# Patient Record
Sex: Female | Born: 1950 | Race: White | Hispanic: No | Marital: Married | Smoking: Current every day smoker
Health system: Southern US, Community
[De-identification: ages and names within clinical notes are randomized; demographics above are authoritative.]

## PROBLEM LIST (undated history)

## (undated) DIAGNOSIS — S3210XA Unspecified fracture of sacrum, initial encounter for closed fracture: Secondary | ICD-10-CM

## (undated) DIAGNOSIS — M4800 Spinal stenosis, site unspecified: Secondary | ICD-10-CM

## (undated) DIAGNOSIS — K219 Gastro-esophageal reflux disease without esophagitis: Secondary | ICD-10-CM

## (undated) DIAGNOSIS — I10 Essential (primary) hypertension: Secondary | ICD-10-CM

## (undated) DIAGNOSIS — J449 Chronic obstructive pulmonary disease, unspecified: Secondary | ICD-10-CM

## (undated) DIAGNOSIS — I639 Cerebral infarction, unspecified: Secondary | ICD-10-CM

## (undated) HISTORY — PX: CHOLECYSTECTOMY: SHX55

## (undated) HISTORY — PX: ABDOMINAL HYSTERECTOMY: SHX81

## (undated) HISTORY — PX: NISSEN FUNDOPLICATION: SHX2091

## (undated) HISTORY — PX: HERNIA REPAIR: SHX51

---

## 1998-10-25 HISTORY — PX: LEFT HEART CATH AND CORONARY ANGIOGRAPHY: CATH118249

## 2004-01-23 DIAGNOSIS — Z79891 Long term (current) use of opiate analgesic: Secondary | ICD-10-CM | POA: Insufficient documentation

## 2004-11-15 ENCOUNTER — Emergency Department: Payer: Self-pay | Admitting: Emergency Medicine

## 2006-08-30 ENCOUNTER — Inpatient Hospital Stay: Payer: Self-pay | Admitting: Internal Medicine

## 2006-08-30 ENCOUNTER — Other Ambulatory Visit: Payer: Self-pay

## 2010-12-01 DIAGNOSIS — K2270 Barrett's esophagus without dysplasia: Secondary | ICD-10-CM | POA: Insufficient documentation

## 2012-02-21 DIAGNOSIS — E860 Dehydration: Secondary | ICD-10-CM | POA: Insufficient documentation

## 2012-11-02 DIAGNOSIS — J449 Chronic obstructive pulmonary disease, unspecified: Secondary | ICD-10-CM | POA: Insufficient documentation

## 2012-11-02 DIAGNOSIS — F17200 Nicotine dependence, unspecified, uncomplicated: Secondary | ICD-10-CM | POA: Insufficient documentation

## 2012-12-04 DIAGNOSIS — R911 Solitary pulmonary nodule: Secondary | ICD-10-CM | POA: Insufficient documentation

## 2013-05-02 DIAGNOSIS — F329 Major depressive disorder, single episode, unspecified: Secondary | ICD-10-CM | POA: Diagnosis present

## 2013-05-02 DIAGNOSIS — F32A Depression, unspecified: Secondary | ICD-10-CM | POA: Diagnosis present

## 2013-11-01 DIAGNOSIS — M533 Sacrococcygeal disorders, not elsewhere classified: Secondary | ICD-10-CM | POA: Insufficient documentation

## 2015-10-18 ENCOUNTER — Inpatient Hospital Stay
Admission: EM | Admit: 2015-10-18 | Discharge: 2015-10-20 | DRG: 190 | Disposition: A | Discharge status: Home / Self Care | Payer: Medicare Other | Attending: Internal Medicine | Admitting: Internal Medicine

## 2015-10-18 ENCOUNTER — Emergency Department: Payer: Medicare Other

## 2015-10-18 ENCOUNTER — Encounter: Payer: Self-pay | Admitting: Emergency Medicine

## 2015-10-18 DIAGNOSIS — J440 Chronic obstructive pulmonary disease with acute lower respiratory infection: Secondary | ICD-10-CM | POA: Diagnosis present

## 2015-10-18 DIAGNOSIS — Z881 Allergy status to other antibiotic agents status: Secondary | ICD-10-CM

## 2015-10-18 DIAGNOSIS — F17210 Nicotine dependence, cigarettes, uncomplicated: Secondary | ICD-10-CM | POA: Diagnosis present

## 2015-10-18 DIAGNOSIS — Z880 Allergy status to penicillin: Secondary | ICD-10-CM

## 2015-10-18 DIAGNOSIS — J441 Chronic obstructive pulmonary disease with (acute) exacerbation: Secondary | ICD-10-CM | POA: Diagnosis present

## 2015-10-18 DIAGNOSIS — J181 Lobar pneumonia, unspecified organism: Secondary | ICD-10-CM

## 2015-10-18 DIAGNOSIS — E876 Hypokalemia: Secondary | ICD-10-CM | POA: Diagnosis present

## 2015-10-18 DIAGNOSIS — Z79899 Other long term (current) drug therapy: Secondary | ICD-10-CM | POA: Diagnosis not present

## 2015-10-18 DIAGNOSIS — J189 Pneumonia, unspecified organism: Secondary | ICD-10-CM | POA: Diagnosis present

## 2015-10-18 DIAGNOSIS — E871 Hypo-osmolality and hyponatremia: Secondary | ICD-10-CM | POA: Diagnosis present

## 2015-10-18 DIAGNOSIS — I10 Essential (primary) hypertension: Secondary | ICD-10-CM | POA: Diagnosis present

## 2015-10-18 HISTORY — DX: Chronic obstructive pulmonary disease, unspecified: J44.9

## 2015-10-18 LAB — LACTIC ACID, PLASMA
LACTIC ACID, VENOUS: 1.1 mmol/L (ref 0.5–2.0)
Lactic Acid, Venous: 1.4 mmol/L (ref 0.5–2.0)

## 2015-10-18 LAB — BASIC METABOLIC PANEL
ANION GAP: 9 (ref 5–15)
BUN: 7 mg/dL (ref 6–20)
CHLORIDE: 93 mmol/L — AB (ref 101–111)
CO2: 30 mmol/L (ref 22–32)
Calcium: 8.3 mg/dL — ABNORMAL LOW (ref 8.9–10.3)
Creatinine, Ser: 0.53 mg/dL (ref 0.44–1.00)
GFR calc non Af Amer: >60 mL/min (ref >60)
Glucose, Bld: 106 mg/dL — ABNORMAL HIGH (ref 65–99)
POTASSIUM: 2.2 mmol/L — AB (ref 3.5–5.1)
SODIUM: 132 mmol/L — AB (ref 135–145)

## 2015-10-18 LAB — CBC WITH DIFFERENTIAL/PLATELET
BASOS PCT: 0 %
Basophils Absolute: 0 10*3/uL (ref 0–0.1)
EOS ABS: 0 10*3/uL (ref 0–0.7)
Eosinophils Relative: 0 %
HCT: 38.1 % (ref 35.0–47.0)
HEMOGLOBIN: 13.1 g/dL (ref 12.0–16.0)
LYMPHS ABS: 2 10*3/uL (ref 1.0–3.6)
Lymphocytes Relative: 17 %
MCH: 31.4 pg (ref 26.0–34.0)
MCHC: 34.3 g/dL (ref 32.0–36.0)
MCV: 91.6 fL (ref 80.0–100.0)
Monocytes Absolute: 1.5 10*3/uL — ABNORMAL HIGH (ref 0.2–0.9)
Monocytes Relative: 13 %
NEUTROS PCT: 70 %
Neutro Abs: 8.5 10*3/uL — ABNORMAL HIGH (ref 1.4–6.5)
Platelets: 571 10*3/uL — ABNORMAL HIGH (ref 150–440)
RBC: 4.16 MIL/uL (ref 3.80–5.20)
RDW: 14.3 % (ref 11.5–14.5)
WBC: 12.1 10*3/uL — AB (ref 3.6–11.0)

## 2015-10-18 LAB — TROPONIN I: TROPONIN I: 0.04 ng/mL — AB (ref <0.031)

## 2015-10-18 LAB — MAGNESIUM: MAGNESIUM: 1.4 mg/dL — AB (ref 1.7–2.4)

## 2015-10-18 MED ORDER — SODIUM CHLORIDE 0.9 % IV BOLUS (SEPSIS)
1000.0000 mL | Freq: Once | INTRAVENOUS | Status: AC
  Administered 2015-10-18: 1000 mL via INTRAVENOUS

## 2015-10-18 MED ORDER — OXYCODONE HCL ER 20 MG PO T12A
20.0000 mg | EXTENDED_RELEASE_TABLET | Freq: Three times a day (TID) | ORAL | Status: DC
  Administered 2015-10-18 – 2015-10-20 (×5): 20 mg via ORAL
  Filled 2015-10-18 (×5): qty 1

## 2015-10-18 MED ORDER — PANTOPRAZOLE SODIUM 40 MG PO TBEC
40.0000 mg | DELAYED_RELEASE_TABLET | Freq: Every day | ORAL | Status: DC
  Administered 2015-10-18 – 2015-10-20 (×3): 40 mg via ORAL
  Filled 2015-10-18 (×3): qty 1

## 2015-10-18 MED ORDER — ALBUTEROL SULFATE HFA 108 (90 BASE) MCG/ACT IN AERS: 2.0000 | INHALATION_SPRAY | Freq: Four times a day (QID) | RESPIRATORY_TRACT | Status: DC | PRN

## 2015-10-18 MED ORDER — NICOTINE 21 MG/24HR TD PT24
21.0000 mg | MEDICATED_PATCH | Freq: Every day | TRANSDERMAL | Status: DC
  Administered 2015-10-18 – 2015-10-20 (×3): 21 mg via TRANSDERMAL
  Filled 2015-10-18 (×3): qty 1

## 2015-10-18 MED ORDER — METHYLPREDNISOLONE SODIUM SUCC 125 MG IJ SOLR
60.0000 mg | Freq: Two times a day (BID) | INTRAMUSCULAR | Status: DC
  Administered 2015-10-18 – 2015-10-19 (×3): 60 mg via INTRAVENOUS
  Filled 2015-10-18 (×4): qty 2

## 2015-10-18 MED ORDER — POTASSIUM CHLORIDE 10 MEQ/100ML IV SOLN
10.0000 meq | INTRAVENOUS | Status: AC
  Administered 2015-10-18 (×4): 10 meq via INTRAVENOUS
  Filled 2015-10-18 (×5): qty 100

## 2015-10-18 MED ORDER — ENOXAPARIN SODIUM 40 MG/0.4ML ~~LOC~~ SOLN
40.0000 mg | SUBCUTANEOUS | Status: DC
  Administered 2015-10-18 – 2015-10-19 (×2): 40 mg via SUBCUTANEOUS
  Filled 2015-10-18 (×2): qty 0.4

## 2015-10-18 MED ORDER — AMLODIPINE BESYLATE 5 MG PO TABS
2.5000 mg | ORAL_TABLET | Freq: Every day | ORAL | Status: DC
  Administered 2015-10-19 – 2015-10-20 (×2): 2.5 mg via ORAL
  Filled 2015-10-18 (×3): qty 1

## 2015-10-18 MED ORDER — ONDANSETRON HCL 4 MG/2ML IJ SOLN
4.0000 mg | Freq: Once | INTRAMUSCULAR | Status: AC
  Administered 2015-10-18: 4 mg via INTRAVENOUS
  Filled 2015-10-18: qty 2

## 2015-10-18 MED ORDER — LEVOFLOXACIN 500 MG PO TABS: 750.0000 mg | ORAL_TABLET | Freq: Two times a day (BID) | ORAL | Status: DC

## 2015-10-18 MED ORDER — SODIUM CHLORIDE 0.9 % IV SOLN
INTRAVENOUS | Status: AC
  Administered 2015-10-18 – 2015-10-19 (×2): via INTRAVENOUS

## 2015-10-18 MED ORDER — LEVOFLOXACIN IN D5W 750 MG/150ML IV SOLN
750.0000 mg | Freq: Once | INTRAVENOUS | Status: AC
  Administered 2015-10-18: 750 mg via INTRAVENOUS
  Filled 2015-10-18: qty 150

## 2015-10-18 MED ORDER — IPRATROPIUM-ALBUTEROL 0.5-2.5 (3) MG/3ML IN SOLN
3.0000 mL | Freq: Four times a day (QID) | RESPIRATORY_TRACT | Status: DC
  Administered 2015-10-19 (×4): 3 mL via RESPIRATORY_TRACT
  Filled 2015-10-18 (×4): qty 3

## 2015-10-18 MED ORDER — POTASSIUM CHLORIDE 10 MEQ/100ML IV SOLN
10.0000 meq | Freq: Once | INTRAVENOUS | Status: AC
  Administered 2015-10-18: 10 meq via INTRAVENOUS
  Filled 2015-10-18: qty 100

## 2015-10-18 MED ORDER — POTASSIUM CHLORIDE CRYS ER 20 MEQ PO TBCR
40.0000 meq | EXTENDED_RELEASE_TABLET | Freq: Once | ORAL | Status: AC
  Administered 2015-10-18: 40 meq via ORAL
  Filled 2015-10-18: qty 2

## 2015-10-18 MED ORDER — POTASSIUM CHLORIDE 20 MEQ PO PACK
40.0000 meq | PACK | Freq: Once | ORAL | Status: DC
  Filled 2015-10-18: qty 2

## 2015-10-18 MED ORDER — POTASSIUM CHLORIDE CRYS ER 20 MEQ PO TBCR
EXTENDED_RELEASE_TABLET | ORAL | Status: AC
  Filled 2015-10-18: qty 2

## 2015-10-18 MED ORDER — GABAPENTIN 300 MG PO CAPS
600.0000 mg | ORAL_CAPSULE | Freq: Three times a day (TID) | ORAL | Status: DC
  Administered 2015-10-18 – 2015-10-20 (×5): 600 mg via ORAL
  Filled 2015-10-18 (×5): qty 2

## 2015-10-18 MED ORDER — MAGNESIUM SULFATE 2 GM/50ML IV SOLN
2.0000 g | Freq: Once | INTRAVENOUS | Status: AC
  Administered 2015-10-18: 2 g via INTRAVENOUS
  Filled 2015-10-18: qty 50

## 2015-10-18 MED ORDER — ONDANSETRON HCL 4 MG/2ML IJ SOLN
4.0000 mg | Freq: Four times a day (QID) | INTRAMUSCULAR | Status: DC | PRN
  Administered 2015-10-18 – 2015-10-20 (×3): 4 mg via INTRAVENOUS
  Filled 2015-10-18 (×4): qty 2

## 2015-10-18 MED ORDER — ZOLPIDEM TARTRATE 5 MG PO TABS
5.0000 mg | ORAL_TABLET | Freq: Once | ORAL | Status: AC
  Administered 2015-10-18: 5 mg via ORAL
  Filled 2015-10-18: qty 1

## 2015-10-18 MED ORDER — HYDROCHLOROTHIAZIDE 25 MG PO TABS
25.0000 mg | ORAL_TABLET | Freq: Every day | ORAL | Status: DC
  Administered 2015-10-19 – 2015-10-20 (×2): 25 mg via ORAL
  Filled 2015-10-18 (×3): qty 1

## 2015-10-18 MED ORDER — ASPIRIN 81 MG PO CHEW
324.0000 mg | CHEWABLE_TABLET | Freq: Once | ORAL | Status: AC
  Administered 2015-10-18: 324 mg via ORAL
  Filled 2015-10-18: qty 4

## 2015-10-18 MED ORDER — POTASSIUM CHLORIDE CRYS ER 20 MEQ PO TBCR
40.0000 meq | EXTENDED_RELEASE_TABLET | Freq: Once | ORAL | Status: AC
  Administered 2015-10-18: 40 meq via ORAL

## 2015-10-18 MED ORDER — MONTELUKAST SODIUM 10 MG PO TABS
10.0000 mg | ORAL_TABLET | ORAL | Status: DC
  Administered 2015-10-19 – 2015-10-20 (×2): 10 mg via ORAL
  Filled 2015-10-18 (×2): qty 1

## 2015-10-18 MED ORDER — LEVOFLOXACIN IN D5W 500 MG/100ML IV SOLN
500.0000 mg | INTRAVENOUS | Status: DC
  Administered 2015-10-19: 18:00:00 500 mg via INTRAVENOUS
  Filled 2015-10-18 (×2): qty 100

## 2015-10-18 MED ORDER — ALBUTEROL SULFATE (2.5 MG/3ML) 0.083% IN NEBU
2.5000 mg | INHALATION_SOLUTION | RESPIRATORY_TRACT | Status: DC | PRN
  Administered 2015-10-20: 06:00:00 2.5 mg via RESPIRATORY_TRACT
  Filled 2015-10-18: qty 3

## 2015-10-18 MED ORDER — HYDROCODONE-ACETAMINOPHEN 7.5-325 MG PO TABS
1.0000 | ORAL_TABLET | Freq: Four times a day (QID) | ORAL | Status: DC
  Administered 2015-10-18 – 2015-10-20 (×6): 1 via ORAL
  Filled 2015-10-18 (×6): qty 1

## 2015-10-18 MED ORDER — TIOTROPIUM BROMIDE MONOHYDRATE 18 MCG IN CAPS
18.0000 ug | ORAL_CAPSULE | Freq: Every day | RESPIRATORY_TRACT | Status: DC
  Administered 2015-10-19 – 2015-10-20 (×2): 18 ug via RESPIRATORY_TRACT
  Filled 2015-10-18: qty 5

## 2015-10-18 NOTE — Progress Notes (Signed)
ANTIBIOTIC CONSULT NOTE - INITIAL  Pharmacy Consult for Levaquin  Indication: CAP   Allergies  Allergen Reactions  . Ciprofloxacin Other (See Comments)    Altered mental status.  Marland Kitchen. Penicillins Other (See Comments)    Unknown reaction    Patient Measurements: Height: 5\' 4"  (162.6 cm) Weight: 115 lb (52.164 kg) IBW/kg (Calculated) : 54.7 Adjusted Body Weight:   Vital Signs: Temp: 97.8 F (36.6 C) (12/24 1112) Temp Source: Oral (12/24 1112) BP: 151/81 mmHg (12/24 1930) Pulse Rate: 90 (12/24 1930) Intake/Output from previous day:   Intake/Output from this shift:    Labs:  Recent Labs  10/18/15 1237  WBC 12.1*  HGB 13.1  PLT 571*  CREATININE 0.53   Estimated Creatinine Clearance: 58.5 mL/min (by C-G formula based on Cr of 0.53). No results for input(s): VANCOTROUGH, VANCOPEAK, VANCORANDOM, GENTTROUGH, GENTPEAK, GENTRANDOM, TOBRATROUGH, TOBRAPEAK, TOBRARND, AMIKACINPEAK, AMIKACINTROU, AMIKACIN in the last 72 hours.   Microbiology: No results found for this or any previous visit (from the past 720 hour(s)).  Medical History: Past Medical History  Diagnosis Date  . COPD (chronic obstructive pulmonary disease) (HCC)     Medications:  Scheduled:  . ipratropium-albuterol  3 mL Nebulization Q6H  . nicotine  21 mg Transdermal Daily   Assessment: Pharmacy consulted to dose levaquin in this 64 year old female admitted with CAP.  CrCl = 58.5 ml/min  Goal of Therapy:  resolution of infection  Plan:  Expected duration 7 days with resolution of temperature and/or normalization of WBC   Levaquin 750 mg IV X 1 given on 12/24. Levaquin 500 mg IV Q24H ordered to start 12/25.  No dose adjustment needed.  Lincy Belles D 10/18/2015,8:09 PM

## 2015-10-18 NOTE — ED Notes (Signed)
Pt here with c/o sob for a few days now, coughing with yellow sputum "with black spots in it", pt with labored breathing, sats 91% on ra, pt states her norm is around 90%.

## 2015-10-18 NOTE — H&P (Signed)
[facility] [place] at [facility]   PATIENT NAME: Pam Johnson    MR#:  [id number]  DATE OF BIRTH:  1951/06/13  DATE OF ADMISSION:  10/18/2015  PRIMARY CARE PHYSICIAN: No primary care provider on file.   REQUESTING/REFERRING PHYSICIAN: Dr. Shaune Pollack  CHIEF COMPLAINT:   Shortness of breath HISTORY OF PRESENT ILLNESS:  Pam Johnson  is a 64 y.o. female with a known history of COPD and still continues to smoke is presenting to the ED with a chief complaint of shortness of breath. Patient is having productive cough with yellow phlegm since Tuesday. Denies any fever or dizziness. Denies any sick contacts. Came into the ED chest x-ray has revealed right upper lobe pneumonia. Patient is started on IV levofloxacin and hospitalist team is called to admit the patient. Patient's potassium is at 2.2 and magnesium is at 1.4. Patient has received 10 mEq of IV potassium chloride and 40 mg of by mouth potassium in the ED. Denies any chest pain during my examination. No similar complaints in the past. EKG with no acute changes  PAST MEDICAL HISTORY:   Past Medical History  Diagnosis Date  . COPD (chronic obstructive pulmonary disease) (HCC)     PAST SURGICAL HISTOIRY:   Past Surgical History  Procedure Laterality Date  . Cholecystectomy    . Hernia repair    . Abdominal hysterectomy      SOCIAL HISTORY:   Social History  Substance Use Topics  . Smoking status: Current Every Day Smoker -- 1.00 packs/day    Types: Cigarettes  . Smokeless tobacco: Not on file  . Alcohol Use: No    FAMILY HISTORY:  No family history on file.  DRUG ALLERGIES:   Allergies  Allergen Reactions  . Ciprofloxacin Other (See Comments)    Altered mental status.  Marland Kitchen Penicillins Other (See Comments)    Unknown reaction    REVIEW OF SYSTEMS:  CONSTITUTIONAL: No fever, fatigue or weakness.  EYES: No blurred or double vision.  EARS, NOSE, AND THROAT: No tinnitus or ear pain.   RESPIRATORY: Reports productive cough, shortness of breath,  wheezing, denies   hemoptysis.  CARDIOVASCULAR: No chest pain, orthopnea, edema.  GASTROINTESTINAL: No nausea, vomiting, diarrhea or abdominal pain.  GENITOURINARY: No dysuria, hematuria.  ENDOCRINE: No polyuria, nocturia,  HEMATOLOGY: No anemia, easy bruising or bleeding SKIN: No rash or lesion. MUSCULOSKELETAL: No joint pain or arthritis.   NEUROLOGIC: No tingling, numbness, weakness.  PSYCHIATRY: No anxiety or depression.   MEDICATIONS AT HOME:   Prior to Admission medications   Medication Sig Start Date End Date Taking? Authorizing Provider  albuterol (PROVENTIL HFA;VENTOLIN HFA) 108 (90 BASE) MCG/ACT inhaler Inhale 2 puffs into the lungs every 6 (six) hours as needed for wheezing or shortness of breath.   Yes Historical Provider, MD  amLODipine (NORVASC) 2.5 MG tablet Take 2.5 mg by mouth daily.   Yes Historical Provider, MD  esomeprazole (NEXIUM) 40 MG capsule Take 40 mg by mouth 2 (two) times daily.   Yes Historical Provider, MD  Fluticasone-Salmeterol (ADVAIR) 500-50 MCG/DOSE AEPB Inhale 1 puff into the lungs 2 (two) times daily.   Yes Historical Provider, MD  gabapentin (NEURONTIN) 600 MG tablet Take 600 mg by mouth 3 (three) times daily.   Yes Historical Provider, MD  hydrochlorothiazide (HYDRODIURIL) 25 MG tablet Take 25 mg by mouth daily.   Yes Historical Provider, MD  HYDROcodone-acetaminophen (NORCO) 7.5-325 MG tablet Take 1 tablet by mouth 4 (four) times daily.  Yes Historical Provider, MD  montelukast (SINGULAIR) 10 MG tablet Take 10 mg by mouth every morning.   Yes Historical Provider, MD  oxyCODONE (OXYCONTIN) 20 mg 12 hr tablet Take 20 mg by mouth 3 (three) times daily.   Yes Historical Provider, MD  tiotropium (SPIRIVA) 18 MCG inhalation capsule Place 18 mcg into inhaler and inhale daily.   Yes Historical Provider, MD  levofloxacin (LEVAQUIN) 500 MG tablet Take 1.5 tablets (750 mg total) by mouth 2 (two)  times daily. 10/18/15   Governor Rooksebecca Lord, MD      VITAL SIGNS:  Blood pressure 154/84, pulse 91, temperature 97.8 F (36.6 C), temperature source Oral, resp. rate 13, height 5\' 4"  (1.626 m), weight 52.164 kg (115 lb), SpO2 92 %.  PHYSICAL EXAMINATION:  GENERAL:  64 y.o.-year-old patient lying in the bed with no acute distress.  EYES: Pupils equal, round, reactive to light and accommodation. No scleral icterus. Extraocular muscles intact.  HEENT: Head atraumatic, normocephalic. Oropharynx and nasopharynx clear.  NECK:  Supple, no jugular venous distention. No thyroid enlargement, no tenderness.  LUNGS:  moderate breath sounds bilaterally, right upper lobe crackling,  diffuse  wheezing,  No rales,rhonchi or crepitation. No use of accessory muscles of respiration.  CARDIOVASCULAR: S1, S2 normal. No murmurs, rubs, or gallops.  ABDOMEN: Soft, nontender, nondistended. Bowel sounds present. No organomegaly or mass.  EXTREMITIES: No pedal edema, cyanosis, or clubbing.  NEUROLOGIC: Cranial nerves II through XII are intact. Muscle strength 5/5 in all extremities. Sensation intact. Gait not checked.  PSYCHIATRIC: The patient is alert and oriented x 3.  SKIN: No obvious rash, lesion, or ulcer.   LABORATORY PANEL:   CBC  Recent Labs Lab 10/18/15 1237  WBC 12.1*  HGB 13.1  HCT 38.1  PLT 571*   ------------------------------------------------------------------------------------------------------------------  Chemistries   Recent Labs Lab 10/18/15 1237  NA 132*  K 2.2*  CL 93*  CO2 30  GLUCOSE 106*  BUN 7  CREATININE 0.53  CALCIUM 8.3*  MG 1.4*   ------------------------------------------------------------------------------------------------------------------  Cardiac Enzymes  Recent Labs Lab 10/18/15 1237  TROPONINI 0.04*   ------------------------------------------------------------------------------------------------------------------  RADIOLOGY:  Dg Chest 2  View  10/18/2015  CLINICAL DATA:  Smoker with productive cough for 5 days. EXAM: CHEST  2 VIEW COMPARISON:  None. FINDINGS: Normal cardiac silhouette. Lungs are hyperinflated. There is segmental airspace disease in the RIGHT upper lobe, lateral segment. Lungs are hyperinflated. IMPRESSION: RIGHT upper lobe pneumonia. Electronically Signed   By: Genevive BiStewart  Edmunds M.D.   On: 10/18/2015 11:59    EKG:   Orders placed or performed during the hospital encounter of 10/18/15  . ED EKG  . ED EKG  . EKG 12-Lead  . EKG 12-Lead   EKG with no U waves, no acute ST-T wave depressions or elevations IMPRESSION AND PLAN:   1. Shortness of breath with productive cough secondary to right upper lobe pneumonia which is community-acquired Admit patient to telemetry in view of severe hypokalemia We will obtain sputum culture and sensitivity Check urine for Legionella antigen and strep pneumonia Patient will be given IV levofloxacin and nebulizer treatments   2. Mild COPD exacerbation probably triggered by right upper lobe pneumonia Will provide small dose IV Solu-Medrol and neb laser treatments.  3. Severe hypokalemia and hypomagnesemia Replete potassium and magnesium and check a.m. Labs  4. Tobacco abuse disorder  Counseled patient to quit smoking for 3-4 minutes. Patient verbalized understanding. She is ready to quit. Provide nicotine patch.  Will provide GI and DVT prophylaxis  All the records are reviewed and case discussed with ED provider. Management plans discussed with the patient and she is in agreement.  CODE STATUS: Full code, husband healthcare power of attorney  TOTAL TIME TAKING CARE OF THIS PATIENT: 45 minutes.    Ramonita Lab M.D on 10/18/2015 at 6:29 PM  Between 7am to 6pm - Pager - [phone]  After 6pm go to [email or web address] - password [email or web address] [facility]  [place] [facility]  Office  [phone]  CC: Primary care physician; No primary care provider on file.

## 2015-10-18 NOTE — ED Notes (Signed)
Patient transported to X-ray 

## 2015-10-18 NOTE — ED Provider Notes (Signed)
[facility] Emergency Department Provider Note   ____________________________________________  Time seen:  I have reviewed the triage vital signs and the triage nursing note.  HISTORY  Chief Complaint Shortness of Breath   Historian Patient and spouse  HPI Pam Johnson is a 64 y.o. female with a history of COPD, baseline O2 sat per the patient of around 90%, who is here with a complaint of coughing and productive yellow sputum since Tuesday. No fevers reported. No dizziness or lightheadedness. She has had shortness of breath. She does use albuterol and inhaled steroid at home. Her primary care physician is at [facility]. Symptoms are moderate. No chest pain. Exertion makes her dyspnea worse.    Past Medical History  Diagnosis Date  . COPD (chronic obstructive pulmonary disease) (HCC)     There are no active problems to display for this patient.   Past Surgical History  Procedure Laterality Date  . Cholecystectomy    . Hernia repair    . Abdominal hysterectomy      Current Outpatient Rx  Name  Route  Sig  Dispense  Refill  . levofloxacin (LEVAQUIN) 500 MG tablet   Oral   Take 1.5 tablets (750 mg total) by mouth 2 (two) times daily.   18 tablet   0     Allergies Review of patient's allergies indicates not on file.  No family history on file.  Social History Social History  Substance Use Topics  . Smoking status: Current Every Day Smoker -- 1.00 packs/day    Types: Cigarettes  . Smokeless tobacco: None  . Alcohol Use: No    Review of Systems  Constitutional: Negative for fever. Eyes: Negative for visual changes. ENT: Negative for sore throat. Cardiovascular: Negative for chest pain. Respiratory: Positive for shortness of breath. Gastrointestinal: Negative for abdominal pain, vomiting and diarrhea. Genitourinary: Negative for dysuria. Musculoskeletal: Negative for back pain. Skin: Negative for rash. Neurological:  Negative for headache. 10 point Review of Systems otherwise negative ____________________________________________   PHYSICAL EXAM:  VITAL SIGNS: ED Triage Vitals  Enc Vitals Group     BP 10/18/15 1112 130/83 mmHg     Pulse Rate 10/18/15 1112 70     Resp 10/18/15 1112 18     Temp 10/18/15 1112 97.8 F (36.6 C)     Temp Source 10/18/15 1112 Oral     SpO2 10/18/15 1112 91 %     Weight 10/18/15 1112 115 lb (52.164 kg)     Height 10/18/15 1112  (1.626 m)     Head Cir --      Peak Flow --      Pain Score --      Pain Loc --      Pain Edu? --      Excl. in GC? --      Constitutional: Alert and oriented. Well appearing and in no distress. Eyes: Conjunctivae are normal. PERRL. Normal extraocular movements. ENT   Head: Normocephalic and atraumatic.   Nose: No congestion/rhinnorhea.   Mouth/Throat: Mucous membranes are moist.   Neck: No stridor. Cardiovascular/Chest: Normal rate, regular rhythm.  No murmurs, rubs, or gallops. Respiratory: Normal respiratory effort without tachypnea nor retractions. Rhonchi right posterior upper and middle.  Gastrointestinal: Soft. No distention, no guarding, no rebound. Nontender  Genitourinary/rectal:Deferred Musculoskeletal: Nontender with normal range of motion in all extremities. No joint effusions.  No lower extremity tenderness.  No edema. Neurologic:  Normal speech and language. No gross or focal neurologic  deficits are appreciated. Skin:  Skin is warm, dry and intact. No rash noted. Psychiatric: Mood and affect are normal. Speech and behavior are normal. Patient exhibits appropriate insight and judgment.  ____________________________________________   EKG I, Governor Rooksebecca Estellar Cadena, MD, the attending physician have personally viewed and interpreted all ECGs.  91 bpm. Normal sinus rhythm with PVCs. Narrow QRS. Normal axis. Nonspecific ST and T-wave. ____________________________________________  LABS (pertinent  positives/negatives)  Lactic acid 1.1 White blood count 12.1 with left shift. Hemoglobin 13.1 and platelet count 571 Basic metabolic panel significant for sodium 132, potassium 2.2, chloride 93 Troponin 0.04  ____________________________________________  RADIOLOGY All Xrays were viewed by me. Imaging interpreted by Radiologist.  Chest two-view: Right upper lobe pneumonia __________________________________________  PROCEDURES  Procedure(s) performed: None  Critical Care performed: None  ____________________________________________   ED COURSE / ASSESSMENT AND PLAN  CONSULTATIONS: Hospitalist for admission  Pertinent labs & imaging results that were available during my care of the patient were reviewed by me and considered in my medical decision making (see chart for details).  O2 sat is between 90 and 94% at rest.  Per the patient this is her baseline O2 sat, but she feels short of breath and wheezy especially with exertion.  No fever here. Chest x-ray does show right upper lobe pneumonia, and this fits her clinical lung findings on exam.   On laboratory eval, she does have hyponatremia, hypokalemia and minimally elevated troponin.  She was given aspirin, ns bolus, and potassium po and iv.  I will have patient admitted for observation and treatment of pneumonia with electrolyte abnormalities.  Patient / Family / Caregiver informed of clinical course, medical decision-making process, and agree with plan.   I discussed return precautions, follow-up instructions, and discharged instructions with patient and/or family.  ___________________________________________   FINAL CLINICAL IMPRESSION(S) / ED DIAGNOSES   Final diagnoses:  Right upper lobe pneumonia  Hypokalemia  Hyponatremia       Governor Rooksebecca Bretton Tandy, MD 10/18/15 1456

## 2015-10-19 LAB — BASIC METABOLIC PANEL
Anion gap: 6 (ref 5–15)
BUN: <5 mg/dL — ABNORMAL LOW (ref 6–20)
CHLORIDE: 107 mmol/L (ref 101–111)
CO2: 25 mmol/L (ref 22–32)
Calcium: 7.6 mg/dL — ABNORMAL LOW (ref 8.9–10.3)
Creatinine, Ser: 0.36 mg/dL — ABNORMAL LOW (ref 0.44–1.00)
GFR calc non Af Amer: >60 mL/min (ref >60)
Glucose, Bld: 126 mg/dL — ABNORMAL HIGH (ref 65–99)
POTASSIUM: 3.6 mmol/L (ref 3.5–5.1)
SODIUM: 138 mmol/L (ref 135–145)

## 2015-10-19 LAB — RAPID HIV SCREEN (HIV 1/2 AB+AG)
HIV 1/2 ANTIBODIES: NONREACTIVE
HIV-1 P24 Antigen - HIV24: NONREACTIVE

## 2015-10-19 LAB — CBC
HEMATOCRIT: 35.9 % (ref 35.0–47.0)
Hemoglobin: 12.4 g/dL (ref 12.0–16.0)
MCH: 32.9 pg (ref 26.0–34.0)
MCHC: 34.7 g/dL (ref 32.0–36.0)
MCV: 94.9 fL (ref 80.0–100.0)
PLATELETS: 571 10*3/uL — AB (ref 150–440)
RBC: 3.78 MIL/uL — AB (ref 3.80–5.20)
RDW: 14.2 % (ref 11.5–14.5)
WBC: 9.1 10*3/uL (ref 3.6–11.0)

## 2015-10-19 LAB — EXPECTORATED SPUTUM ASSESSMENT W REFEX TO RESP CULTURE

## 2015-10-19 LAB — EXPECTORATED SPUTUM ASSESSMENT W GRAM STAIN, RFLX TO RESP C

## 2015-10-19 LAB — MAGNESIUM: Magnesium: 1.9 mg/dL (ref 1.7–2.4)

## 2015-10-19 LAB — STREP PNEUMONIAE URINARY ANTIGEN: STREP PNEUMO URINARY ANTIGEN: NEGATIVE

## 2015-10-19 MED ORDER — ZOLPIDEM TARTRATE 5 MG PO TABS
5.0000 mg | ORAL_TABLET | Freq: Every evening | ORAL | Status: DC | PRN
  Administered 2015-10-19: 23:00:00 5 mg via ORAL
  Filled 2015-10-19: qty 1

## 2015-10-19 MED ORDER — MOMETASONE FURO-FORMOTEROL FUM 100-5 MCG/ACT IN AERO
2.0000 | INHALATION_SPRAY | Freq: Two times a day (BID) | RESPIRATORY_TRACT | Status: DC
  Administered 2015-10-19 – 2015-10-20 (×2): 2 via RESPIRATORY_TRACT
  Filled 2015-10-19: qty 8.8

## 2015-10-19 NOTE — Progress Notes (Signed)
[facility] - [place] at [facility]   PATIENT NAME: Pam Johnson    MR#:  [id number]  DATE OF BIRTH:  [birth year]  SUBJECTIVE:  CHIEF COMPLAINT:   Chief Complaint  Patient presents with  . Shortness of Breath   Is nauseated and weak. Unable to tolerate food and drink  REVIEW OF SYSTEMS:   Review of Systems  Constitutional: Positive for fever, chills and malaise/fatigue.  Respiratory: Positive for cough, sputum production, shortness of breath and wheezing.   Cardiovascular: Negative for chest pain and palpitations.  Gastrointestinal: Positive for nausea. Negative for vomiting and abdominal pain.  Genitourinary: Negative for dysuria.    DRUG ALLERGIES:   Allergies  Allergen Reactions  . Ciprofloxacin Other (See Comments)    Altered mental status.  Marland Kitchen Penicillins Other (See Comments)    Unknown reaction    VITALS:  Blood pressure 160/88, pulse 74, temperature 97.9 F (36.6 C), temperature source Oral, resp. rate 16, height  (1.626 m), weight 52.3 kg (115 lb 4.8 oz), SpO2 92 %.  PHYSICAL EXAMINATION:  GENERAL:  64 y.o.-year-old sitting in chair, ill-appearing LUNGS: Diffuse crackles and wheezes, fair air movement, no use of accessory muscles  CARDIOVASCULAR: S1, S2 normal. No murmurs, rubs, or gallops.  ABDOMEN: Soft, nontender, nondistended. Bowel sounds present. No organomegaly or mass.  EXTREMITIES: No pedal edema, cyanosis, or clubbing. Pulses 1+ NEUROLOGIC: Cranial nerves II through XII are intact. Muscle strength 5/5 in all extremities. Sensation intact. Gait not checked.  PSYCHIATRIC: The patient is alert and oriented x 3.  SKIN: No obvious rash, lesion, or ulcer.    LABORATORY PANEL:   CBC  Recent Labs Lab 10/19/15 0508  WBC 9.1  HGB 12.4  HCT 35.9  PLT 571*   ------------------------------------------------------------------------------------------------------------------  Chemistries   Recent Labs Lab  10/19/15 0508  NA 138  K 3.6  CL 107  CO2 25  GLUCOSE 126*  BUN <5*  CREATININE 0.36*  CALCIUM 7.6*  MG 1.9   ------------------------------------------------------------------------------------------------------------------  Cardiac Enzymes  Recent Labs Lab 10/18/15 1237  TROPONINI 0.04*   ------------------------------------------------------------------------------------------------------------------  RADIOLOGY:  Dg Chest 2 View  10/18/2015  CLINICAL DATA:  Smoker with productive cough for 5 days. EXAM: CHEST  2 VIEW COMPARISON:  None. FINDINGS: Normal cardiac silhouette. Lungs are hyperinflated. There is segmental airspace disease in the RIGHT upper lobe, lateral segment. Lungs are hyperinflated. IMPRESSION: RIGHT upper lobe pneumonia. Electronically Signed   By: Genevive Bi M.D.   On: 10/18/2015 11:59    EKG:   Orders placed or performed during the hospital encounter of 10/18/15  . ED EKG  . ED EKG  . EKG 12-Lead  . EKG 12-Lead    ASSESSMENT AND PLAN:   1. Right upper lobe pneumonia, community-acquired - Ultra data pending - Continue Levaquin and nebulizer  2. COPD with mild exacerbation - Into new nebulizers, breathing well though requiring 2 L  3. Hypokalemia hypomagnesemia - Into need to monitor and recheck as needed  4. Nausea - Likely due to #1. Clear diet, Zofran  5. Tobacco abuse: Counseling provided daily  6. DVT prophylaxis Lovenox  7. Hypertension - Controlled continue HCTZ,   All the records are reviewed and case discussed with Care Management/Social Workerr. Management plans discussed with the patient, family and they are in agreement.  CODE STATUS: Full  TOTAL TIME TAKING CARE OF THIS PATIENT: 25 minutes.  Greater than 50% of time spent in care coordination and counseling. POSSIBLE D/C IN 1 DAYS, DEPENDING  ON CLINICAL CONDITION.   Elby ShowersWALSH, CATHERINE M.D on 10/19/2015 at 1:22 PM  Between 7am to 6pm - Pager -  [phone]  After 6pm go to [email or web address] - password [email or web address]  [place] [facility]  Office  [phone]  CC: Primary care physician; No primary care provider on file.

## 2015-10-19 NOTE — Plan of Care (Signed)
Problem: Activity: Goal: Ability to tolerate increased activity will improve Outcome: Progressing oob to chair with assist. tol well. 02 cont.  Scheduled meds for pain.resp status improving.  Problem: Education: Goal: Knowledge of disease or condition will improve Outcome: Progressing Aware  Of home meds started advair inhaler  As pt takes at home.   Problem: Respiratory: Goal: Respiratory status will improve Outcome: Progressing 02 cont. See above note

## 2015-10-20 LAB — CBC
HCT: 34.6 % — ABNORMAL LOW (ref 35.0–47.0)
Hemoglobin: 12 g/dL (ref 12.0–16.0)
MCH: 31.8 pg (ref 26.0–34.0)
MCHC: 34.5 g/dL (ref 32.0–36.0)
MCV: 92.1 fL (ref 80.0–100.0)
PLATELETS: 578 10*3/uL — AB (ref 150–440)
RBC: 3.76 MIL/uL — AB (ref 3.80–5.20)
RDW: 14 % (ref 11.5–14.5)
WBC: 5.1 10*3/uL (ref 3.6–11.0)

## 2015-10-20 LAB — BASIC METABOLIC PANEL
Anion gap: 5 (ref 5–15)
CO2: 29 mmol/L (ref 22–32)
Calcium: 7.9 mg/dL — ABNORMAL LOW (ref 8.9–10.3)
Chloride: 101 mmol/L (ref 101–111)
Glucose, Bld: 153 mg/dL — ABNORMAL HIGH (ref 65–99)
Potassium: 3.2 mmol/L — ABNORMAL LOW (ref 3.5–5.1)
SODIUM: 135 mmol/L (ref 135–145)

## 2015-10-20 LAB — MAGNESIUM: MAGNESIUM: 1.9 mg/dL (ref 1.7–2.4)

## 2015-10-20 MED ORDER — PREDNISONE 10 MG (21) PO TBPK: 10.0000 mg | ORAL_TABLET | Freq: Every day | ORAL | Status: DC

## 2015-10-20 MED ORDER — IPRATROPIUM-ALBUTEROL 0.5-2.5 (3) MG/3ML IN SOLN
3.0000 mL | Freq: Four times a day (QID) | RESPIRATORY_TRACT | Status: DC
Start: 2015-10-20 — End: 2018-04-01

## 2015-10-20 MED ORDER — POTASSIUM CHLORIDE CRYS ER 20 MEQ PO TBCR
40.0000 meq | EXTENDED_RELEASE_TABLET | Freq: Once | ORAL | Status: AC
  Administered 2015-10-20: 40 meq via ORAL
  Filled 2015-10-20: qty 2

## 2015-10-20 MED ORDER — NICOTINE 21 MG/24HR TD PT24: 21.0000 mg | MEDICATED_PATCH | Freq: Every day | TRANSDERMAL | Status: DC

## 2015-10-20 MED ORDER — LEVOFLOXACIN 500 MG PO TABS: 500.0000 mg | ORAL_TABLET | Freq: Every day | ORAL | Status: DC

## 2015-10-20 NOTE — Discharge Summary (Signed)
[facility] [place] [facility]  DISCHARGE SUMMARY   PATIENT NAME: Lulu RidingConstance Novell    MR#:  [id number]  DATE OF BIRTH:  02/16/1951  DATE OF ADMISSION:  10/18/2015 ADMITTING PHYSICIAN: Ramonita LabAruna Gouru, MD  DATE OF DISCHARGE: 10/20/2015  PRIMARY CARE PHYSICIAN: No primary care provider on file.    ADMISSION DIAGNOSIS:  Hypokalemia [E87.6] Hyponatremia [E87.1] Right upper lobe pneumonia [J18.9]  DISCHARGE DIAGNOSIS:  Active Problems:   Pneumonia   SECONDARY DIAGNOSIS:   Past Medical History  Diagnosis Date  . COPD (chronic obstructive pulmonary disease) (HCC)     HOSPITAL COURSE:    1. Right upper lobe pneumonia, community-acquired: Doing well on levaquin.  Feeling much better this morning. Culture data negative.  2. COPD with mild exacerbation: No oxygen requirement this morning. No wheezing. Continue penicillin taper, antibiotics, nebulizers.  3. Hypokalemia hypomagnesemia: Replaced.  4. Nausea: Resolved. Likely due to pneumonia. Now tolerating regular diet.   5. Tobacco abuse: Counseling provided daily  6. Hypertension - Controlled continue HCTZ,  DISCHARGE CONDITIONS:   Stable  CONSULTS OBTAINED:     None  DRUG ALLERGIES:   Allergies  Allergen Reactions  . Ciprofloxacin Other (See Comments)    Altered mental status.  Marland Kitchen. Penicillins Other (See Comments)    Unknown reaction    DISCHARGE MEDICATIONS:   Discharge Medication List as of 10/20/2015  7:37 AM    START taking these medications   Details  ipratropium-albuterol (DUONEB) 0.5-2.5 (3) MG/3ML SOLN Take 3 mLs by nebulization every 6 (six) hours., Starting 10/20/2015, Until Discontinued, Print    levofloxacin (LEVAQUIN) 500 MG tablet Take 1 tablet (500 mg total) by mouth daily., Starting 10/20/2015, Until Discontinued, Print    nicotine (NICODERM CQ - DOSED IN MG/24 HOURS) 21 mg/24hr patch Place 1 patch (21 mg total) onto the skin daily., Starting 10/20/2015, Until  Discontinued, Normal    predniSONE (STERAPRED UNI-PAK 21 TAB) 10 MG (21) TBPK tablet Take 1 tablet (10 mg total) by mouth daily. Take 60 mg on day one. Decrease by 10 mg daily (50, 40, 30, 20, 10) and then stop., Starting 10/20/2015, Until Discontinued, Print      CONTINUE these medications which have NOT CHANGED   Details  albuterol (PROVENTIL HFA;VENTOLIN HFA) 108 (90 BASE) MCG/ACT inhaler Inhale 2 puffs into the lungs every 6 (six) hours as needed for wheezing or shortness of breath., Until Discontinued, Historical Med    amLODipine (NORVASC) 2.5 MG tablet Take 2.5 mg by mouth daily., Until Discontinued, Historical Med    esomeprazole (NEXIUM) 40 MG capsule Take 40 mg by mouth 2 (two) times daily., Until Discontinued, Historical Med    Fluticasone-Salmeterol (ADVAIR) 500-50 MCG/DOSE AEPB Inhale 1 puff into the lungs 2 (two) times daily., Until Discontinued, Historical Med    gabapentin (NEURONTIN) 600 MG tablet Take 600 mg by mouth 3 (three) times daily., Until Discontinued, Historical Med    hydrochlorothiazide (HYDRODIURIL) 25 MG tablet Take 25 mg by mouth daily., Until Discontinued, Historical Med    HYDROcodone-acetaminophen (NORCO) 7.5-325 MG tablet Take 1 tablet by mouth 4 (four) times daily., Until Discontinued, Historical Med    montelukast (SINGULAIR) 10 MG tablet Take 10 mg by mouth every morning., Until Discontinued, Historical Med    oxyCODONE (OXYCONTIN) 20 mg 12 hr tablet Take 20 mg by mouth 3 (three) times daily., Until Discontinued, Historical Med    tiotropium (SPIRIVA) 18 MCG inhalation capsule Place 18 mcg into inhaler and inhale daily., Until Discontinued, Historical Med  DISCHARGE INSTRUCTIONS:    Stable condition. No home health needs. No oxygen. Heart healthy diet.  If you experience worsening of your admission symptoms, develop shortness of breath, life threatening emergency, suicidal or homicidal thoughts you must seek medical attention  immediately by calling 911 or calling your MD immediately  if symptoms less severe.  You Must read complete instructions/literature along with all the possible adverse reactions/side effects for all the Medicines you take and that have been prescribed to you. Take any new Medicines after you have completely understood and accept all the possible adverse reactions/side effects.   Please note  You were cared for by a hospitalist during your hospital stay. If you have any questions about your discharge medications or the care you received while you were in the hospital after you are discharged, you can call the unit and asked to speak with the hospitalist on call if the hospitalist that took care of you is not available. Once you are discharged, your primary care physician will handle any further medical issues. Please note that NO REFILLS for any discharge medications will be authorized once you are discharged, as it is imperative that you return to your primary care physician (or establish a relationship with a primary care physician if you do not have one) for your aftercare needs so that they can reassess your need for medications and monitor your lab values.    Today   CHIEF COMPLAINT:   Chief Complaint  Patient presents with  . Shortness of Breath    HISTORY OF PRESENT ILLNESS:  Marsella Suman is a 64 y.o. female with a known history of COPD and still continues to smoke is presenting to the ED with a chief complaint of shortness of breath. Patient is having productive cough with yellow phlegm since Tuesday. Denies any fever or dizziness. Denies any sick contacts. Came into the ED chest x-ray has revealed right upper lobe pneumonia. Patient is started on IV levofloxacin and hospitalist team is called to admit the patient. Patient's potassium is at 2.2 and magnesium is at 1.4. Patient has received 10 mEq of IV potassium chloride and 40 mg of by mouth potassium in the ED. Denies any chest pain  during my examination. No similar complaints in the past. EKG with no acute changes  VITAL SIGNS:  Blood pressure 154/70, pulse 89, temperature 97.5 F (36.4 C), temperature source Oral, resp. rate 17, height  (1.626 m), weight 52.3 kg (115 lb 4.8 oz), SpO2 92 %.  I/O:   Intake/Output Summary (Last 24 hours) at 10/20/15 1558 Last data filed at 10/19/15 2140  Gross per 24 hour  Intake      0 ml  Output    850 ml  Net   -850 ml    PHYSICAL EXAMINATION:  GENERAL:  64 y.o.-year-old patient lying in the bed with no acute distress.  LUNGS: Good air movement, no respiratory distress, fine wheezes, no rhonchi or rales.  CARDIOVASCULAR: S1, S2 normal. No murmurs, rubs, or gallops.  ABDOMEN: Soft, non-tender, non-distended. Bowel sounds present. No organomegaly or mass.  EXTREMITIES: No pedal edema, cyanosis, or clubbing.  NEUROLOGIC: Cranial nerves II through XII are intact. Muscle strength 5/5 in all extremities. Sensation intact. Gait not checked.  PSYCHIATRIC: The patient is alert and oriented x 3.  SKIN: No obvious rash, lesion, or ulcer.   DATA REVIEW:   CBC  Recent Labs Lab 10/20/15 0455  WBC 5.1  HGB 12.0  HCT 34.6*  PLT 578*  Chemistries   Recent Labs Lab 10/20/15 0455  NA 135  K 3.2*  CL 101  CO2 29  GLUCOSE 153*  BUN <5*  CREATININE <0.30*  CALCIUM 7.9*  MG 1.9    Cardiac Enzymes  Recent Labs Lab 10/18/15 1237  TROPONINI 0.04*    Microbiology Results  Results for orders placed or performed during the hospital encounter of 10/18/15  Culture, blood (routine x 2)     Status: None (Preliminary result)   Collection Time: 10/18/15 12:12 PM  Result Value Ref Range Status   Specimen Description BLOOD LEFT HAND  Final   Special Requests BAA,ANA,AER,5ML  Final   Culture NO GROWTH 2 DAYS  Final   Report Status PENDING  Incomplete  Culture, blood (routine x 2)     Status: None (Preliminary result)   Collection Time: 10/18/15  1:12 PM  Result Value  Ref Range Status   Specimen Description BLOOD LEFT ARM  Final   Special Requests BAA,ANA,AER,5ML  Final   Culture NO GROWTH 2 DAYS  Final   Report Status PENDING  Incomplete  Culture, sputum-assessment     Status: None   Collection Time: 10/19/15  5:10 AM  Result Value Ref Range Status   Specimen Description EXPECTORATED SPUTUM  Final   Special Requests NONE  Final   Sputum evaluation   Final    Sputum specimen not acceptable for testing.  Please recollect.   Jules Husbands CAHOON AT 1610 10/19/15.PMH   Report Status 10/19/2015 FINAL  Final    RADIOLOGY:  No results found.  EKG:   Orders placed or performed during the hospital encounter of 10/18/15  . ED EKG  . ED EKG  . EKG 12-Lead  . EKG 12-Lead      Management plans discussed with the patient, family and they are in agreement.  CODE STATUS:  Advance Directive Documentation        Most Recent Value   Type of Advance Directive  Healthcare Power of Attorney, Living will   Pre-existing out of facility DNR order (yellow form or pink MOST form)     "MOST" Form in Place?        TOTAL TIME TAKING CARE OF THIS PATIENT: 35 minutes.  Greater than 50% of time spent in care coordination and counseling.  Elby Showers M.D on 10/20/2015 at 3:58 PM  Between 7am to 6pm - Pager - [phone]  After 6pm go to [email or web address] - password [email or web address]  [facility]  Office  [phone]  CC: Primary care physician; No primary care provider on file.

## 2015-10-20 NOTE — Care Management Important Message (Signed)
Important Message  Patient Details  Name: Pam Johnson MRN: Townsend [id number] Date of Birth: 11/01/1950   Medicare Important Message Given:  Yes    Gwenette GreetBrenda S Niomie Englert, RN 10/20/2015, 9:50 AM

## 2015-10-20 NOTE — Plan of Care (Signed)
Problem: Activity: Goal: Ability to tolerate increased activity will improve Outcome: Progressing Pt is alert and oriented, c/o chronic pain. Scheduled pain medication given. Requested sleep medication. MD notified, 5 mg Ambien ordered PRN qHS.  High Fall Risk, encouraged to call for assistance.  Problem: Respiratory: Goal: Respiratory status will improve Outcome: Progressing Pt continues to be on room air, normal oxygen saturations. Expiratory wheezes still present. Continue to assess.

## 2015-10-20 NOTE — Progress Notes (Signed)
Pt discharged today, discharge instructions reviewed with patient, she verbalized understanding. Pt belongings given to pt, IV removed. Paperwork and prescriptions given to pt. Pt rolled out in wheelchair by staff.

## 2015-10-20 NOTE — Discharge Instructions (Signed)
Take it easy for the next week or two as you recover. Continue home inhalers, nebulizer treatment every 6 hours, antibiotics and steroid taper.   DIET:  Cardiac diet  DISCHARGE CONDITION:  Stable  ACTIVITY:  Activity as tolerated  OXYGEN:  Home Oxygen: No.   Oxygen Delivery: room air  DISCHARGE LOCATION:  home   If you experience worsening of your admission symptoms, develop shortness of breath, life threatening emergency, suicidal or homicidal thoughts you must seek medical attention immediately by calling 911 or calling your MD immediately  if symptoms less severe.  You Must read complete instructions/literature along with all the possible adverse reactions/side effects for all the Medicines you take and that have been prescribed to you. Take any new Medicines after you have completely understood and accpet all the possible adverse reactions/side effects.   Please note  You were cared for by a hospitalist during your hospital stay. If you have any questions about your discharge medications or the care you received while you were in the hospital after you are discharged, you can call the unit and asked to speak with the hospitalist on call if the hospitalist that took care of you is not available. Once you are discharged, your primary care physician will handle any further medical issues. Please note that NO REFILLS for any discharge medications will be authorized once you are discharged, as it is imperative that you return to your primary care physician (or establish a relationship with a primary care physician if you do not have one) for your aftercare needs so that they can reassess your need for medications and monitor your lab values.      You have evaluated for cough and trouble breathing and found to have a right upper lobe pneumonia. You're being treated with levofloxacin antibiotic.  Return to the emergency department for any worsening condition including fever,  confusion, weakness or numbness, dizziness, low blood pressure, trouble breathing, chest pain, or any other symptoms concerning to you.   Community-Acquired Pneumonia, Adult Pneumonia is an infection of the lungs. One type of pneumonia can happen while a person is in a hospital. A different type can happen when a person is not in a hospital (community-acquired pneumonia). It is easy for this kind to spread from person to person. It can spread to you if you breathe near an infected person who coughs or sneezes. Some symptoms include:  A dry cough.  A wet (productive) cough.  Fever.  Sweating.  Chest pain. HOME CARE  Take over-the-counter and prescription medicines only as told by your doctor.  Only take cough medicine if you are losing sleep.  If you were prescribed an antibiotic medicine, take it as told by your doctor. Do not stop taking the antibiotic even if you start to feel better.  Sleep with your head and neck raised (elevated). You can do this by putting a few pillows under your head, or you can sleep in a recliner.  Do not use tobacco products. These include cigarettes, chewing tobacco, and e-cigarettes. If you need help quitting, ask your doctor.  Drink enough water to keep your pee (urine) clear or pale yellow. A shot (vaccine) can help prevent pneumonia. Shots are often suggested for:  People older than 64 years of age.  People older than 64 years of age:  Who are having cancer treatment.  Who have long-term (chronic) lung disease.  Who have problems with their body's defense system (immune system). You may also prevent pneumonia  if you take these actions:  Get the flu (influenza) shot every year.  Go to the dentist as often as told.  Wash your hands often. If soap and water are not available, use hand sanitizer. GET HELP IF:  You have a fever.  You lose sleep because your cough medicine does not help. GET HELP RIGHT AWAY IF:  You are short of breath  and it gets worse.  You have more chest pain.  Your sickness gets worse. This is very serious if:  You are an older adult.  Your body's defense system is weak.  You cough up blood.   This information is not intended to replace advice given to you by your health care provider. Make sure you discuss any questions you have with your health care provider.   Document Released: 03/29/2008 Document Revised: 07/02/2015 Document Reviewed: 02/05/2015 Elsevier Interactive Patient Education [employer]

## 2015-10-23 LAB — CULTURE, BLOOD (ROUTINE X 2)
Culture: NO GROWTH
Culture: NO GROWTH

## 2015-10-24 ENCOUNTER — Emergency Department
Admission: EM | Admit: 2015-10-24 | Discharge: 2015-10-24 | Disposition: A | Discharge status: Home / Self Care | Payer: Medicare Other | Attending: Emergency Medicine | Admitting: Emergency Medicine

## 2015-10-24 ENCOUNTER — Emergency Department: Payer: Medicare Other

## 2015-10-24 ENCOUNTER — Encounter: Payer: Self-pay | Admitting: Emergency Medicine

## 2015-10-24 DIAGNOSIS — J441 Chronic obstructive pulmonary disease with (acute) exacerbation: Secondary | ICD-10-CM | POA: Diagnosis not present

## 2015-10-24 DIAGNOSIS — Z79891 Long term (current) use of opiate analgesic: Secondary | ICD-10-CM | POA: Insufficient documentation

## 2015-10-24 DIAGNOSIS — Z79899 Other long term (current) drug therapy: Secondary | ICD-10-CM | POA: Insufficient documentation

## 2015-10-24 DIAGNOSIS — R000 Tachycardia, unspecified: Secondary | ICD-10-CM | POA: Insufficient documentation

## 2015-10-24 DIAGNOSIS — F17210 Nicotine dependence, cigarettes, uncomplicated: Secondary | ICD-10-CM | POA: Diagnosis not present

## 2015-10-24 DIAGNOSIS — Z7951 Long term (current) use of inhaled steroids: Secondary | ICD-10-CM | POA: Diagnosis not present

## 2015-10-24 DIAGNOSIS — Z880 Allergy status to penicillin: Secondary | ICD-10-CM | POA: Diagnosis not present

## 2015-10-24 DIAGNOSIS — R0602 Shortness of breath: Secondary | ICD-10-CM | POA: Diagnosis present

## 2015-10-24 LAB — CBC WITH DIFFERENTIAL/PLATELET
BASOS ABS: 0.1 10*3/uL (ref 0–0.1)
Basophils Relative: 1 %
EOS PCT: 1 %
Eosinophils Absolute: 0.1 10*3/uL (ref 0–0.7)
HCT: 42.6 % (ref 35.0–47.0)
Hemoglobin: 14.6 g/dL (ref 12.0–16.0)
LYMPHS PCT: 23 %
Lymphs Abs: 2.7 10*3/uL (ref 1.0–3.6)
MCH: 31.2 pg (ref 26.0–34.0)
MCHC: 34.3 g/dL (ref 32.0–36.0)
MCV: 91 fL (ref 80.0–100.0)
MONO ABS: 1.5 10*3/uL — AB (ref 0.2–0.9)
MONOS PCT: 12 %
Neutro Abs: 7.5 10*3/uL — ABNORMAL HIGH (ref 1.4–6.5)
Neutrophils Relative %: 63 %
PLATELETS: 817 10*3/uL — AB (ref 150–440)
RBC: 4.68 MIL/uL (ref 3.80–5.20)
RDW: 14.4 % (ref 11.5–14.5)
WBC: 11.7 10*3/uL — ABNORMAL HIGH (ref 3.6–11.0)

## 2015-10-24 LAB — BASIC METABOLIC PANEL
Anion gap: 13 (ref 5–15)
BUN: 5 mg/dL — AB (ref 6–20)
CO2: 24 mmol/L (ref 22–32)
Calcium: 8.4 mg/dL — ABNORMAL LOW (ref 8.9–10.3)
Chloride: 97 mmol/L — ABNORMAL LOW (ref 101–111)
Creatinine, Ser: 0.3 mg/dL — ABNORMAL LOW (ref 0.44–1.00)
GFR calc Af Amer: >60 mL/min (ref >60)
GLUCOSE: 120 mg/dL — AB (ref 65–99)
POTASSIUM: 2.5 mmol/L — AB (ref 3.5–5.1)
Sodium: 134 mmol/L — ABNORMAL LOW (ref 135–145)

## 2015-10-24 MED ORDER — DOXYCYCLINE HYCLATE 100 MG PO CAPS: 100.0000 mg | ORAL_CAPSULE | Freq: Two times a day (BID) | ORAL | Status: DC

## 2015-10-24 MED ORDER — IPRATROPIUM-ALBUTEROL 0.5-2.5 (3) MG/3ML IN SOLN
3.0000 mL | Freq: Once | RESPIRATORY_TRACT | Status: AC
  Administered 2015-10-24: 3 mL via RESPIRATORY_TRACT
  Filled 2015-10-24: qty 3

## 2015-10-24 MED ORDER — SODIUM CHLORIDE 0.9 % IV BOLUS (SEPSIS)
1000.0000 mL | Freq: Once | INTRAVENOUS | Status: AC
  Administered 2015-10-24: 1000 mL via INTRAVENOUS

## 2015-10-24 MED ORDER — ALBUTEROL SULFATE (2.5 MG/3ML) 0.083% IN NEBU
5.0000 mg | INHALATION_SOLUTION | Freq: Once | RESPIRATORY_TRACT | Status: AC
  Administered 2015-10-24: 5 mg via RESPIRATORY_TRACT
  Filled 2015-10-24: qty 6

## 2015-10-24 MED ORDER — PREDNISONE 10 MG PO TABS: ORAL_TABLET | ORAL | Status: DC

## 2015-10-24 MED ORDER — ONDANSETRON 4 MG PO TBDP: 4.0000 mg | ORAL_TABLET | Freq: Three times a day (TID) | ORAL | Status: DC | PRN

## 2015-10-24 MED ORDER — METHYLPREDNISOLONE SODIUM SUCC 125 MG IJ SOLR
125.0000 mg | Freq: Once | INTRAMUSCULAR | Status: AC
  Administered 2015-10-24: 125 mg via INTRAVENOUS
  Filled 2015-10-24: qty 2

## 2015-10-24 MED ORDER — ONDANSETRON HCL 4 MG/2ML IJ SOLN
4.0000 mg | Freq: Once | INTRAMUSCULAR | Status: AC
  Administered 2015-10-24: 4 mg via INTRAVENOUS
  Filled 2015-10-24: qty 2

## 2015-10-24 MED ORDER — POTASSIUM CHLORIDE CRYS ER 20 MEQ PO TBCR
40.0000 meq | EXTENDED_RELEASE_TABLET | Freq: Once | ORAL | Status: AC
  Administered 2015-10-24: 40 meq via ORAL
  Filled 2015-10-24: qty 2

## 2015-10-24 NOTE — Discharge Instructions (Signed)

## 2015-10-24 NOTE — ED Provider Notes (Signed)
[facility] Emergency Department Provider Note  ____________________________________________  Time seen: 8:40 AM  I have reviewed the triage vital signs and the nursing notes.   HISTORY  Chief Complaint Shortness of Breath    HPI Pam Johnson is a 64 y.o. female with COPD who complains of productive cough and shortness of breath as well as dyspnea on exertion. She was recently admitted to the hospital 6 days ago for right upper lobe pneumonia and COPD exacerbation. She stayed in the hospital for 2 days receiving Levaquin and when she was discharged to continue Levaquin to complete a 5 day course as well as a rapid taper of prednisone. She took the prednisone but has not taken any todayand is currently on 30 mg a day. She has a history of smoking and has resumed smoking immediately upon return home. Denies fever, no chest pain no dizziness or syncope. No abdominal pain vomiting or diarrhea.     Past Medical History  Diagnosis Date  . COPD (chronic obstructive pulmonary disease) [facility](HCC)      Patient Active Problem List   Diagnosis Date Noted  . Pneumonia 10/18/2015     Past Surgical History  Procedure Laterality Date  . Cholecystectomy    . Hernia repair    . Abdominal hysterectomy       Current Outpatient Rx  Name  Route  Sig  Dispense  Refill  . albuterol (PROVENTIL HFA;VENTOLIN HFA) 108 (90 BASE) MCG/ACT inhaler   Inhalation   Inhale 2 puffs into the lungs every 6 (six) hours as needed for wheezing or shortness of breath.         Marland Kitchen. amLODipine (NORVASC) 2.5 MG tablet   Oral   Take 2.5 mg by mouth daily.         Marland Kitchen. esomeprazole (NEXIUM) 40 MG capsule   Oral   Take 40 mg by mouth 2 (two) times daily.         . Fluticasone-Salmeterol (ADVAIR) 500-50 MCG/DOSE AEPB   Inhalation   Inhale 1 puff into the lungs 2 (two) times daily.         Marland Kitchen. gabapentin (NEURONTIN) 600 MG tablet   Oral   Take 600 mg by mouth 3 (three) times  daily.         . hydrochlorothiazide (HYDRODIURIL) 25 MG tablet   Oral   Take 25 mg by mouth daily.         Marland Kitchen. HYDROcodone-acetaminophen (NORCO) 7.5-325 MG tablet   Oral   Take 1 tablet by mouth 4 (four) times daily.         Marland Kitchen. ipratropium-albuterol (DUONEB) 0.5-2.5 (3) MG/3ML SOLN   Nebulization   Take 3 mLs by nebulization every 6 (six) hours.   360 mL   0   . montelukast (SINGULAIR) 10 MG tablet   Oral   Take 10 mg by mouth every morning.         Marland Kitchen. oxyCODONE (OXYCONTIN) 20 mg 12 hr tablet   Oral   Take 20 mg by mouth 3 (three) times daily.         Marland Kitchen. tiotropium (SPIRIVA) 18 MCG inhalation capsule   Inhalation   Place 18 mcg into inhaler and inhale daily.         Marland Kitchen. doxycycline (VIBRAMYCIN) 100 MG capsule   Oral   Take 1 capsule (100 mg total) by mouth 2 (two) times daily.   28 capsule   0   . levofloxacin (LEVAQUIN) 500 MG tablet  Oral   Take 1 tablet (500 mg total) by mouth daily. Patient not taking: Reported on 10/24/2015   3 tablet   0   . nicotine (NICODERM CQ - DOSED IN MG/24 HOURS) 21 mg/24hr patch   Transdermal   Place 1 patch (21 mg total) onto the skin daily. Patient not taking: Reported on 10/24/2015   28 patch   0   . predniSONE (DELTASONE) 10 MG tablet      Take five tablets a day (50 mg) for 3 days,  Then four tablets a day (40 mg) for 3 days  Then three tablets a day (30 mg) for 3 days  Then two tablets a day (20 mg) for 5 days  Then one tablet a day (10 mg) for 5 days   51 tablet   0      Allergies Ciprofloxacin and Penicillins   History reviewed. No pertinent family history.  Social History Social History  Substance Use Topics  . Smoking status: Current Every Day Smoker -- 1.00 packs/day    Types: Cigarettes  . Smokeless tobacco: None  . Alcohol Use: No    Review of Systems  Constitutional:   No fever or chills. No weight changes Eyes:   No blurry vision or double vision.  ENT:   No sore  throat. Cardiovascular:   No chest pain. Respiratory:   Positive shortness of breath and productive cough. Gastrointestinal:   Negative for abdominal pain, vomiting and diarrhea.  No BRBPR or melena. Genitourinary:   Negative for dysuria, urinary retention, bloody urine, or difficulty urinating. Musculoskeletal:   Negative for back pain. No joint swelling or pain. Skin:   Negative for rash. Neurological:   Negative for headaches, focal weakness or numbness. Psychiatric:  No anxiety or depression.   Endocrine:  No hot/cold intolerance, changes in energy, or sleep difficulty.  10-point ROS otherwise negative.  ____________________________________________   PHYSICAL EXAM:  VITAL SIGNS: ED Triage Vitals  Enc Vitals Group     BP --      Pulse --      Resp --      Temp --      Temp src --      SpO2 --      Weight --      Height --      Head Cir --      Peak Flow --      Pain Score --      Pain Loc --      Pain Edu? --      Excl. in GC? --    Patient reports baseline oxygen saturation is 90%. Does not use home oxygen. Vital signs reviewed, nursing assessments reviewed.   Constitutional:   Alert and oriented. Well appearing and in no distress. Somewhat emaciated Eyes:   No scleral icterus. No conjunctival pallor. PERRL. EOMI ENT   Head:   Normocephalic and atraumatic.   Nose:   No congestion/rhinnorhea. No septal hematoma   Mouth/Throat:   Dry mucous membranes, no pharyngeal erythema. No peritonsillar mass. No uvula shift.   Neck:   No stridor. No SubQ emphysema. No meningismus. Hematological/Lymphatic/Immunilogical:   No cervical lymphadenopathy. Cardiovascular:   Tachycardia heart rate 105. Normal and symmetric distal pulses are present in all extremities. No murmurs, rubs, or gallops. Respiratory:  Diffuse expiratory wheezing, left greater than right. Wheezing is more pronounced with forceful rapid expiration. Normal expiratory phase. No retractions or  increased work of breathing or accessory muscle use.Marland Kitchen  Barrel chested Gastrointestinal:   Soft and nontender. No distention. There is no CVA tenderness.  No rebound, rigidity, or guarding. Genitourinary:   deferred Musculoskeletal:   Nontender with normal range of motion in all extremities. No joint effusions.  No lower extremity tenderness.  No edema. Neurologic:   Normal speech and language.  CN 2-10 normal. Motor grossly intact. No pronator drift.  Normal gait. No gross focal neurologic deficits are appreciated.  Skin:    Skin is warm, dry and intact. No rash noted.  No petechiae, purpura, or bullae. Psychiatric:   Mood and affect are normal. Speech and behavior are normal. Patient exhibits appropriate insight and judgment.  ____________________________________________    LABS (pertinent positives/negatives) (all labs ordered are listed, but only abnormal results are displayed) Labs Reviewed  BASIC METABOLIC PANEL - Abnormal; Notable for the following:    Sodium 134 (*)    Potassium 2.5 (*)    Chloride 97 (*)    Glucose, Bld 120 (*)    BUN 5 (*)    Creatinine, Ser 0.30 (*)    Calcium 8.4 (*)    All other components within normal limits  CBC WITH DIFFERENTIAL/PLATELET - Abnormal; Notable for the following:    WBC 11.7 (*)    Platelets 817 (*)    Neutro Abs 7.5 (*)    Monocytes Absolute 1.5 (*)    All other components within normal limits   ____________________________________________   EKG    ____________________________________________    RADIOLOGY  Chest x-ray shows near resolution of previously identified opacity him and no other acute abnormalities  ____________________________________________   PROCEDURES   ____________________________________________   INITIAL IMPRESSION / ASSESSMENT AND PLAN / ED COURSE  Pertinent labs & imaging results that were available during my care of the patient were reviewed by me and considered in my medical decision making  (see chart for details).  Patient presents with shortness of breath and productive cough with each is likely a continuation of the COPD exacerbation that she had with her recent pneumonia. Does not have a fever and is not septic at present time. Additionally her symptoms are likely worsened because she resumed smoking immediately upon returning home despite trying to recover from an acute illness. With her recent illness and inability to modify her lifestyle, she'll likely need a prolonged course of steroids and antibiotics to control her symptoms. We'll recheck labs and chest x-ray, give steroids and breathing treatments here, IV fluids for her slight tachycardia, and if there are no serious abnormalities that patient may be stable for discharge home and follow up with primary care. At that point we would put her on a prolonged steroid taper and doxycycline.  ----------------------------------------- 10:07 AM on 10/24/2015 -----------------------------------------  Vitals stable, oxygenation good, patient improved after steroids and nebulizer treatments. Workup is unremarkable except for hypokalemia which is likely due to her increased nebulizer use. I think that she is having ongoing COPD exacerbation because of her continued smoking and smoke exposure. Put her on a long prednisone taper and doxycycline and have her follow up with primary care and pulmonology. Low suspicion for PE heart failure ACS or other acute cardiopulmonary pathology.   ____________________________________________   FINAL CLINICAL IMPRESSION(S) / ED DIAGNOSES  Final diagnoses:  COPD with acute exacerbation (HCC)      Sharman Cheek, MD 10/24/15 1008

## 2015-10-24 NOTE — ED Notes (Signed)
Patient arrives today via POV from home with c/o ShOB. Recent admission for Pnu. Patient c/o productive cough and ShOB with ambulation

## 2015-10-24 NOTE — ED Notes (Signed)
MD notified of critical K+ 

## 2016-01-27 ENCOUNTER — Emergency Department: Payer: Medicare Other

## 2016-01-27 ENCOUNTER — Emergency Department
Admission: EM | Admit: 2016-01-27 | Discharge: 2016-01-27 | Disposition: A | Discharge status: Home / Self Care | Payer: Medicare Other | Attending: Emergency Medicine | Admitting: Emergency Medicine

## 2016-01-27 ENCOUNTER — Encounter: Payer: Self-pay | Admitting: Emergency Medicine

## 2016-01-27 DIAGNOSIS — I10 Essential (primary) hypertension: Secondary | ICD-10-CM | POA: Insufficient documentation

## 2016-01-27 DIAGNOSIS — F17210 Nicotine dependence, cigarettes, uncomplicated: Secondary | ICD-10-CM | POA: Insufficient documentation

## 2016-01-27 DIAGNOSIS — M5441 Lumbago with sciatica, right side: Secondary | ICD-10-CM | POA: Insufficient documentation

## 2016-01-27 DIAGNOSIS — M545 Low back pain: Secondary | ICD-10-CM | POA: Diagnosis present

## 2016-01-27 HISTORY — DX: Essential (primary) hypertension: I10

## 2016-01-27 HISTORY — DX: Spinal stenosis, site unspecified: M48.00

## 2016-01-27 MED ORDER — KETOROLAC TROMETHAMINE 30 MG/ML IJ SOLN
30.0000 mg | Freq: Once | INTRAMUSCULAR | Status: AC
  Administered 2016-01-27: 30 mg via INTRAMUSCULAR
  Filled 2016-01-27: qty 1

## 2016-01-27 NOTE — Discharge Instructions (Signed)

## 2016-01-27 NOTE — ED Notes (Signed)
Pt to ed with c/o back pain since falling several days ago and another fall 2 days ago.  Pt states severe pain in middle of lower back.  Pt states she falls often due to "losing my balance" pt also reports dizziness with fall.  Denies head injury.  Denies loss of consciousness.

## 2016-01-27 NOTE — ED Provider Notes (Signed)
[facility] Emergency Department Provider Note  ____________________________________________    I have reviewed the triage vital signs and the nursing notes.   HISTORY  Chief Complaint Back Pain    HPI Pam Johnson is a 65 y.o. female who presents with back pain after a fall. Patient reports she fell 2 days ago, she lost her balance while cleaning and fell backwards onto her bottom. Since then she has had right low back pain with some radiation into her right leg. No weakness or neuro deficits. No incontinence. She is ambulating well but with some pain. She has been taking her OxyContin 20 mg and reports it has not been "touching "the pain. Patient is notably hypoxic on the monitor but reports she does not feel short of breath. Her PCP apparently recently prescribed her prednisone which she thinks will help with her breathing.     Past Medical History  Diagnosis Date  . COPD (chronic obstructive pulmonary disease) (HCC)   . Hypertension   . Spinal stenosis     Patient Active Problem List   Diagnosis Date Noted  . Pneumonia 10/18/2015    Past Surgical History  Procedure Laterality Date  . Cholecystectomy    . Hernia repair    . Abdominal hysterectomy      Current Outpatient Rx  Name  Route  Sig  Dispense  Refill  . albuterol (PROVENTIL HFA;VENTOLIN HFA) 108 (90 BASE) MCG/ACT inhaler   Inhalation   Inhale 2 puffs into the lungs every 6 (six) hours as needed for wheezing or shortness of breath.         Marland Kitchen amLODipine (NORVASC) 2.5 MG tablet   Oral   Take 2.5 mg by mouth daily.         Marland Kitchen doxycycline (VIBRAMYCIN) 100 MG capsule   Oral   Take 1 capsule (100 mg total) by mouth 2 (two) times daily.   28 capsule   0   . esomeprazole (NEXIUM) 40 MG capsule   Oral   Take 40 mg by mouth 2 (two) times daily.         . Fluticasone-Salmeterol (ADVAIR) 500-50 MCG/DOSE AEPB   Inhalation   Inhale 1 puff into the lungs 2 (two) times daily.          Marland Kitchen gabapentin (NEURONTIN) 600 MG tablet   Oral   Take 600 mg by mouth 3 (three) times daily.         . hydrochlorothiazide (HYDRODIURIL) 25 MG tablet   Oral   Take 25 mg by mouth daily.         Marland Kitchen HYDROcodone-acetaminophen (NORCO) 7.5-325 MG tablet   Oral   Take 1 tablet by mouth 4 (four) times daily.         Marland Kitchen ipratropium-albuterol (DUONEB) 0.5-2.5 (3) MG/3ML SOLN   Nebulization   Take 3 mLs by nebulization every 6 (six) hours.   360 mL   0   . levofloxacin (LEVAQUIN) 500 MG tablet   Oral   Take 1 tablet (500 mg total) by mouth daily. Patient not taking: Reported on 10/24/2015   3 tablet   0   . montelukast (SINGULAIR) 10 MG tablet   Oral   Take 10 mg by mouth every morning.         . nicotine (NICODERM CQ - DOSED IN MG/24 HOURS) 21 mg/24hr patch   Transdermal   Place 1 patch (21 mg total) onto the skin daily. Patient not taking: Reported on 10/24/2015  28 patch   0   . ondansetron (ZOFRAN ODT) 4 MG disintegrating tablet   Oral   Take 1 tablet (4 mg total) by mouth every 8 (eight) hours as needed for nausea or vomiting.   20 tablet   0   . oxyCODONE (OXYCONTIN) 20 mg 12 hr tablet   Oral   Take 20 mg by mouth 3 (three) times daily.         . predniSONE (DELTASONE) 10 MG tablet      Take five tablets a day (50 mg) for 3 days,  Then four tablets a day (40 mg) for 3 days  Then three tablets a day (30 mg) for 3 days  Then two tablets a day (20 mg) for 5 days  Then one tablet a day (10 mg) for 5 days   51 tablet   0   . tiotropium (SPIRIVA) 18 MCG inhalation capsule   Inhalation   Place 18 mcg into inhaler and inhale daily.           Allergies Ciprofloxacin and Penicillins  History reviewed. No pertinent family history.  Social History Social History  Substance Use Topics  . Smoking status: Current Every Day Smoker -- 1.00 packs/day    Types: Cigarettes  . Smokeless tobacco: None  . Alcohol Use: No    Review of  Systems  Constitutional: Negative for fever. Eyes: Negative for redness ENT: Negative for sore throat Cardiovascular: Negative for chest pain Respiratory: Negative for shortness of breath.No cough Gastrointestinal: Negative for abdominal pain Genitourinary: Negative for dysuria. No incontinence Musculoskeletal: Back pain as above Skin: Negative for bruising Neurological: Negative for focal weakness, no sensory deficits Psychiatric: no anxiety    ____________________________________________   PHYSICAL EXAM:  VITAL SIGNS: ED Triage Vitals  Enc Vitals Group     BP 01/27/16 0730 114/62 mmHg     Pulse Rate 01/27/16 0730 98     Resp 01/27/16 0730 20     Temp 01/27/16 0730 99.4 F (37.4 C)     Temp Source 01/27/16 0730 Oral     SpO2 01/27/16 0730 89 %     Weight 01/27/16 0730 113 lb (51.256 kg)     Height 01/27/16 0730  (1.626 m)     Head Cir --      Peak Flow --      Pain Score 01/27/16 0731 10     Pain Loc --      Pain Edu? --      Excl. in GC? --      Constitutional: Alert and oriented. Well appearing and in no distress.  Eyes: Conjunctivae are normal. No erythema or injection ENT   Head: Normocephalic and atraumatic.   Mouth/Throat: Mucous membranes are moist. Cardiovascular: Normal rate, regular rhythm. Normal and symmetric distal pulses are present in the upper extremities. Respiratory: Normal respiratory effort without tachypnea nor retractions. Mild wheezing throughout, good airflow.  Gastrointestinal: Soft and non-tender in all quadrants. No distention. There is no CVA tenderness. Genitourinary: deferred Musculoskeletal: Nontender with normal range of motion in all extremities. No lower extremity tenderness. No pain with axial load of the hips. 2+ distal pulses. Neurologic:  Normal speech and language. No gross focal neurologic deficits are appreciated. Normal sensation in the groin Skin:  Skin is warm, dry and intact. No rash noted. Psychiatric:  Mood and affect are normal. Patient exhibits appropriate insight and judgment.  ____________________________________________    LABS (pertinent positives/negatives)  Labs Reviewed - No data  to display  ____________________________________________   EKG  None  ____________________________________________    RADIOLOGY  X-rays show no acute fracture  ____________________________________________   PROCEDURES  Procedure(s) performed: none  Critical Care performed:none  ____________________________________________   INITIAL IMPRESSION / ASSESSMENT AND PLAN / ED COURSE  Pertinent labs & imaging results that were available during my care of the patient were reviewed by me and considered in my medical decision making (see chart for details).  Patient presents with back pain after fall. We will obtain lumbosacral films as well as a pelvis x-ray. Because of her hypoxia I ordered a chest x-ray as well and we will give a DuoNeb. The patient has no interest in being admitted for her hypoxia as she feels she is at baseline. She does agree to nebulizers.  X-rays show no acute fracture. Patient is anxious to go home and has no interest in staying in the hospital. She denies she can return at any timeif her breathing worsens ____________________________________________   FINAL CLINICAL IMPRESSION(S) / ED DIAGNOSES  Final diagnoses:  Right-sided low back pain with right-sided sciatica          Jene Everyobert Brandalyn Harting, MD 01/27/16 1550

## 2016-03-09 ENCOUNTER — Emergency Department (HOSPITAL_COMMUNITY)
Admission: EM | Admit: 2016-03-09 | Discharge: 2016-03-09 | Disposition: A | Discharge status: Home / Self Care | Payer: Medicare Other | Attending: Emergency Medicine | Admitting: Emergency Medicine

## 2016-03-09 ENCOUNTER — Encounter (HOSPITAL_COMMUNITY): Payer: Self-pay

## 2016-03-09 DIAGNOSIS — J449 Chronic obstructive pulmonary disease, unspecified: Secondary | ICD-10-CM | POA: Insufficient documentation

## 2016-03-09 DIAGNOSIS — S3992XA Unspecified injury of lower back, initial encounter: Secondary | ICD-10-CM | POA: Diagnosis not present

## 2016-03-09 DIAGNOSIS — Y9289 Other specified places as the place of occurrence of the external cause: Secondary | ICD-10-CM | POA: Insufficient documentation

## 2016-03-09 DIAGNOSIS — Y9389 Activity, other specified: Secondary | ICD-10-CM | POA: Insufficient documentation

## 2016-03-09 DIAGNOSIS — W010XXA Fall on same level from slipping, tripping and stumbling without subsequent striking against object, initial encounter: Secondary | ICD-10-CM | POA: Insufficient documentation

## 2016-03-09 DIAGNOSIS — Y998 Other external cause status: Secondary | ICD-10-CM | POA: Insufficient documentation

## 2016-03-09 DIAGNOSIS — I10 Essential (primary) hypertension: Secondary | ICD-10-CM | POA: Diagnosis not present

## 2016-03-09 DIAGNOSIS — S51012A Laceration without foreign body of left elbow, initial encounter: Secondary | ICD-10-CM | POA: Insufficient documentation

## 2016-03-09 DIAGNOSIS — F17210 Nicotine dependence, cigarettes, uncomplicated: Secondary | ICD-10-CM | POA: Diagnosis not present

## 2016-03-09 HISTORY — DX: Gastro-esophageal reflux disease without esophagitis: K21.9

## 2016-03-09 HISTORY — DX: Unspecified fracture of sacrum, initial encounter for closed fracture: S32.10XA

## 2016-03-09 NOTE — ED Notes (Signed)
Pt reports using walker and tripped, fell forward and to left side, cutting left elbow on walker.  1" lac, bleeding controlled on left elbow.  C/o worsening sacral back pain.  Pt fell 2 months ago injuring sacral area of back, pt received results yesterday and has fracture, is being referred to orthopedic doctor.  No head injury.

## 2016-03-09 NOTE — ED Notes (Signed)
Patient called for room 3X with no response 

## 2016-05-17 ENCOUNTER — Encounter (HOSPITAL_COMMUNITY): Payer: Self-pay

## 2017-11-25 HISTORY — PX: TOTAL HIP ARTHROPLASTY: SHX124

## 2018-03-22 ENCOUNTER — Emergency Department (HOSPITAL_COMMUNITY): Payer: Medicare Other

## 2018-03-22 ENCOUNTER — Other Ambulatory Visit: Payer: Self-pay

## 2018-03-22 ENCOUNTER — Encounter (HOSPITAL_COMMUNITY)
Admission: EM | Disposition: A | Discharge status: Rehabilitation Facility | Payer: Self-pay | Source: Home / Self Care | Attending: Neurology

## 2018-03-22 ENCOUNTER — Emergency Department (HOSPITAL_COMMUNITY): Payer: Medicare Other | Admitting: Anesthesiology

## 2018-03-22 ENCOUNTER — Inpatient Hospital Stay (HOSPITAL_COMMUNITY)
Admission: EM | Admit: 2018-03-22 | Discharge: 2018-03-24 | DRG: 065 | Disposition: A | Discharge status: Rehabilitation Facility | Payer: Medicare Other | Attending: Neurology | Admitting: Neurology

## 2018-03-22 ENCOUNTER — Encounter (HOSPITAL_COMMUNITY): Payer: Self-pay | Admitting: Emergency Medicine

## 2018-03-22 DIAGNOSIS — Z7289 Other problems related to lifestyle: Secondary | ICD-10-CM

## 2018-03-22 DIAGNOSIS — K219 Gastro-esophageal reflux disease without esophagitis: Secondary | ICD-10-CM | POA: Diagnosis present

## 2018-03-22 DIAGNOSIS — G8929 Other chronic pain: Secondary | ICD-10-CM | POA: Diagnosis present

## 2018-03-22 DIAGNOSIS — R402362 Coma scale, best motor response, obeys commands, at arrival to emergency department: Secondary | ICD-10-CM | POA: Diagnosis present

## 2018-03-22 DIAGNOSIS — F1290 Cannabis use, unspecified, uncomplicated: Secondary | ICD-10-CM | POA: Diagnosis present

## 2018-03-22 DIAGNOSIS — R471 Dysarthria and anarthria: Secondary | ICD-10-CM | POA: Diagnosis present

## 2018-03-22 DIAGNOSIS — Z881 Allergy status to other antibiotic agents status: Secondary | ICD-10-CM

## 2018-03-22 DIAGNOSIS — J449 Chronic obstructive pulmonary disease, unspecified: Secondary | ICD-10-CM | POA: Diagnosis present

## 2018-03-22 DIAGNOSIS — R402142 Coma scale, eyes open, spontaneous, at arrival to emergency department: Secondary | ICD-10-CM | POA: Diagnosis present

## 2018-03-22 DIAGNOSIS — Z538 Procedure and treatment not carried out for other reasons: Secondary | ICD-10-CM | POA: Diagnosis present

## 2018-03-22 DIAGNOSIS — I10 Essential (primary) hypertension: Secondary | ICD-10-CM | POA: Diagnosis present

## 2018-03-22 DIAGNOSIS — E876 Hypokalemia: Secondary | ICD-10-CM | POA: Diagnosis not present

## 2018-03-22 DIAGNOSIS — Z8673 Personal history of transient ischemic attack (TIA), and cerebral infarction without residual deficits: Secondary | ICD-10-CM

## 2018-03-22 DIAGNOSIS — R414 Neurologic neglect syndrome: Secondary | ICD-10-CM | POA: Diagnosis present

## 2018-03-22 DIAGNOSIS — I959 Hypotension, unspecified: Secondary | ICD-10-CM | POA: Diagnosis not present

## 2018-03-22 DIAGNOSIS — I69391 Dysphagia following cerebral infarction: Secondary | ICD-10-CM | POA: Diagnosis not present

## 2018-03-22 DIAGNOSIS — I63231 Cerebral infarction due to unspecified occlusion or stenosis of right carotid arteries: Secondary | ICD-10-CM | POA: Diagnosis not present

## 2018-03-22 DIAGNOSIS — G894 Chronic pain syndrome: Secondary | ICD-10-CM | POA: Diagnosis not present

## 2018-03-22 DIAGNOSIS — I708 Atherosclerosis of other arteries: Secondary | ICD-10-CM | POA: Diagnosis present

## 2018-03-22 DIAGNOSIS — Z79891 Long term (current) use of opiate analgesic: Secondary | ICD-10-CM

## 2018-03-22 DIAGNOSIS — Z8249 Family history of ischemic heart disease and other diseases of the circulatory system: Secondary | ICD-10-CM

## 2018-03-22 DIAGNOSIS — F40240 Claustrophobia: Secondary | ICD-10-CM | POA: Diagnosis present

## 2018-03-22 DIAGNOSIS — F17210 Nicotine dependence, cigarettes, uncomplicated: Secondary | ICD-10-CM | POA: Diagnosis present

## 2018-03-22 DIAGNOSIS — I160 Hypertensive urgency: Secondary | ICD-10-CM | POA: Diagnosis not present

## 2018-03-22 DIAGNOSIS — I639 Cerebral infarction, unspecified: Secondary | ICD-10-CM

## 2018-03-22 DIAGNOSIS — Z888 Allergy status to other drugs, medicaments and biological substances status: Secondary | ICD-10-CM

## 2018-03-22 DIAGNOSIS — T17908A Unspecified foreign body in respiratory tract, part unspecified causing other injury, initial encounter: Secondary | ICD-10-CM

## 2018-03-22 DIAGNOSIS — G459 Transient cerebral ischemic attack, unspecified: Secondary | ICD-10-CM | POA: Diagnosis present

## 2018-03-22 DIAGNOSIS — Z79899 Other long term (current) drug therapy: Secondary | ICD-10-CM

## 2018-03-22 DIAGNOSIS — R52 Pain, unspecified: Secondary | ICD-10-CM

## 2018-03-22 DIAGNOSIS — I6522 Occlusion and stenosis of left carotid artery: Secondary | ICD-10-CM | POA: Diagnosis present

## 2018-03-22 DIAGNOSIS — R29713 NIHSS score 13: Secondary | ICD-10-CM | POA: Diagnosis present

## 2018-03-22 DIAGNOSIS — Z880 Allergy status to penicillin: Secondary | ICD-10-CM

## 2018-03-22 DIAGNOSIS — D62 Acute posthemorrhagic anemia: Secondary | ICD-10-CM | POA: Diagnosis not present

## 2018-03-22 DIAGNOSIS — H53462 Homonymous bilateral field defects, left side: Secondary | ICD-10-CM | POA: Diagnosis present

## 2018-03-22 DIAGNOSIS — R29810 Facial weakness: Secondary | ICD-10-CM | POA: Diagnosis present

## 2018-03-22 DIAGNOSIS — I69354 Hemiplegia and hemiparesis following cerebral infarction affecting left non-dominant side: Secondary | ICD-10-CM | POA: Diagnosis not present

## 2018-03-22 DIAGNOSIS — I69398 Other sequelae of cerebral infarction: Secondary | ICD-10-CM | POA: Diagnosis not present

## 2018-03-22 DIAGNOSIS — R51 Headache: Secondary | ICD-10-CM | POA: Diagnosis not present

## 2018-03-22 DIAGNOSIS — I6529 Occlusion and stenosis of unspecified carotid artery: Secondary | ICD-10-CM | POA: Diagnosis present

## 2018-03-22 DIAGNOSIS — R402252 Coma scale, best verbal response, oriented, at arrival to emergency department: Secondary | ICD-10-CM | POA: Diagnosis present

## 2018-03-22 DIAGNOSIS — Z96641 Presence of right artificial hip joint: Secondary | ICD-10-CM | POA: Diagnosis present

## 2018-03-22 DIAGNOSIS — G8104 Flaccid hemiplegia affecting left nondominant side: Secondary | ICD-10-CM | POA: Diagnosis present

## 2018-03-22 DIAGNOSIS — M4800 Spinal stenosis, site unspecified: Secondary | ICD-10-CM | POA: Diagnosis present

## 2018-03-22 DIAGNOSIS — I6389 Other cerebral infarction: Secondary | ICD-10-CM | POA: Diagnosis not present

## 2018-03-22 DIAGNOSIS — I63511 Cerebral infarction due to unspecified occlusion or stenosis of right middle cerebral artery: Secondary | ICD-10-CM | POA: Diagnosis not present

## 2018-03-22 DIAGNOSIS — I6521 Occlusion and stenosis of right carotid artery: Secondary | ICD-10-CM | POA: Diagnosis not present

## 2018-03-22 DIAGNOSIS — J410 Simple chronic bronchitis: Secondary | ICD-10-CM | POA: Diagnosis not present

## 2018-03-22 DIAGNOSIS — Z7951 Long term (current) use of inhaled steroids: Secondary | ICD-10-CM

## 2018-03-22 HISTORY — PX: IR PERCUTANEOUS ART THROMBECTOMY/INFUSION INTRACRANIAL INC DIAG ANGIO: IMG6087

## 2018-03-22 HISTORY — PX: IR ANGIO VERTEBRAL SEL SUBCLAVIAN INNOMINATE UNI R MOD SED: IMG5365

## 2018-03-22 HISTORY — PX: IR ANGIO INTRA EXTRACRAN SEL COM CAROTID INNOMINATE UNI L MOD SED: IMG5358

## 2018-03-22 HISTORY — PX: IR CT HEAD LTD: IMG2386

## 2018-03-22 HISTORY — PX: RADIOLOGY WITH ANESTHESIA: SHX6223

## 2018-03-22 LAB — CBC
HCT: 39.1 % (ref 36.0–46.0)
Hemoglobin: 12.8 g/dL (ref 12.0–15.0)
MCH: 29.8 pg (ref 26.0–34.0)
MCHC: 32.7 g/dL (ref 30.0–36.0)
MCV: 90.9 fL (ref 78.0–100.0)
PLATELETS: 460 10*3/uL — AB (ref 150–400)
RBC: 4.3 MIL/uL (ref 3.87–5.11)
RDW: 15.1 % (ref 11.5–15.5)
WBC: 7.4 10*3/uL (ref 4.0–10.5)

## 2018-03-22 LAB — DIFFERENTIAL
Abs Immature Granulocytes: 0 10*3/uL (ref 0.0–0.1)
Basophils Absolute: 0.1 10*3/uL (ref 0.0–0.1)
Basophils Relative: 1 %
EOS ABS: 0.1 10*3/uL (ref 0.0–0.7)
EOS PCT: 1 %
IMMATURE GRANULOCYTES: 0 %
LYMPHS PCT: 19 %
Lymphs Abs: 1.4 10*3/uL (ref 0.7–4.0)
MONO ABS: 0.9 10*3/uL (ref 0.1–1.0)
MONOS PCT: 12 %
Neutro Abs: 5 10*3/uL (ref 1.7–7.7)
Neutrophils Relative %: 67 %

## 2018-03-22 LAB — I-STAT CHEM 8, ED
BUN: 9 mg/dL (ref 6–20)
CALCIUM ION: 1.07 mmol/L — AB (ref 1.15–1.40)
CHLORIDE: 100 mmol/L — AB (ref 101–111)
Creatinine, Ser: 0.8 mg/dL (ref 0.44–1.00)
Glucose, Bld: 113 mg/dL — ABNORMAL HIGH (ref 65–99)
HCT: 38 % (ref 36.0–46.0)
HEMOGLOBIN: 12.9 g/dL (ref 12.0–15.0)
POTASSIUM: 3.8 mmol/L (ref 3.5–5.1)
SODIUM: 137 mmol/L (ref 135–145)
TCO2: 26 mmol/L (ref 22–32)

## 2018-03-22 LAB — COMPREHENSIVE METABOLIC PANEL
ALBUMIN: 3 g/dL — AB (ref 3.5–5.0)
ALT: 12 U/L — ABNORMAL LOW (ref 14–54)
ANION GAP: 11 (ref 5–15)
AST: 25 U/L (ref 15–41)
Alkaline Phosphatase: 83 U/L (ref 38–126)
BILIRUBIN TOTAL: 0.5 mg/dL (ref 0.3–1.2)
BUN: 8 mg/dL (ref 6–20)
CALCIUM: 8.8 mg/dL — AB (ref 8.9–10.3)
CO2: 26 mmol/L (ref 22–32)
Chloride: 100 mmol/L — ABNORMAL LOW (ref 101–111)
Creatinine, Ser: 0.99 mg/dL (ref 0.44–1.00)
GFR, EST NON AFRICAN AMERICAN: 58 mL/min — AB (ref >60)
Glucose, Bld: 115 mg/dL — ABNORMAL HIGH (ref 65–99)
POTASSIUM: 3.9 mmol/L (ref 3.5–5.1)
Sodium: 137 mmol/L (ref 135–145)
TOTAL PROTEIN: 6.6 g/dL (ref 6.5–8.1)

## 2018-03-22 LAB — ETHANOL

## 2018-03-22 LAB — I-STAT TROPONIN, ED: TROPONIN I, POC: 0.02 ng/mL (ref 0.00–0.08)

## 2018-03-22 LAB — URINALYSIS, ROUTINE W REFLEX MICROSCOPIC
Bilirubin Urine: NEGATIVE
GLUCOSE, UA: NEGATIVE mg/dL
Hgb urine dipstick: NEGATIVE
KETONES UR: NEGATIVE mg/dL
LEUKOCYTES UA: NEGATIVE
NITRITE: NEGATIVE
PH: 5 (ref 5.0–8.0)
Protein, ur: NEGATIVE mg/dL
Specific Gravity, Urine: 1.038 — ABNORMAL HIGH (ref 1.005–1.030)

## 2018-03-22 LAB — PROTIME-INR
INR: 0.97
PROTHROMBIN TIME: 12.8 s (ref 11.4–15.2)

## 2018-03-22 LAB — APTT: aPTT: 31 seconds (ref 24–36)

## 2018-03-22 LAB — RAPID URINE DRUG SCREEN, HOSP PERFORMED
Amphetamines: NOT DETECTED
Barbiturates: NOT DETECTED
Benzodiazepines: NOT DETECTED
Cocaine: NOT DETECTED
OPIATES: POSITIVE — AB
Tetrahydrocannabinol: POSITIVE — AB

## 2018-03-22 LAB — PROCALCITONIN: Procalcitonin: 0.33 ng/mL

## 2018-03-22 LAB — MRSA PCR SCREENING: MRSA by PCR: NEGATIVE

## 2018-03-22 SURGERY — IR WITH ANESTHESIA
Anesthesia: General

## 2018-03-22 MED ORDER — TICAGRELOR 60 MG PO TABS
ORAL_TABLET | ORAL | Status: DC | PRN
  Administered 2018-03-22: 180 mg

## 2018-03-22 MED ORDER — ROCURONIUM BROMIDE 100 MG/10ML IV SOLN
INTRAVENOUS | Status: DC | PRN
  Administered 2018-03-22: 40 mg via INTRAVENOUS
  Administered 2018-03-22: 30 mg via INTRAVENOUS

## 2018-03-22 MED ORDER — ACETAMINOPHEN 160 MG/5ML PO SOLN: 650.0000 mg | ORAL | Status: DC | PRN

## 2018-03-22 MED ORDER — STROKE: EARLY STAGES OF RECOVERY BOOK
Freq: Once | Status: DC
  Filled 2018-03-22: qty 1

## 2018-03-22 MED ORDER — SUGAMMADEX SODIUM 200 MG/2ML IV SOLN
INTRAVENOUS | Status: DC | PRN
  Administered 2018-03-22: 200 mg via INTRAVENOUS

## 2018-03-22 MED ORDER — ASPIRIN 325 MG PO TABS
ORAL_TABLET | ORAL | Status: AC
  Filled 2018-03-22: qty 1

## 2018-03-22 MED ORDER — ACETAMINOPHEN 650 MG RE SUPP: 650.0000 mg | RECTAL | Status: DC | PRN

## 2018-03-22 MED ORDER — SUCCINYLCHOLINE CHLORIDE 20 MG/ML IJ SOLN
INTRAMUSCULAR | Status: DC | PRN
  Administered 2018-03-22: 60 mg via INTRAVENOUS

## 2018-03-22 MED ORDER — TIOTROPIUM BROMIDE MONOHYDRATE 18 MCG IN CAPS
18.0000 ug | ORAL_CAPSULE | Freq: Every day | RESPIRATORY_TRACT | Status: DC
  Administered 2018-03-23 – 2018-03-24 (×2): 18 ug via RESPIRATORY_TRACT
  Filled 2018-03-22: qty 5

## 2018-03-22 MED ORDER — MONTELUKAST SODIUM 10 MG PO TABS
10.0000 mg | ORAL_TABLET | Freq: Every day | ORAL | Status: DC
  Administered 2018-03-23 – 2018-03-24 (×2): 10 mg via ORAL
  Filled 2018-03-22 (×2): qty 1

## 2018-03-22 MED ORDER — NICARDIPINE HCL IN NACL 20-0.86 MG/200ML-% IV SOLN
0.0000 mg/h | INTRAVENOUS | Status: DC
  Administered 2018-03-22: 5 mg/h via INTRAVENOUS

## 2018-03-22 MED ORDER — CLOPIDOGREL BISULFATE 300 MG PO TABS
ORAL_TABLET | ORAL | Status: AC
  Filled 2018-03-22: qty 1

## 2018-03-22 MED ORDER — VANCOMYCIN HCL 1000 MG IV SOLR
INTRAVENOUS | Status: DC | PRN
  Administered 2018-03-22: 1000 mg via INTRAVENOUS

## 2018-03-22 MED ORDER — LIDOCAINE HCL 1 % IJ SOLN
INTRAMUSCULAR | Status: AC
  Filled 2018-03-22: qty 20

## 2018-03-22 MED ORDER — HEPARIN SODIUM (PORCINE) 5000 UNIT/ML IJ SOLN
5000.0000 [IU] | Freq: Three times a day (TID) | INTRAMUSCULAR | Status: DC
  Administered 2018-03-22 – 2018-03-24 (×5): 5000 [IU] via SUBCUTANEOUS
  Filled 2018-03-22 (×5): qty 1

## 2018-03-22 MED ORDER — IOPAMIDOL (ISOVUE-370) INJECTION 76%
100.0000 mL | Freq: Once | INTRAVENOUS | Status: AC | PRN
  Administered 2018-03-22: 100 mL via INTRAVENOUS

## 2018-03-22 MED ORDER — ONDANSETRON HCL 4 MG/2ML IJ SOLN
4.0000 mg | Freq: Once | INTRAMUSCULAR | Status: AC
  Administered 2018-03-22: 4 mg via INTRAVENOUS
  Filled 2018-03-22: qty 2

## 2018-03-22 MED ORDER — ACETAMINOPHEN 325 MG PO TABS: 650.0000 mg | ORAL_TABLET | ORAL | Status: DC | PRN

## 2018-03-22 MED ORDER — NICARDIPINE HCL IN NACL 20-0.86 MG/200ML-% IV SOLN
INTRAVENOUS | Status: AC
Start: 2018-03-22 — End: 2018-03-23
  Filled 2018-03-22: qty 200

## 2018-03-22 MED ORDER — LIDOCAINE HCL (CARDIAC) PF 100 MG/5ML IV SOSY
PREFILLED_SYRINGE | INTRAVENOUS | Status: DC | PRN
  Administered 2018-03-22: 60 mg via INTRATRACHEAL

## 2018-03-22 MED ORDER — HEPARIN SODIUM (PORCINE) 1000 UNIT/ML IJ SOLN
INTRAMUSCULAR | Status: DC | PRN
  Administered 2018-03-22: 1000 [IU] via INTRAVENOUS

## 2018-03-22 MED ORDER — DEXAMETHASONE SODIUM PHOSPHATE 10 MG/ML IJ SOLN
INTRAMUSCULAR | Status: DC | PRN
  Administered 2018-03-22: 10 mg via INTRAVENOUS

## 2018-03-22 MED ORDER — SODIUM CHLORIDE 0.9 % IV SOLN
INTRAVENOUS | Status: DC
  Administered 2018-03-22: 20:00:00 via INTRAVENOUS

## 2018-03-22 MED ORDER — ASPIRIN 81 MG PO CHEW
CHEWABLE_TABLET | ORAL | Status: AC
  Filled 2018-03-22: qty 1

## 2018-03-22 MED ORDER — IOPAMIDOL (ISOVUE-300) INJECTION 61%
INTRAVENOUS | Status: AC
  Filled 2018-03-22: qty 50

## 2018-03-22 MED ORDER — EPTIFIBATIDE 20 MG/10ML IV SOLN
INTRAVENOUS | Status: DC | PRN
  Administered 2018-03-22 (×2): 1.5 mg via INTRAVENOUS

## 2018-03-22 MED ORDER — FENTANYL CITRATE (PF) 100 MCG/2ML IJ SOLN
INTRAMUSCULAR | Status: AC
  Filled 2018-03-22: qty 2

## 2018-03-22 MED ORDER — MOMETASONE FURO-FORMOTEROL FUM 200-5 MCG/ACT IN AERO
2.0000 | INHALATION_SPRAY | Freq: Two times a day (BID) | RESPIRATORY_TRACT | Status: DC
  Administered 2018-03-23 – 2018-03-24 (×3): 2 via RESPIRATORY_TRACT
  Filled 2018-03-22 (×2): qty 8.8

## 2018-03-22 MED ORDER — PROPOFOL 10 MG/ML IV BOLUS
INTRAVENOUS | Status: DC | PRN
  Administered 2018-03-22: 90 mg via INTRAVENOUS

## 2018-03-22 MED ORDER — EPTIFIBATIDE 20 MG/10ML IV SOLN
INTRAVENOUS | Status: AC
  Filled 2018-03-22: qty 10

## 2018-03-22 MED ORDER — IOHEXOL 300 MG/ML  SOLN: 60.0000 mL | Freq: Once | INTRAMUSCULAR | Status: DC | PRN

## 2018-03-22 MED ORDER — ALBUTEROL SULFATE (2.5 MG/3ML) 0.083% IN NEBU: 3.0000 mL | INHALATION_SOLUTION | Freq: Four times a day (QID) | RESPIRATORY_TRACT | Status: DC | PRN

## 2018-03-22 MED ORDER — FENTANYL CITRATE (PF) 250 MCG/5ML IJ SOLN
INTRAMUSCULAR | Status: DC | PRN
  Administered 2018-03-22 (×2): 25 ug via INTRAVENOUS

## 2018-03-22 MED ORDER — NITROGLYCERIN 1 MG/10 ML FOR IR/CATH LAB
INTRA_ARTERIAL | Status: AC
  Filled 2018-03-22: qty 10

## 2018-03-22 MED ORDER — TIROFIBAN HCL IN NACL 5-0.9 MG/100ML-% IV SOLN
INTRAVENOUS | Status: AC
  Filled 2018-03-22: qty 100

## 2018-03-22 MED ORDER — ASPIRIN EC 81 MG PO TBEC
DELAYED_RELEASE_TABLET | ORAL | Status: DC | PRN
  Administered 2018-03-22: 81 mg via ORAL

## 2018-03-22 MED ORDER — ONDANSETRON HCL 4 MG/2ML IJ SOLN
INTRAMUSCULAR | Status: DC | PRN
  Administered 2018-03-22: 4 mg via INTRAVENOUS

## 2018-03-22 MED ORDER — VANCOMYCIN HCL IN DEXTROSE 1-5 GM/200ML-% IV SOLN
INTRAVENOUS | Status: AC
  Filled 2018-03-22: qty 200

## 2018-03-22 MED ORDER — TICAGRELOR 90 MG PO TABS
ORAL_TABLET | ORAL | Status: AC
  Filled 2018-03-22: qty 2

## 2018-03-22 MED ORDER — SODIUM CHLORIDE 0.9 % IV SOLN
INTRAVENOUS | Status: DC
  Administered 2018-03-22 (×3): via INTRAVENOUS

## 2018-03-22 MED ORDER — PHENYLEPHRINE HCL 10 MG/ML IJ SOLN
INTRAMUSCULAR | Status: DC | PRN
  Administered 2018-03-22: 40 ug/min via INTRAVENOUS

## 2018-03-22 NOTE — Sedation Documentation (Signed)
Bedrest to start at 1615

## 2018-03-22 NOTE — Progress Notes (Signed)
Quarter size fresh blood noted at the 2200 groin site check on the right side. No hematoma and pulses palpable. Dr. Corliss Skains notified. Held pressure for 20 minutes and marked old blood drainage. Will continue to monitor.

## 2018-03-22 NOTE — Anesthesia Procedure Notes (Signed)
Arterial Line Insertion Start/End5/29/2019 2:25 AM, 03/22/2018 2:30 PM Performed by: CRNA  Patient location: OOR procedure area. Preanesthetic checklist: patient identified, IV checked, site marked, risks and benefits discussed, surgical consent, monitors and equipment checked, pre-op evaluation, timeout performed and anesthesia consent Left, radial was placed Catheter size: 20 G Hand hygiene performed , maximum sterile barriers used  and Seldinger technique used Allen's test indicative of satisfactory collateral circulation Attempts: 3 Procedure performed without using ultrasound guided technique. Following insertion, dressing applied and Biopatch. Post procedure assessment: normal  Patient tolerated the procedure well with no immediate complications.

## 2018-03-22 NOTE — Sedation Documentation (Signed)
Report given to PACU nurse Rayfield Citizen, RN. Pulses and wound handed off and assessed.

## 2018-03-22 NOTE — ED Notes (Signed)
Pt transported to IR 

## 2018-03-22 NOTE — ED Notes (Signed)
4 MG Zofran ordered verbally by Petra Kuba MD

## 2018-03-22 NOTE — Anesthesia Procedure Notes (Signed)
Procedure Name: Intubation Date/Time: 03/22/2018 1:50 PM Performed by: Adria Dill, CRNA Pre-anesthesia Checklist: Patient identified, Emergency Drugs available, Suction available and Patient being monitored Patient Re-evaluated:Patient Re-evaluated prior to induction Oxygen Delivery Method: Circle system utilized Preoxygenation: Pre-oxygenation with 100% oxygen Induction Type: IV induction, Rapid sequence and Cricoid Pressure applied Ventilation: Mask ventilation without difficulty Laryngoscope Size: Miller and 2 Grade View: Grade I Tube type: Subglottic suction tube Tube size: 7.5 mm Number of attempts: 1 Airway Equipment and Method: Stylet Secured at: 20 cm Tube secured with: Tape Dental Injury: Teeth and Oropharynx as per pre-operative assessment

## 2018-03-22 NOTE — ED Triage Notes (Signed)
Per EMS: pt from home with husband.  Husband was with pt at 1205, left the room, when he returned at 1210 he noticed pt was flaccid on left side along with left sided facial droop.  Pt had a fall last week that was unwitnessed and was not evaluated afterwards.  Pt's husband stated pt was completely normal this am.  PTA vitals: BP 110/50, CBG 205, HR 70.

## 2018-03-22 NOTE — Progress Notes (Signed)
Patient in IR,unableto document NIH at this time. Patient uncooperative.  Patient under the care of anesthesia.

## 2018-03-22 NOTE — Progress Notes (Signed)
Patient ID: Pam Johnson, female   DOB: 1951/06/23, 67 y.o.   MRN: [id number] INR  67 yr old lt Handed F LSW  123.05pm. Acute onset of Lt sided flaccid weakness and .Lt facial droop. 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 CTP hypoperfusion of entire RT cerebral hemisphere. Option of  revascularization of occluded symptomatic RT ICA to prevent further neurological  Injury discussed with husband and daughter.. The procedure ,reasons risks and alternatives were reviewed. Risk of ICH of 5 to 10 % ,worsening neuro deficit vent dependency death ,vascular injury and inability to revascularize were all discussed .questions were answered to  their satisfaction. Informed witnessed consent was obtained.for  RT carotid revascularization. S.Kacee Sukhu MD

## 2018-03-22 NOTE — ED Provider Notes (Addendum)
[facility] EMERGENCY DEPARTMENT Provider Note   CSN: [id number] Arrival date & time: 03/22/18  1256     History   Chief Complaint Chief Complaint  Patient presents with  . Code Stroke    HPI Pam Johnson is a 67 y.o. female.  The history is provided by the patient and the EMS personnel. No language interpreter was used.   Pam Johnson is a 67 y.o. female who presents to the Emergency Department complaining of stroke. She was last seen normal at noon today. Just after that her husband found her with flaccid left sided paralysis with neglect. She has been falling more recently over the last few weeks. And hit her head about a week ago. She has a mild headache. She denies any fevers, chest pain, vomiting, diarrhea. Symptoms are severe and constant nature. Past Medical History:  Diagnosis Date  . Acid reflux   . COPD (chronic obstructive pulmonary disease) (HCC)   . Hypertension   . Sacral fracture (HCC)   . Spinal stenosis     Patient Active Problem List   Diagnosis Date Noted  . 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 Pneumonia 10/18/2015    Past Surgical History:  Procedure Laterality Date  . ABDOMINAL HYSTERECTOMY    . CHOLECYSTECTOMY    . HERNIA REPAIR    . NISSEN FUNDOPLICATION       OB History   None      Home Medications    Prior to Admission medications   Medication Sig Start Date End Date Taking? Authorizing Provider  albuterol (PROVENTIL HFA;VENTOLIN HFA) 108 (90 BASE) MCG/ACT inhaler Inhale 2 puffs into the lungs every 6 (six) hours as needed for wheezing or shortness of breath.    [provider]  amLODipine (NORVASC) 2.5 MG tablet Take 2.5 mg by mouth daily.    [provider]  doxycycline (VIBRAMYCIN) 100 MG capsule Take 1 capsule (100 mg total) by mouth 2 (two) times daily. 10/24/15   Sharman Cheek, MD  esomeprazole (NEXIUM) 40 MG capsule Take 40 mg by mouth 2 (two) times daily.    [provider]  Fluticasone-Salmeterol (ADVAIR) 500-50 MCG/DOSE AEPB Inhale 1 puff into the lungs 2 (two) times daily.    [provider]  gabapentin (NEURONTIN) 600 MG tablet Take 600 mg by mouth 3 (three) times daily.    [provider]  hydrochlorothiazide (HYDRODIURIL) 25 MG tablet Take 25 mg by mouth daily.    [provider]  HYDROcodone-acetaminophen (NORCO) 7.5-325 MG tablet Take 1 tablet by mouth 4 (four) times daily.    [provider]  ipratropium-albuterol (DUONEB) 0.5-2.5 (3) MG/3ML SOLN Take 3 mLs by nebulization every 6 (six) hours. 10/20/15   Gale Journey, MD  montelukast (SINGULAIR) 10 MG tablet Take 10 mg by mouth every morning.    [provider]  ondansetron (ZOFRAN ODT) 4 MG disintegrating tablet Take 1 tablet (4 mg total) by mouth every 8 (eight) hours as needed for nausea or vomiting. 10/24/15   Sharman Cheek, MD  oxyCODONE (OXYCONTIN) 20 mg 12 hr tablet Take 20 mg by mouth 3 (three) times daily.    [provider]  predniSONE (DELTASONE) 10 MG tablet Take five tablets a day (50 mg) for 3 days,  Then four tablets a day (40 mg) for 3 days  Then three tablets a day (30 mg) for 3 days  Then two tablets a day (20 mg) for 5 days  Then one tablet  a day (10 mg) for 5 days 10/24/15   Sharman Cheek, MD  tiotropium [facility]) 18 MCG inhalation capsule Place 18 mcg into inhaler and inhale daily.    [provider]    Family History Family History  Problem Relation Age of Onset  . Hypertension Mother   . Hypertension Father     Social History Social History   Tobacco Use  . Smoking status: Current Every Day Smoker    Packs/day: 1.00    Types: Cigarettes  Substance Use Topics  . Alcohol use: Yes    Comment: occ  . Drug use: No     Allergies   Acyclovir and related; Ciprofloxacin; Ciprofloxacin; Penicillins; and Penicillins   Review of Systems Review of Systems  All other systems reviewed  and are negative.    Physical Exam Updated Vital Signs BP (!) 100/50   Pulse 72   Temp 98.5 F (36.9 C) (Oral)   Resp 13   SpO2 95%   Physical Exam  Constitutional: She is oriented to person, place, and time. She appears well-developed and well-nourished.  HENT:  Head: Normocephalic.  Ecchymosis to left forehead  Cardiovascular: Normal rate and regular rhythm.  No murmur heard. Pulmonary/Chest: Effort normal and breath sounds normal. No respiratory distress.  Abdominal: Soft. There is no tenderness. There is no rebound and no guarding.  Musculoskeletal: She exhibits no edema or tenderness.  2+ right femoral pulse. Faint left femoral pulse. Absent DP pulses bilaterally.  Neurological: She is alert and oriented to person, place, and time.  Left sided hemiparesis. Right-sided gaze preference with neglect.  Skin: Skin is warm and dry.  Psychiatric: She has a normal mood and affect. Her behavior is normal.  Nursing note and vitals reviewed.    ED Treatments / Results  Labs (all labs ordered are listed, but only abnormal results are displayed) Labs Reviewed  CBC - Abnormal; Notable for the following components:      Result Value   Platelets 460 (*)    All other components within normal limits  COMPREHENSIVE METABOLIC PANEL - Abnormal; Notable for the following components:   Chloride 100 (*)    Glucose, Bld 115 (*)    Calcium 8.8 (*)    Albumin 3.0 (*)    ALT 12 (*)    GFR calc non Af Amer 58 (*)    All other components within normal limits  RAPID URINE DRUG SCREEN, HOSP PERFORMED - Abnormal; Notable for the following components:   Opiates POSITIVE (*)    Tetrahydrocannabinol POSITIVE (*)    All other components within normal limits  URINALYSIS, ROUTINE W REFLEX MICROSCOPIC - Abnormal; Notable for the following components:   Specific Gravity, Urine 1.038 (*)    All other components within normal limits  I-STAT CHEM 8, ED - Abnormal; Notable for the following  components:   Chloride 100 (*)    Glucose, Bld 113 (*)    Calcium, Ion 1.07 (*)    All other components within normal limits  ETHANOL  PROTIME-INR  APTT  DIFFERENTIAL  PROCALCITONIN  I-STAT CHEM 8, ED  I-STAT TROPONIN, ED    EKG None  Radiology Ct Angio Head W Or Wo Contrast  Result Date: 03/22/2018 CLINICAL DATA:  Right MCA syndrome EXAM: CT ANGIOGRAPHY HEAD AND NECK CT PERFUSION BRAIN TECHNIQUE: Multidetector CT imaging of the head and neck was performed using the standard protocol during bolus administration of intravenous contrast. Multiplanar CT image reconstructions and MIPs were obtained to evaluate the  vascular anatomy. Carotid stenosis measurements (when applicable) are obtained utilizing NASCET criteria, using the distal internal carotid diameter as the denominator. Multiphase CT imaging of the brain was performed following IV bolus contrast injection. Subsequent parametric perfusion maps were calculated using RAPID software. CONTRAST:  Dose not currently available COMPARISON:  None. FINDINGS: CTA NECK FINDINGS Aortic arch: Extensive atherosclerotic plaque.  No dissection. Right carotid system: Bulky, irregular calcified plaque at the brachiocephalic origin. Prominent noncalcified plaque primarily on the anterior wall the common carotid with up to 50% stenosis. Bulky plaque at the bifurcation of the common carotid with ICA occlusion. The non-opacified vessel is difficult to distinguish and may be collapsed from chronic occlusion. Left carotid system: Diffuse atherosclerotic plaque. Proximal common carotid stenosis of 50% on coronal reformats. Bulky calcified plaque at the ICA bulb causes ~80% stenosis. Negative for ulceration. Vertebral arteries: Extensive calcified plaque along the proximal left subclavian with nonvisualized lumen in places due to degree of stenosis and calcified plaque blooming. Beyond the vertebral origin there is additional bulky calcified plaque with at least 50%  stenosis before and at the rib crossing (quantification is limited given the extent of disease which makes it difficult to determine baseline diameter). The left vertebral artery is occluded at the V2 segment. There is severe proximal right vertebral stenosis due to calcified plaque, with non visible lumen. Right proximal subclavian stenosis measures up to 50%. Skeleton: No acute finding. Severe cervical spine disc and facet degeneration with multilevel listhesis and kyphotic deformity. Other neck: No acute finding. Upper chest: Emphysema Review of the MIP images confirms the above findings CTA HEAD FINDINGS Anterior circulation: Right ICA occlusion to the level of the terminus where there is reconstitution presumably via the small P1 segment and a tiny anterior communicating artery. Right MCA branches are under filled but symmetrically opacified. Symmetric bilateral ACA flow. Atherosclerotic plaque on the left carotid siphon with mild-to-moderate narrowing. No left-sided branch occlusion is seen. Posterior circulation: Robust flow in the right vertebral artery. Presumed retrograde flow in the left vertebral artery. Both picas are enhancing. There is moderate narrowing and calcified plaque at the left V4 segment. Hypoplastic right P1 segment. Under filled right PCA branches. Venous sinuses: Not yet opacified Anatomic variants: Fetal type right PCA Delayed phase: Not obtained in the emergent setting Review of the MIP images confirms the above findings CT Brain Perfusion Findings: CBF (<30%) Volume: 33mL Perfusion (Tmax>6.0s) volume: Mismatch Volume: Infarction Location:The infarct by CT perfusion encompasses the low-density completed infarct by noncontrast CT. It also includes areas along the upper watershed that were not clearly seen by CT. The penumbra calculation includes ACA territory. At time of head CT call report, Dr Amada Jupiter was already aware of RICA occlusion. Communication of perfusion  findings is underway. 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 Limited major collateral pathways-fetal type right PCA with hypoplastic right P1 segment and probable tiny anterior communicating artery. 33 cc infarct in the right MCA/PCA distribution, which includes the area of completed infarct by noncontrast CT and additional parenchyma along the watershed. Nearly the entire right cerebral hemisphere is in the penumbra range (336 cc). Severe underlying atherosclerotic disease: *Severe proximal left subclavian stenosis. 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 *Severe right vertebral artery origin stenosis. *~80% proximal left ICA stenosis. Nearly 50% proximal left common carotid stenosis. *~50% stenosis at the right common carotid origin. Emphysema. Electronically Signed   By: Marja Kays  Watts M.D.   On: 03/22/2018 13:45   Ct Angio Neck W Or Wo Contrast  Result Date: 03/22/2018 CLINICAL DATA:  Right MCA syndrome EXAM: CT ANGIOGRAPHY HEAD AND NECK CT PERFUSION BRAIN TECHNIQUE: Multidetector CT imaging of the head and neck was performed using the standard protocol during bolus administration of intravenous contrast. Multiplanar CT image reconstructions and MIPs were obtained to evaluate the vascular anatomy. Carotid stenosis measurements (when applicable) are obtained utilizing NASCET criteria, using the distal internal carotid diameter as the denominator. Multiphase CT imaging of the brain was performed following IV bolus contrast injection. Subsequent parametric perfusion maps were calculated using RAPID software. CONTRAST:  Dose not currently available COMPARISON:  None. FINDINGS: CTA NECK FINDINGS Aortic arch: Extensive atherosclerotic plaque.  No dissection. Right carotid system: Bulky, irregular calcified plaque at the brachiocephalic origin. Prominent noncalcified plaque primarily on the  anterior wall the common carotid with up to 50% stenosis. Bulky plaque at the bifurcation of the common carotid with ICA occlusion. The non-opacified vessel is difficult to distinguish and may be collapsed from chronic occlusion. Left carotid system: Diffuse atherosclerotic plaque. Proximal common carotid stenosis of 50% on coronal reformats. Bulky calcified plaque at the ICA bulb causes ~80% stenosis. Negative for ulceration. Vertebral arteries: Extensive calcified plaque along the proximal left subclavian with nonvisualized lumen in places due to degree of stenosis and calcified plaque blooming. Beyond the vertebral origin there is additional bulky calcified plaque with at least 50% stenosis before and at the rib crossing (quantification is limited given the extent of disease which makes it difficult to determine baseline diameter). The left vertebral artery is occluded at the V2 segment. There is severe proximal right vertebral stenosis due to calcified plaque, with non visible lumen. Right proximal subclavian stenosis measures up to 50%. Skeleton: No acute finding. Severe cervical spine disc and facet degeneration with multilevel listhesis and kyphotic deformity. Other neck: No acute finding. Upper chest: Emphysema Review of the MIP images confirms the above findings CTA HEAD FINDINGS Anterior circulation: Right ICA occlusion to the level of the terminus where there is reconstitution presumably via the small P1 segment and a tiny anterior communicating artery. Right MCA branches are under filled but symmetrically opacified. Symmetric bilateral ACA flow. Atherosclerotic plaque on the left carotid siphon with mild-to-moderate narrowing. No left-sided branch occlusion is seen. Posterior circulation: Robust flow in the right vertebral artery. Presumed retrograde flow in the left vertebral artery. Both picas are enhancing. There is moderate narrowing and calcified plaque at the left V4 segment. Hypoplastic right P1  segment. Under filled right PCA branches. Venous sinuses: Not yet opacified Anatomic variants: Fetal type right PCA Delayed phase: Not obtained in the emergent setting Review of the MIP images confirms the above findings CT Brain Perfusion Findings: CBF (<30%) Volume: 33mL Perfusion (Tmax>6.0s) volume: Mismatch Volume: Infarction Location:The infarct by CT perfusion encompasses the low-density completed infarct by noncontrast CT. It also includes areas along the upper watershed that were not clearly seen by CT. The penumbra calculation includes ACA territory. At time of head CT call report, Dr Amada Jupiter was already aware of RICA occlusion. Communication of perfusion findings is underway. 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 Limited major collateral pathways-fetal type right PCA with hypoplastic right P1 segment and probable tiny anterior communicating artery. 33 cc infarct in the right MCA/PCA distribution, which includes the area of completed infarct by noncontrast CT and additional parenchyma along the watershed.  Nearly the entire right cerebral hemisphere is in the penumbra range (336 cc). Severe underlying atherosclerotic disease: *Severe proximal left subclavian stenosis. 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 *Severe right vertebral artery origin stenosis. *~80% proximal left ICA stenosis. Nearly 50% proximal left common carotid stenosis. *~50% stenosis at the right common carotid origin. Emphysema. Electronically Signed   By: Marnee Spring M.D.   On: 03/22/2018 13:45   Ct Cerebral Perfusion W Contrast  Result Date: 03/22/2018 CLINICAL DATA:  Right MCA syndrome EXAM: CT ANGIOGRAPHY HEAD AND NECK CT PERFUSION BRAIN TECHNIQUE: Multidetector CT imaging of the head and neck was performed using the standard protocol during bolus administration of intravenous contrast.  Multiplanar CT image reconstructions and MIPs were obtained to evaluate the vascular anatomy. Carotid stenosis measurements (when applicable) are obtained utilizing NASCET criteria, using the distal internal carotid diameter as the denominator. Multiphase CT imaging of the brain was performed following IV bolus contrast injection. Subsequent parametric perfusion maps were calculated using RAPID software. CONTRAST:  Dose not currently available COMPARISON:  None. FINDINGS: CTA NECK FINDINGS Aortic arch: Extensive atherosclerotic plaque.  No dissection. Right carotid system: Bulky, irregular calcified plaque at the brachiocephalic origin. Prominent noncalcified plaque primarily on the anterior wall the common carotid with up to 50% stenosis. Bulky plaque at the bifurcation of the common carotid with ICA occlusion. The non-opacified vessel is difficult to distinguish and may be collapsed from chronic occlusion. Left carotid system: Diffuse atherosclerotic plaque. Proximal common carotid stenosis of 50% on coronal reformats. Bulky calcified plaque at the ICA bulb causes ~80% stenosis. Negative for ulceration. Vertebral arteries: Extensive calcified plaque along the proximal left subclavian with nonvisualized lumen in places due to degree of stenosis and calcified plaque blooming. Beyond the vertebral origin there is additional bulky calcified plaque with at least 50% stenosis before and at the rib crossing (quantification is limited given the extent of disease which makes it difficult to determine baseline diameter). The left vertebral artery is occluded at the V2 segment. There is severe proximal right vertebral stenosis due to calcified plaque, with non visible lumen. Right proximal subclavian stenosis measures up to 50%. Skeleton: No acute finding. Severe cervical spine disc and facet degeneration with multilevel listhesis and kyphotic deformity. Other neck: No acute finding. Upper chest: Emphysema Review of the MIP  images confirms the above findings CTA HEAD FINDINGS Anterior circulation: Right ICA occlusion to the level of the terminus where there is reconstitution presumably via the small P1 segment and a tiny anterior communicating artery. Right MCA branches are under filled but symmetrically opacified. Symmetric bilateral ACA flow. Atherosclerotic plaque on the left carotid siphon with mild-to-moderate narrowing. No left-sided branch occlusion is seen. Posterior circulation: Robust flow in the right vertebral artery. Presumed retrograde flow in the left vertebral artery. Both picas are enhancing. There is moderate narrowing and calcified plaque at the left V4 segment. Hypoplastic right P1 segment. Under filled right PCA branches. Venous sinuses: Not yet opacified Anatomic variants: Fetal type right PCA Delayed phase: Not obtained in the emergent setting Review of the MIP images confirms the above findings CT Brain Perfusion Findings: CBF (<30%) Volume: 33mL Perfusion (Tmax>6.0s) volume: Mismatch Volume: Infarction Location:The infarct by CT perfusion encompasses the low-density completed infarct by noncontrast CT. It also includes areas along the upper watershed that were not clearly seen by CT. The penumbra calculation includes ACA territory. At time of head CT call report, Dr Amada Jupiter was already aware of RICA  occlusion. Communication of perfusion findings is underway. 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 Limited major collateral pathways-fetal type right PCA with hypoplastic right P1 segment and probable tiny anterior communicating artery. 33 cc infarct in the right MCA/PCA distribution, which includes the area of completed infarct by noncontrast CT and additional parenchyma along the watershed. Nearly the entire right cerebral hemisphere is in the penumbra range (336 cc). Severe underlying atherosclerotic disease: *Severe  proximal left subclavian stenosis. 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 *Severe right vertebral artery origin stenosis. *~80% proximal left ICA stenosis. Nearly 50% proximal left common carotid stenosis. *~50% stenosis at the right common carotid origin. Emphysema. Electronically Signed   By: Marnee Spring M.D.   On: 03/22/2018 13:45   Ct Head Code Stroke Wo Contrast  Result Date: 03/22/2018 CLINICAL DATA:  Code stroke. Left-sided weakness. Last seen normal 12 10 EXAM: CT HEAD WITHOUT CONTRAST TECHNIQUE: Contiguous axial images were obtained from the base of the skull through the vertex without intravenous contrast. COMPARISON:  08/30/2006 FINDINGS: Brain: Moderate area of cytotoxic edema in the right occipital lobe. More well-defined cortical and subcortical low-density in the high anterior right frontal lobe, also affecting a moderate area, likely pre-existing infarct. There is mild presumed small vessel ischemic change in the cerebral white matter. No hemorrhage. No hydrocephalus. Vascular: Atherosclerotic calcification.  No high-density vessel. Skull: Negative Sinuses/Orbits: Negative Other: These results were called by telephone at the time of interpretation on 03/22/2018 at 1:13 pm to Dr. Leonarda Salon, who was already aware. ASPECTS [facility] Stroke Program Early CT Score) - Ganglionic level infarction (caudate, lentiform nuclei, internal capsule, insula, M1-M3 cortex): 7 - Supraganglionic infarction (M4-M6 cortex): 1-2 Total score (0-10 with 10 being normal): 8-9 IMPRESSION: 1. 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 2. Favored remote anterior right frontal cortex and white matter infarct. 3. Negative for hemorrhage. Electronically Signed   By: Marnee Spring M.D.   On: 03/22/2018 13:17    Procedures Procedures (including critical care time)  Medications Ordered in ED Medications  ticagrelor (BRILINTA) 90 MG tablet (has no administration in  time range)  aspirin 325 MG tablet (has no administration in time range)  nitroGLYCERIN 100 MCG/ML intra-arterial injection (has no administration in time range)  eptifibatide (INTEGRILIN) 20 MG/10ML injection (has no administration in time range)  aspirin 81 MG chewable tablet (has no administration in time range)   stroke: mapping our early stages of recovery book (has no administration in time range)  0.9 %  sodium chloride infusion ( Intravenous New Bag/Given 03/22/18 1402)  acetaminophen (TYLENOL) tablet 650 mg (has no administration in time range)    Or  acetaminophen (TYLENOL) solution 650 mg (has no administration in time range)    Or  acetaminophen (TYLENOL) suppository 650 mg (has no administration in time range)  heparin injection 5,000 Units (has no administration in time range)  albuterol (PROVENTIL) (2.5 MG/3ML) 0.083% nebulizer solution 3 mL (has no administration in time range)  tiotropium (SPIRIVA) inhalation capsule 18 mcg (has no administration in time range)  montelukast (SINGULAIR) tablet 10 mg (has no administration in time range)  mometasone-formoterol (DULERA) 200-5 MCG/ACT inhaler 2 puff (has no administration in time range)  vancomycin (VANCOCIN) 1-5 GM/200ML-% IVPB (has no administration in time range)  aspirin EC tablet (81 mg Oral Given 03/22/18 1348)  ticagrelor (BRILINTA) tablet (180 mg Per Tube Given 03/22/18 1423)  eptifibatide (INTEGRILIN) injection (1.5  mg Intravenous Given 03/22/18 1515)  iohexol (OMNIPAQUE) 300 MG/ML solution 60 mL (has no administration in time range)  iopamidol (ISOVUE-370) 76 % injection 100 mL (100 mLs Intravenous Contrast Given 03/22/18 1321)  ondansetron (ZOFRAN) injection 4 mg (4 mg Intravenous Given 03/22/18 1335)  fentaNYL (SUBLIMAZE) 100 MCG/2ML injection (  Override pull for Anesthesia 03/22/18 1640)     Initial Impression / Assessment and Plan / ED Course  I have reviewed the triage vital signs and the nursing notes.  Pertinent  labs & imaging results that were available during my care of the patient were reviewed by me and considered in my medical decision making (see chart for details).     Patient presented to the emergency department for evaluation of left sided weakness. Code stroke activated by EMS prior to ED arrival. CT scanned did confirm acute CVA. She was hypotensive on ED arrival, treated with IV fluids. Patient was transferred emergently to interventional radiology for further treatment.  Final Clinical Impressions(s) / ED Diagnoses   Final diagnoses:  Acute embolic stroke [facility])  Aspiration into airway    ED Discharge Orders    None       Tilden Fossa, MD 03/22/18 1647    Tilden Fossa, MD 04/04/18 [phone]

## 2018-03-22 NOTE — Anesthesia Preprocedure Evaluation (Signed)
Anesthesia Evaluation  Patient identified by MRN, date of birth, ID band Patient confused    Reviewed: Unable to perform ROS - Chart review onlyPreop documentation limited or incomplete due to emergent nature of procedure.  Airway Mallampati: II  TM Distance: >3 FB Neck ROM: Full    Dental  (+) Upper Dentures, Lower Dentures   Pulmonary COPD, Current Smoker,    breath sounds clear to auscultation       Cardiovascular hypertension, Pt. on medications  Rhythm:Regular     Neuro/Psych CVA, Residual Symptoms    GI/Hepatic Neg liver ROS, GERD  Medicated,  Endo/Other  negative endocrine ROS  Renal/GU negative Renal ROS     Musculoskeletal negative musculoskeletal ROS (+)   Abdominal   Peds  Hematology negative hematology ROS (+)   Anesthesia Other Findings Left hemiparesis   Reproductive/Obstetrics                             Anesthesia Physical Anesthesia Plan  ASA: IV  Anesthesia Plan: General   Post-op Pain Management:    Induction: Intravenous  PONV Risk Score and Plan: 2 and Ondansetron and Dexamethasone  Airway Management Planned: Oral ETT  Additional Equipment:   Intra-op Plan:   Post-operative Plan: Extubation in OR and Possible Post-op intubation/ventilation  Informed Consent:   Only emergency history available and History available from chart only  Plan Discussed with: CRNA  Anesthesia Plan Comments:         Anesthesia Quick Evaluation

## 2018-03-22 NOTE — Code Documentation (Signed)
67 yo Female coming from home where she was talking to her family. Family reports that she was at baseline at 41 when husband was talking with patient, and then when he returned to the room at 1210, pt was flaccid on the left side and did not even recognize it was happening. Pt has had multiple falls recently. Family called EMS and Paramedics activated a Code Stroke. Stroke Team met patient at the bridge. Initial NIHSS 13 due to left facial droop, weakness noted in the left arm and leg, neglect of the left side, complete hemianopsia of the left visual field, and some weakness on the right secondary to her right hip replacement. Pt was alert, but did not know age. She was oriented to place and situation. CT showed signs of old infarct, and pt's husband reported recent stroke. Pt not given tPA due to recent stroke. CTA/CTP completed. While in CT, pt's BP noted to be 88/56. Pt taken back to room to evaluate and to monitor BP. Pt was given NS Bolus. BP monitored in the room with no improvement. Manual attempted and pt noted to have systolic BP of 92. Delay to IR due to BP monitoring, EDP assessing pulses, and foley being placed by ED staff. Pt taken to IR. To be admitted to [facility].

## 2018-03-22 NOTE — Transfer of Care (Signed)
Immediate Anesthesia Transfer of Care Note  Patient: Pam Johnson  Procedure(s) Performed: IR WITH ANESTHESIA (N/A )  Patient Location: PACU  Anesthesia Type:General  Level of Consciousness: awake, alert  and oriented  Airway & Oxygen Therapy: Patient Spontanous Breathing and Patient connected to face mask oxygen  Post-op Assessment: Report given to RN and Post -op Vital signs reviewed and stable  Post vital signs: Reviewed and stable  Last Vitals:  Vitals Value Taken Time  BP 180/109 03/22/2018  4:55 PM  Temp    Pulse 79 03/22/2018  4:56 PM  Resp 14 03/22/2018  4:56 PM  SpO2 99 % 03/22/2018  4:56 PM  Vitals shown include unvalidated device data.  Last Pain:  Vitals:   03/22/18 1329  TempSrc:   PainSc: 0-No pain         Complications: No apparent anesthesia complications

## 2018-03-22 NOTE — H&P (Addendum)
Requesting Physician: Dr. Pecola Leisure    Chief Complaint: Code stroke  History obtained from:  Patient   and husband along with EMS  HPI:                                                                                                                                         Pam Johnson is an 67 y.o. female with history of spinal stenosis, hypotension.  Per med rec patient is not on any antiplatelet.  She is neither on any anticoagulant.  Per husband patient was fine at 1205.  He left the room, upon entering the room he noted that she was flaccid on the left in addition she had a left facial droop.  He is also noted over the past week or 2 that she has been falling to the left and she has also been bumping into objects on the left.  Immediate CT was obtained secondary to code stroke which revealed a right occipital infarct that was well-demarcated and likely subacute within last couple weeks.  CTA was obtained which did show a left occluded internal carotid artery.  While in CT scanner patient was showing hypotensive with blood pressure with systolic reading in the 88.  Abdominal ultrasound was obtained however due to difficulty with gas in the belly it was hard to obtain a good study to look for an abdominal aortic aneurysm.  Due to patient having a significant neglect on the left patient was unable to do some of the exam.  At this time, patient will be brought to interventional radiology for further evaluation  Date last known well: Date: 03/22/2018 Time last known well: Time: 12:05 tPA Given: No: CVA within the last 3 months NIHSS 13 Modified Rankin: Rankin Score=0    Past Medical History:  Diagnosis Date  . Acid reflux   . COPD (chronic obstructive pulmonary disease) (HCC)   . Hypertension   . Sacral fracture (HCC)   . Spinal stenosis     Past Surgical History:  Procedure Laterality Date  . ABDOMINAL HYSTERECTOMY    . CHOLECYSTECTOMY    . HERNIA REPAIR    . NISSEN FUNDOPLICATION       Family History  Problem Relation Age of Onset  . Hypertension Mother   . Hypertension Father      Social History:  reports that she has been smoking cigarettes.  She has been smoking about 1.00 pack per day. She does not have any smokeless tobacco history on file. She reports that she drinks alcohol. She reports that she does not use drugs.  Allergies:  Allergies  Allergen Reactions  . Acyclovir And Related   . Ciprofloxacin Other (See Comments)    Altered mental status.  . Ciprofloxacin Other (See Comments)    Confused and nausea   . Penicillins Other (See Comments)    Unknown reaction Has patient had a PCN reaction  causing immediate rash, facial/tongue/throat swelling, SOB or lightheadedness with hypotension: NO Has patient had a PCN reaction causing severe rash involving mucus membranes or skin necrosis: NO Has patient had a PCN reaction that required hospitalization: NO Has patient had a PCN reaction occurring within the last 10 years: NO If all of the above answers are "NO", then may proceed with Cephalosporin use.   Marland Kitchen Penicillins Other (See Comments)    Was a child not sure of reaction     Medications:                                                                                                                           Current Facility-Administered Medications  Medication Dose Route Frequency Provider Last Rate Last Dose  . aspirin 325 MG tablet           . clopidogrel (PLAVIX) 300 MG tablet           . eptifibatide (INTEGRILIN) 20 MG/10ML injection           . lidocaine (XYLOCAINE) 1 % (with pres) injection           . nitroGLYCERIN 100 MCG/ML intra-arterial injection           . ondansetron (ZOFRAN) injection 4 mg  4 mg Intravenous Once Tilden Fossa, MD      . ticagrelor (BRILINTA) 90 MG tablet           . tirofiban (AGGRASTAT) 5-0.9 MG/100ML-% injection            Current Outpatient Medications  Medication Sig Dispense Refill  . albuterol (PROVENTIL  HFA;VENTOLIN HFA) 108 (90 BASE) MCG/ACT inhaler Inhale 2 puffs into the lungs every 6 (six) hours as needed for wheezing or shortness of breath.    Marland Kitchen amLODipine (NORVASC) 2.5 MG tablet Take 2.5 mg by mouth daily.    Marland Kitchen doxycycline (VIBRAMYCIN) 100 MG capsule Take 1 capsule (100 mg total) by mouth 2 (two) times daily. 28 capsule 0  . esomeprazole (NEXIUM) 40 MG capsule Take 40 mg by mouth 2 (two) times daily.    . Fluticasone-Salmeterol (ADVAIR) 500-50 MCG/DOSE AEPB Inhale 1 puff into the lungs 2 (two) times daily.    Marland Kitchen gabapentin (NEURONTIN) 600 MG tablet Take 600 mg by mouth 3 (three) times daily.    . hydrochlorothiazide (HYDRODIURIL) 25 MG tablet Take 25 mg by mouth daily.    Marland Kitchen HYDROcodone-acetaminophen (NORCO) 7.5-325 MG tablet Take 1 tablet by mouth 4 (four) times daily.    Marland Kitchen ipratropium-albuterol (DUONEB) 0.5-2.5 (3) MG/3ML SOLN Take 3 mLs by nebulization every 6 (six) hours. 360 mL 0  . montelukast (SINGULAIR) 10 MG tablet Take 10 mg by mouth every morning.    . ondansetron (ZOFRAN ODT) 4 MG disintegrating tablet Take 1 tablet (4 mg total) by mouth every 8 (eight) hours as needed for nausea or vomiting. 20 tablet 0  . oxyCODONE (OXYCONTIN) 20 mg 12 hr tablet Take 20 mg by mouth  3 (three) times daily.    . predniSONE (DELTASONE) 10 MG tablet Take five tablets a day (50 mg) for 3 days,  Then four tablets a day (40 mg) for 3 days  Then three tablets a day (30 mg) for 3 days  Then two tablets a day (20 mg) for 5 days  Then one tablet a day (10 mg) for 5 days 51 tablet 0  . tiotropium (SPIRIVA) 18 MCG inhalation capsule Place 18 mcg into inhaler and inhale daily.       ROS:                                                                                                                                       History obtained from the patient  General ROS: negative for - chills, fatigue, fever, night sweats, weight gain or weight loss Psychological ROS: negative for - , hallucinations, memory  difficulties, mood swings or  Ophthalmic ROS: Positive for -  loss of vision ENT ROS: negative for - epistaxis, nasal discharge, oral lesions, sore throat, tinnitus or vertigo Respiratory ROS: negative for - cough,  shortness of breath or wheezing Cardiovascular ROS: negative for - chest pain, dyspnea on exertion,  Gastrointestinal ROS: negative for - abdominal pain, diarrhea,  nausea/vomiting or stool incontinence Genito-Urinary ROS: negative for - dysuria, hematuria, incontinence or urinary frequency/urgency Musculoskeletal ROS: negative for - joint swelling or muscular weakness Neurological ROS: as noted in HPI   General Examination:                                                                                                      Blood pressure 100/70, pulse 82, temperature 98.5 F (36.9 C), temperature source Oral, resp. rate 12, SpO2 100 %.  HEENT-  Normocephalic, no lesions, without obvious abnormality.  Normal external eye and conjunctiva.   Lungs-no rhonchi or wheezing noted, no excessive working breathing.  Saturations within normal limits Abdomen- All 4 quadrants palpated and nontender Extremities- Warm, dry and intact Musculoskeletal-no joint tenderness, deformity or swelling Skin-warm and dry, no hyperpigmentation, vitiligo, or suspicious lesions  Neurological Examination Mental Status: Alert, not oriented.  Speech fluent without evidence of aphasia.  Able to follow simple commands without difficulty. Cranial Nerves: II: Left hemianopsia III,IV, VI: ptosis not present, patient is able to cross midline to the left however she does have a right gaze preference.  Pupils are equal round reactive to light.  They are approximately 2 mm.   V,VII: Left facial  droop, neglecting the left aspect of the face VIII: hearing normal bilaterally IX,X: uvula rises symmetrically XI: bilateral shoulder shrug XII: midline tongue extension Motor: Right : Upper extremity   5/5    Left:      Upper extremity   0/5  Lower extremity   5/5     Lower extremity   2/5 Tone and bulk:normal tone throughout; no atrophy noted Sensory: Sensation is intact however she does not neglect to the left aspect of her arm and leg Deep Tendon Reflexes: 2+ and symmetric throughout with no ankle jerk Plantars: Right: downgoing   Left: downgoing Cerebellar: Dysmetria noted on the right.  Patient was having difficulty with this thus had her touch her nose and point to the ceiling again no dysmetria was noted.  No dysmetria is noted with right foot over left shin Gait: Not tested   Lab Results: Basic Metabolic Panel: Recent Labs  Lab 03/22/18 1310  NA 137  K 3.8  CL 100*  GLUCOSE 113*  BUN 9  CREATININE 0.80    CBC: Recent Labs  Lab 03/22/18 1310 03/22/18 1317  WBC  --  7.4  NEUTROABS  --  5.0  HGB 12.9 12.8  HCT 38.0 39.1  MCV  --  90.9  PLT  --  460*    Lipid Panel: No results for input(s): CHOL, TRIG, HDL, CHOLHDL, VLDL, LDLCALC in the last 168 hours.  CBG: No results for input(s): GLUCAP in the last 168 hours.  Imaging: Ct Head Code Stroke Wo Contrast  Result Date: 03/22/2018 CLINICAL DATA:  Code stroke. Left-sided weakness. Last seen normal 12 10 EXAM: CT HEAD WITHOUT CONTRAST TECHNIQUE: Contiguous axial images were obtained from the base of the skull through the vertex without intravenous contrast. COMPARISON:  08/30/2006 FINDINGS: Brain: Moderate area of cytotoxic edema in the right occipital lobe. More well-defined cortical and subcortical low-density in the high anterior right frontal lobe, also affecting a moderate area, likely pre-existing infarct. There is mild presumed small vessel ischemic change in the cerebral white matter. No hemorrhage. No hydrocephalus. Vascular: Atherosclerotic calcification.  No high-density vessel. Skull: Negative Sinuses/Orbits: Negative Other: These results were called by telephone at the time of interpretation on 03/22/2018 at 1:13 pm to Dr.  Leonarda Salon, who was already aware. ASPECTS [facility] Stroke Program Early CT Score) - Ganglionic level infarction (caudate, lentiform nuclei, internal capsule, insula, M1-M3 cortex): 7 - Supraganglionic infarction (M4-M6 cortex): 1-2 Total score (0-10 with 10 being normal): 8-9 IMPRESSION: 1. 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 2. Favored remote anterior right frontal cortex and white matter infarct. 3. Negative for hemorrhage. Electronically Signed   By: Marnee Spring M.D.   On: 03/22/2018 13:17    Assessment and plan discussed with with attending physician and they are in agreement.     Assessment: 67 y.o. female stenting as code stroke with left-sided deficits including left arm flaccidity, left leg weakness, left facial droop, left-sided neglect.  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  Patient was brought to interventional radiology to further assess if any intervention could be done.   Stroke Risk Factors - smoking  Recommend --HgbA1c, fasting lipid panel --MRI of the brain without contrast --PT consult, OT consult, Speech consult --Echocardiogram --80 mg of Atorvistatin --Prophylactic therapy-Antiplatelet med: --Telemetry monitoring --Frequent neuro checks --NPO until passes stroke swallow screen --please page stroke NP  Or  PA  Or MD from 8am -4 pm  as this patient from  this time will be  followed by the stroke.   You can look them up on [email or web address]  Password [email or web address]  Brenlyn Beshara PA-C [facility] [phone]  03/22/2018, 1:34 PM    I have seen the patient reviewed the above note.  67 year old female who has been having trouble bumping into things for a couple of weeks who presents with sudden onset of left-sided weakness, neglect.  She was apparently washing clothes earlier today, but then shortly after 12 became acutely weak on the left side.  On arrival to the emergency department, she was slightly hypertensive, but  even when her pressures were in the 100s she was still fairly weak on the left side.  CTA revealed carotid occlusion as well as a subacute infarct in the right posterior parietal region.  I suspect she may have thrown an embolus last week some time and finally occluded today.  CT perfusion reveals large area of potential hypoperfusion and therefore she was taken to IR for consideration of carotid stenting.  Ritta Slot, MD [facility] [phone]  If 7pm- 7am, please page neurology on call as listed in AMION.

## 2018-03-22 NOTE — Sedation Documentation (Signed)
Patient dentures removed and placed with in denture cup in belongings bag.

## 2018-03-22 NOTE — Progress Notes (Signed)
Patient ID: Pam Johnson, female   DOB: 1950/12/02, 67 y.o.   MRN: [id number] INR   Patient extubated without difficulty. Maintaining airway and oxygenation. Lt groin soft.No hematoma..Distal pulses all dopplerable unchanged. Dyna CT brain revealed No ICH or mass effect or  ventricular dilatation. Patient ablr tp lift her lt arm against gravity. S.Aras Albarran MD

## 2018-03-22 NOTE — Sedation Documentation (Signed)
IR tech holding pressure at this time. 

## 2018-03-22 NOTE — Progress Notes (Signed)
Report received from IR. Oneka Parada, [place] Scrape, RN

## 2018-03-22 NOTE — Procedures (Signed)
S/P bilateral common carotid arteriograms followed by unsuccessful revascularization of acutely occluded  Symptomatic RT ICA prox .  approx 75 % stenosis of Lt ICA prox  Lt CFA approach.

## 2018-03-23 ENCOUNTER — Encounter (HOSPITAL_COMMUNITY): Payer: Self-pay | Admitting: Interventional Radiology

## 2018-03-23 ENCOUNTER — Inpatient Hospital Stay (HOSPITAL_COMMUNITY): Payer: Medicare Other

## 2018-03-23 DIAGNOSIS — I6521 Occlusion and stenosis of right carotid artery: Secondary | ICD-10-CM

## 2018-03-23 LAB — CBC WITH DIFFERENTIAL/PLATELET
Abs Immature Granulocytes: 0 10*3/uL (ref 0.0–0.1)
Basophils Absolute: 0 10*3/uL (ref 0.0–0.1)
Basophils Relative: 0 %
EOS PCT: 0 %
Eosinophils Absolute: 0 10*3/uL (ref 0.0–0.7)
HEMATOCRIT: 31.6 % — AB (ref 36.0–46.0)
HEMOGLOBIN: 10.3 g/dL — AB (ref 12.0–15.0)
Immature Granulocytes: 0 %
LYMPHS ABS: 1.2 10*3/uL (ref 0.7–4.0)
LYMPHS PCT: 22 %
MCH: 30 pg (ref 26.0–34.0)
MCHC: 32.6 g/dL (ref 30.0–36.0)
MCV: 92.1 fL (ref 78.0–100.0)
MONO ABS: 0.6 10*3/uL (ref 0.1–1.0)
Monocytes Relative: 11 %
Neutro Abs: 3.7 10*3/uL (ref 1.7–7.7)
Neutrophils Relative %: 67 %
Platelets: 383 10*3/uL (ref 150–400)
RBC: 3.43 MIL/uL — AB (ref 3.87–5.11)
RDW: 15.2 % (ref 11.5–15.5)
WBC: 5.5 10*3/uL (ref 4.0–10.5)

## 2018-03-23 LAB — BASIC METABOLIC PANEL
Anion gap: 9 (ref 5–15)
BUN: <5 mg/dL — ABNORMAL LOW (ref 6–20)
CHLORIDE: 110 mmol/L (ref 101–111)
CO2: 22 mmol/L (ref 22–32)
CREATININE: 0.48 mg/dL (ref 0.44–1.00)
Calcium: 7.7 mg/dL — ABNORMAL LOW (ref 8.9–10.3)
GFR calc non Af Amer: >60 mL/min (ref >60)
Glucose, Bld: 97 mg/dL (ref 65–99)
POTASSIUM: 3.7 mmol/L (ref 3.5–5.1)
Sodium: 141 mmol/L (ref 135–145)

## 2018-03-23 LAB — LIPID PANEL
CHOL/HDL RATIO: 2.1 ratio
Cholesterol: 132 mg/dL (ref 0–200)
HDL: 63 mg/dL (ref >40)
LDL Cholesterol: 44 mg/dL (ref 0–99)
Triglycerides: 124 mg/dL (ref <150)
VLDL: 25 mg/dL (ref 0–40)

## 2018-03-23 LAB — HEMOGLOBIN A1C
Hgb A1c MFr Bld: 5.6 % (ref 4.8–5.6)
MEAN PLASMA GLUCOSE: 114.02 mg/dL

## 2018-03-23 LAB — POCT ACTIVATED CLOTTING TIME: Activated Clotting Time: 180 seconds

## 2018-03-23 MED ORDER — ONDANSETRON HCL 4 MG/2ML IJ SOLN
4.0000 mg | Freq: Four times a day (QID) | INTRAMUSCULAR | Status: DC | PRN
Start: 2018-03-23 — End: 2018-03-24
  Administered 2018-03-23: 4 mg via INTRAVENOUS
  Filled 2018-03-23: qty 2

## 2018-03-23 MED ORDER — SODIUM CHLORIDE 0.9 % IV BOLUS
500.0000 mL | Freq: Once | INTRAVENOUS | Status: AC
  Administered 2018-03-23: 500 mL via INTRAVENOUS

## 2018-03-23 MED ORDER — ACETAMINOPHEN 325 MG PO TABS
650.0000 mg | ORAL_TABLET | Freq: Four times a day (QID) | ORAL | Status: DC | PRN
  Administered 2018-03-23 – 2018-03-24 (×3): 650 mg via ORAL
  Filled 2018-03-23 (×3): qty 2

## 2018-03-23 MED ORDER — SERTRALINE HCL 50 MG PO TABS
150.0000 mg | ORAL_TABLET | Freq: Every day | ORAL | Status: DC
  Administered 2018-03-23 – 2018-03-24 (×2): 150 mg via ORAL
  Filled 2018-03-23 (×2): qty 3

## 2018-03-23 MED ORDER — CLOPIDOGREL BISULFATE 75 MG PO TABS
75.0000 mg | ORAL_TABLET | Freq: Every day | ORAL | Status: DC
  Administered 2018-03-23 – 2018-03-24 (×2): 75 mg via ORAL
  Filled 2018-03-23 (×2): qty 1

## 2018-03-23 MED ORDER — ASPIRIN EC 81 MG PO TBEC
81.0000 mg | DELAYED_RELEASE_TABLET | Freq: Every day | ORAL | Status: DC
  Administered 2018-03-23 – 2018-03-24 (×2): 81 mg via ORAL
  Filled 2018-03-23 (×2): qty 1

## 2018-03-23 MED ORDER — METOCLOPRAMIDE HCL 5 MG PO TABS
5.0000 mg | ORAL_TABLET | Freq: Four times a day (QID) | ORAL | Status: DC
  Administered 2018-03-23 – 2018-03-24 (×4): 5 mg via ORAL
  Filled 2018-03-23 (×4): qty 1

## 2018-03-23 MED ORDER — FLUTICASONE PROPIONATE 50 MCG/ACT NA SUSP
2.0000 | Freq: Every day | NASAL | Status: DC | PRN
  Administered 2018-03-23: 2 via NASAL
  Filled 2018-03-23: qty 16

## 2018-03-23 MED ORDER — OLOPATADINE HCL 0.1 % OP SOLN
1.0000 [drp] | Freq: Two times a day (BID) | OPHTHALMIC | Status: DC
  Administered 2018-03-23 – 2018-03-24 (×3): 1 [drp] via OPHTHALMIC
  Filled 2018-03-23: qty 5

## 2018-03-23 MED ORDER — HALOPERIDOL LACTATE 5 MG/ML IJ SOLN
1.0000 mg | Freq: Four times a day (QID) | INTRAMUSCULAR | Status: DC | PRN
  Administered 2018-03-23: 1 mg via INTRAVENOUS
  Filled 2018-03-23: qty 1

## 2018-03-23 MED ORDER — MIRTAZAPINE 15 MG PO TABS
30.0000 mg | ORAL_TABLET | Freq: Every day | ORAL | Status: DC
  Administered 2018-03-23: 30 mg via ORAL
  Filled 2018-03-23: qty 2

## 2018-03-23 MED ORDER — HYDROCODONE-ACETAMINOPHEN 7.5-325 MG PO TABS
1.0000 | ORAL_TABLET | Freq: Four times a day (QID) | ORAL | Status: DC | PRN
  Administered 2018-03-23 (×2): 1 via ORAL
  Filled 2018-03-23 (×2): qty 1

## 2018-03-23 MED ORDER — ADULT MULTIVITAMIN W/MINERALS CH
1.0000 | ORAL_TABLET | Freq: Every day | ORAL | Status: DC
Start: 2018-03-23 — End: 2018-03-24
  Administered 2018-03-23 – 2018-03-24 (×2): 1 via ORAL
  Filled 2018-03-23 (×2): qty 1

## 2018-03-23 NOTE — Progress Notes (Signed)
Per Dr. Wilford Corner patient's SBP goals should be between 120-160.   SBP on a-line reading lower with appropriate wave form while patient sleeps. 500cc bolus ordered x2. Will continue to monitor.

## 2018-03-23 NOTE — Progress Notes (Signed)
I came to do the echo but the patient needed to have a bowel movement and needs two persons not available at the time to get her to the rest room. We will check back.

## 2018-03-23 NOTE — Progress Notes (Signed)
Inpatient Rehabilitation  Per OT/PT request, patient was screened by Emitt Maglione for appropriateness for an Inpatient Acute Rehab consult.  At this time we are recommending an Inpatient Rehab consult.  Please order if you are agreeable.  Kaushik Maul, M.A., CCC/SLP Admission Coordinator  [facility]  Cell [phone]    

## 2018-03-23 NOTE — Anesthesia Postprocedure Evaluation (Signed)
Anesthesia Post Note  Patient: Pam Johnson  Procedure(s) Performed: IR WITH ANESTHESIA (N/A )     Patient location during evaluation: PACU Anesthesia Type: General Level of consciousness: awake and patient cooperative Pain management: pain level controlled Vital Signs Assessment: post-procedure vital signs reviewed and stable Respiratory status: spontaneous breathing, nonlabored ventilation, respiratory function stable and patient connected to nasal cannula oxygen Cardiovascular status: stable Postop Assessment: no apparent nausea or vomiting Anesthetic complications: no    Last Vitals:  Vitals:   03/23/18 1710 03/23/18 1800  BP: (!) 157/108 124/84  Pulse: 84 84  Resp: 18 19  Temp:    SpO2: 98% 98%    Last Pain:  Vitals:   03/23/18 1746  TempSrc:   PainSc: 3                  Jowel Waltner

## 2018-03-23 NOTE — Progress Notes (Signed)
Patient ID: Pam Johnson, female   DOB: November 26, 1950, 67 y.o.   MRN: [id number] INR . Post procedure day 1. Clinically sig better being more alert and appropriately responsive. Denies any H/S,N/V visual or speech difficulties. Says strength sig improved.  O/E AAOx 3  Speech  And comp WNL. Pupils 3mm rt = LT. NO appreiable facial droop. Tongue midline. Motor able to lift both arms above her head. Lt UE motor 4+/5. Both groin soft. No hematoma. Pulses palpable bilaterally.  Plan.  Early ambulation and incresed po intake. Awaiting PT.OT. Have discussed with patient the 75 % stenosis of the Lt ICA prox and  close follow up with option of Lt ICA stent assisted angioplasty to augment flow to  Both cerebral  hemisperes,especially the right. Will follow. S.Mikhala Kenan MD

## 2018-03-23 NOTE — Progress Notes (Signed)
Overnight on-call cross coverage note  Called twice by patient's RN regarding discrepancy in the blood pressure measurements between the cuff and the A-line. Patient's orders by endovascular to keep systolic 1 20-1 40. Patient was not revascularized, has an acute ischemic stroke with a right carotid occlusion, I would recommend keeping the blood pressure is higher-goal range 120-160 at least and allowing permissive hypertension for the first 24 to 48 hours. I ordered a bolus normal saline 500 cc x 1 that improved her blood pressures-cuff readings 150s and A-line reading 100s. I have ordered another bolus of 500 cc. Currently, neuro exam unchanged per nursing staff. I do not see a need to start pressors. We will continue to monitor. Stroke team to follow in the morning.  -- Milon Dikes, MD [facility] Pager: [phone] If 7pm to 7am, please call on call as listed on AMION.

## 2018-03-23 NOTE — Evaluation (Signed)
Clinical/Bedside Swallow Evaluation Patient Details  Name: Pam Johnson MRN: [id number] Date of Birth: Oct 23, 1951  Today's Date: 03/23/2018 Time: SLP Start Time (ACUTE ONLY): 0935 SLP Stop Time (ACUTE ONLY): 0945 SLP Time Calculation (min) (ACUTE ONLY): 10 min  Past Medical History:  Past Medical History:  Diagnosis Date  . Acid reflux   . COPD (chronic obstructive pulmonary disease) (HCC)   . Hypertension   . Sacral fracture (HCC)   . Spinal stenosis    Past Surgical History:  Past Surgical History:  Procedure Laterality Date  . ABDOMINAL HYSTERECTOMY    . CHOLECYSTECTOMY    . HERNIA REPAIR    . NISSEN FUNDOPLICATION     HPI:  67 y.o. female stenting as code stroke with left-sided deficits including left arm flaccidity, left leg weakness, left facial droop, left-sided neglect.  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  Patient was brought to interventional radiology. briefly intubated for procedure. MRI is pending. Pt reports a history of globus, GERD and smoking. She always wears her dentures to eat, but also has ulcers on her gums and has to put a special substance on her denture to treat this and does not currently have it.    Assessment / Plan / Recommendation Clinical Impression  Brief assessment; pt did not want to attempt regular solids due to need for medication on dentures that was not available. Pt did not exhibit any neuromuscular impairment that impacted oral function. No signs of aspiration observed with puree or thin liquids. Pt does endorse globus with solids, she says her "food gets stuck pretty often."  Will initiate a dys 2 diet as pt does not want puree, with thin liquids and f/u for tolerance given potential for esophageal complication with inability to fully masticate without dentures.  SLP Visit Diagnosis: Dysphagia, unspecified (R13.10)    Aspiration Risk  Mild aspiration  risk    Diet Recommendation Dysphagia 2 (Fine chop);Thin liquid   Liquid Administration via: Cup;Straw Medication Administration: Whole meds with liquid Supervision: Patient able to self feed Compensations: Slow rate;Small sips/bites Postural Changes: Seated upright at 90 degrees    Other  Recommendations Oral Care Recommendations: Oral care BID   Follow up Recommendations        Frequency and Duration min 2x/week  1 week       Prognosis Prognosis for Safe Diet Advancement: Good      Swallow Study   General HPI: 67 y.o. female stenting as code stroke with left-sided deficits including left arm flaccidity, left leg weakness, left facial droop, left-sided neglect.  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  Patient was brought to interventional radiology. briefly intubated for procedure. MRI is pending. Pt reports a history of globus, GERD and smoking. She always wears her dentures to eat, but also has ulcers on her gums and has to put a special substance on her denture to treat this and does not currently have it.  Type of Study: Bedside Swallow Evaluation Previous Swallow Assessment: none Diet Prior to this Study: NPO Temperature Spikes Noted: No Respiratory Status: Nasal cannula History of Recent Intubation: Yes Length of Intubations (days): (less than 24 hours) Date extubated: 03/22/18 Behavior/Cognition: Alert;Cooperative;Pleasant mood Oral Cavity Assessment: Within Functional Limits Oral Care Completed by SLP: No Oral Cavity - Dentition: Edentulous(dentures avialable, but not in plce ) Self-Feeding Abilities: Able  to feed self Patient Positioning: Upright in bed Baseline Vocal Quality: Hoarse(smokers voice) Volitional Cough: Strong(smokers cough) Volitional Swallow: Able to elicit    Oral/Motor/Sensory Function Overall Oral Motor/Sensory Function: Within functional limits   Ice Chips      Thin Liquid Thin Liquid: Within functional limits Presentation: Cup;Straw;Self Fed    Nectar Thick Nectar Thick Liquid: Not tested   Honey Thick Honey Thick Liquid: Not tested   Puree Puree: Within functional limits   Solid   GO   Solid: Not tested Other Comments: pt did not want to wear dentures       Harlon Ditty, MA CCC-SLP [phone]  Yassin Scales, Riley Nearing 03/23/2018,9:51 AM

## 2018-03-23 NOTE — Evaluation (Signed)
Physical Therapy Evaluation Patient Details Name: Pam Johnson MRN: [id number] DOB: [birth year] Today's Date: 03/23/2018   History of Present Illness  Pam Johnson is an 67 y.o. female with history of spinal stenosis, hypotension.  CT revealed a right occipital infarct. CTA was obtained which did show a left occluded internal carotid artery.  While in CT scanner patient was showing hypotensive with blood pressure with systolic reading in the 88  Clinical Impression  Patient presents with decreased independence and safety with mobility due to weakness and decreased awareness of L side, decreased balance, decreased awareness of deficits and decreased safety awareness.  She will benefit from skilled PT in the acute setting to allow return home with spouse assist following CIR level rehab stay.    Follow Up Recommendations Supervision/Assistance - 24 hour;CIR    Equipment Recommendations  None recommended by PT    Recommendations for Other Services Rehab consult     Precautions / Restrictions Precautions Precautions: Fall Restrictions Weight Bearing Restrictions: No      Mobility  Bed Mobility               General bed mobility comments: pt up in recliner  Transfers Overall transfer level: Needs assistance Equipment used: Rolling walker (2 wheeled) Transfers: Sit to/from Stand Sit to Stand: Mod assist         General transfer comment: assist for lines, balance, safety, lifting  Ambulation/Gait Ambulation/Gait assistance: Min assist Ambulation Distance (Feet): 10 Feet Assistive device: Rolling walker (2 wheeled) Gait Pattern/deviations: Step-to pattern;Decreased stride length;Trunk flexed     General Gait Details: off balance and with decreased L side awareness not using walker on her L side without cues and assist decreased safety awareness  Stairs            Wheelchair Mobility    Modified Rankin (Stroke Patients Only) Modified Rankin (Stroke  Patients Only) Pre-Morbid Rankin Score: Moderate disability Modified Rankin: Moderately severe disability     Balance Overall balance assessment: Needs assistance   Sitting balance-Leahy Scale: Fair       Standing balance-Leahy Scale: Poor Standing balance comment: support needed for balance                             Pertinent Vitals/Pain Pain Assessment: 0-10 Pain Score: 8  Pain Location: L wrist, neck and headache Pain Descriptors / Indicators: Constant;Discomfort;Throbbing;Sharp Pain Intervention(s): Monitored during session;Repositioned;Patient requesting pain meds-RN notified;Other (comment)(distraction with functional activities)    Home Living Family/patient expects to be discharged to:: Private residence Living Arrangements: Spouse/significant other Available Help at Discharge: Family;Available PRN/intermittently Type of Home: House Home Access: Stairs to enter Entrance Stairs-Rails: None Entrance Stairs-Number of Steps: 1 Home Layout: Two level;Able to live on main level with bedroom/bathroom Home Equipment: Walker - 4 wheels;Bedside commode;Shower seat;Cane - single point      Prior Function Level of Independence: Needs assistance   Gait / Transfers Assistance Needed: SPC  ADL's / Homemaking Assistance Needed: spouse makes meals but she gets her own lunch; Pt is supervision for bathing in tub        Hand Dominance   Dominant Hand: Left    Extremity/Trunk Assessment   Upper Extremity Assessment Upper Extremity Assessment: Defer to OT evaluation RUE Deficits / Details: (P) in wrist splint for arthritis, bruising from fall "about 2 weeks ago" generalized weakness, full ROM, decreased coordination and dropping of ADL objects RUE: (P) Unable to fully assess due  to immobilization RUE Sensation: (P) WNL RUE Coordination: (P) decreased fine motor LUE Deficits / Details: (P) strength grossly 3+/5, able to move LUE in all planes of motion.  movement is not smooth, and requires cues to use LUE during functional tasks LUE Coordination: (P) decreased fine motor    Lower Extremity Assessment Lower Extremity Assessment: Generalized weakness    Cervical / Trunk Assessment Cervical / Trunk Assessment: Kyphotic  Communication   Communication: No difficulties  Cognition Arousal/Alertness: Awake/alert Behavior During Therapy: Impulsive Overall Cognitive Status: Impaired/Different from baseline Area of Impairment: Attention;Following commands;Safety/judgement;Awareness;Problem solving                   Current Attention Level: Selective   Following Commands: Follows one step commands consistently Safety/Judgement: Decreased awareness of safety;Decreased awareness of deficits Awareness: Emergent Problem Solving: Requires verbal cues;Requires tactile cues;Difficulty sequencing General Comments: (P) Pt with decreased safety awareness, very impulsive and quick moving then gets very frustrated when asked to slow down. Then apologizes for being rude.       General Comments General comments (skin integrity, edema, etc.): bruise on her forehead on L side, lots of LE markings (?petechiae vs bruises)    Exercises     Assessment/Plan    PT Assessment Patient needs continued PT services  PT Problem List Decreased balance;Decreased knowledge of use of DME;Decreased safety awareness;Impaired sensation;Decreased activity tolerance;Decreased cognition;Decreased mobility;Decreased strength;Decreased knowledge of precautions       PT Treatment Interventions DME instruction;Functional mobility training;Balance training;Patient/family education;Gait training;Therapeutic activities;Neuromuscular re-education;Stair training;Therapeutic exercise    PT Goals (Current goals can be found in the Care Plan section)  Acute Rehab PT Goals Patient Stated Goal: none stated PT Goal Formulation: Patient unable to participate in goal  setting Time For Goal Achievement: 04/06/18 Potential to Achieve Goals: Good    Frequency Min 4X/week   Barriers to discharge        Co-evaluation               AM-PAC PT "6 Clicks" Daily Activity  Outcome Measure Difficulty turning over in bed (including adjusting bedclothes, sheets and blankets)?: A Lot Difficulty moving from lying on back to sitting on the side of the bed? : Unable Difficulty sitting down on and standing up from a chair with arms (e.g., wheelchair, bedside commode, etc,.)?: Unable Help needed moving to and from a bed to chair (including a wheelchair)?: A Little Help needed walking in hospital room?: A Little Help needed climbing 3-5 steps with a railing? : Total 6 Click Score: 11    End of Session Equipment Utilized During Treatment: Gait belt Activity Tolerance: Other (comment)(limited due to going for CT scan) Patient left: Other (comment)(in w/c for transport)   PT Visit Diagnosis: Other abnormalities of gait and mobility (R26.89);Apraxia (R48.2);Other symptoms and signs involving the nervous system (R29.898)    Time: 1210-1230 PT Time Calculation (min) (ACUTE ONLY): 20 min   Charges:   PT Evaluation $PT Eval Moderate Complexity: 1 Mod     PT G CodesSheran Lawless, [place] [phone] 03/23/2018   Elray Mcgregor 03/23/2018, 4:57 PM

## 2018-03-23 NOTE — Progress Notes (Addendum)
STROKE TEAM PROGRESS NOTE  HPI ( Dr Amada Jupiter )Pam Johnson is an 67 y.o. female with history of spinal stenosis, hypotension.  Per med rec patient is not on any antiplatelet.  She is neither on any anticoagulant.  Per husband patient was fine at 1205.  He left the room, upon entering the room he noted that she was flaccid on the left in addition she had a left facial droop.  He is also noted over the past week or 2 that she has been falling to the left and she has also been bumping into objects on the left.  Immediate CT was obtained secondary to code stroke which revealed a right occipital infarct that was well-demarcated and likely subacute within last couple weeks.  CTA was obtained which did show a left occluded internal carotid artery.  While in CT scanner patient was showing hypotensive with blood pressure with systolic reading in the 88.  Abdominal ultrasound was obtained however due to difficulty with gas in the belly it was hard to obtain a good study to look for an abdominal aortic aneurysm.  Due to patient having a significant neglect on the left patient was unable to do some of the exam.  At this time, patient will be brought to interventional radiology for further evaluation  Date last known well: Date: 03/22/2018 Time last known well: Time: 12:05 tPA Given: No: CVA within the last 3 months NIHSS 13 Modified Rankin: Rankin Score=0    INTERVAL HISTORY Her RN is at the bedside. I have reviewed in details patient's history of presenting illness with her. She feels like she is improving, much better than yesterday.  Plans are for MRI.  She reports anxiety with MRI as well as claustrophobia.  Will order Haldol on call.  Complete stroke work-up and discharge based on therapy needs.  Vitals:   03/23/18 0745 03/23/18 0800 03/23/18 0801 03/23/18 1100  BP: (!) 173/85 (!) 175/115  (!) 166/86  Pulse: 85 85  87  Resp: 20 (!) 24  (!) 21  Temp: 98.5 F (36.9 C)     TempSrc: Oral      SpO2: 96% 99% 98%   Weight:      Height:        CBC:  Recent Labs  Lab 03/22/18 1317 03/23/18 0425  WBC 7.4 5.5  NEUTROABS 5.0 3.7  HGB 12.8 10.3*  HCT 39.1 31.6*  MCV 90.9 92.1  PLT 460* 383    Basic Metabolic Panel:  Recent Labs  Lab 03/22/18 1317 03/23/18 0420  NA 137 141  K 3.9 3.7  CL 100* 110  CO2 26 22  GLUCOSE 115* 97  BUN 8 <5*  CREATININE 0.99 0.48  CALCIUM 8.8* 7.7*   Lipid Panel:     Component Value Date/Time   CHOL 132 03/23/2018 0420   TRIG 124 03/23/2018 0420   HDL 63 03/23/2018 0420   CHOLHDL 2.1 03/23/2018 0420   VLDL 25 03/23/2018 0420   LDLCALC 44 03/23/2018 0420   HgbA1c:  Lab Results  Component Value Date   HGBA1C 5.6 03/23/2018   Urine Drug Screen:     Component Value Date/Time   LABOPIA POSITIVE (A) 03/22/2018 1402   COCAINSCRNUR NONE DETECTED 03/22/2018 1402   LABBENZ NONE DETECTED 03/22/2018 1402   AMPHETMU NONE DETECTED 03/22/2018 1402   THCU POSITIVE (A) 03/22/2018 1402   LABBARB NONE DETECTED 03/22/2018 1402    Alcohol Level     Component Value Date/Time   ETH <10  03/22/2018 1317    IMAGING Ct Angio Head W Or Wo Contrast  Result Date: 03/22/2018 CLINICAL DATA:  Right MCA syndrome EXAM: CT ANGIOGRAPHY HEAD AND NECK CT PERFUSION BRAIN TECHNIQUE: Multidetector CT imaging of the head and neck was performed using the standard protocol during bolus administration of intravenous contrast. Multiplanar CT image reconstructions and MIPs were obtained to evaluate the vascular anatomy. Carotid stenosis measurements (when applicable) are obtained utilizing NASCET criteria, using the distal internal carotid diameter as the denominator. Multiphase CT imaging of the brain was performed following IV bolus contrast injection. Subsequent parametric perfusion maps were calculated using RAPID software. CONTRAST:  Dose not currently available COMPARISON:  None. FINDINGS: CTA NECK FINDINGS Aortic arch: Extensive atherosclerotic plaque.  No  dissection. Right carotid system: Bulky, irregular calcified plaque at the brachiocephalic origin. Prominent noncalcified plaque primarily on the anterior wall the common carotid with up to 50% stenosis. Bulky plaque at the bifurcation of the common carotid with ICA occlusion. The non-opacified vessel is difficult to distinguish and may be collapsed from chronic occlusion. Left carotid system: Diffuse atherosclerotic plaque. Proximal common carotid stenosis of 50% on coronal reformats. Bulky calcified plaque at the ICA bulb causes ~80% stenosis. Negative for ulceration. Vertebral arteries: Extensive calcified plaque along the proximal left subclavian with nonvisualized lumen in places due to degree of stenosis and calcified plaque blooming. Beyond the vertebral origin there is additional bulky calcified plaque with at least 50% stenosis before and at the rib crossing (quantification is limited given the extent of disease which makes it difficult to determine baseline diameter). The left vertebral artery is occluded at the V2 segment. There is severe proximal right vertebral stenosis due to calcified plaque, with non visible lumen. Right proximal subclavian stenosis measures up to 50%. Skeleton: No acute finding. Severe cervical spine disc and facet degeneration with multilevel listhesis and kyphotic deformity. Other neck: No acute finding. Upper chest: Emphysema Review of the MIP images confirms the above findings CTA HEAD FINDINGS Anterior circulation: Right ICA occlusion to the level of the terminus where there is reconstitution presumably via the small P1 segment and a tiny anterior communicating artery. Right MCA branches are under filled but symmetrically opacified. Symmetric bilateral ACA flow. Atherosclerotic plaque on the left carotid siphon with mild-to-moderate narrowing. No left-sided branch occlusion is seen. Posterior circulation: Robust flow in the right vertebral artery. Presumed retrograde flow in  the left vertebral artery. Both picas are enhancing. There is moderate narrowing and calcified plaque at the left V4 segment. Hypoplastic right P1 segment. Under filled right PCA branches. Venous sinuses: Not yet opacified Anatomic variants: Fetal type right PCA Delayed phase: Not obtained in the emergent setting Review of the MIP images confirms the above findings CT Brain Perfusion Findings: CBF (<30%) Volume: 33mL Perfusion (Tmax>6.0s) volume: Mismatch Volume: Infarction Location:The infarct by CT perfusion encompasses the low-density completed infarct by noncontrast CT. It also includes areas along the upper watershed that were not clearly seen by CT. The penumbra calculation includes ACA territory. At time of head CT call report, Dr Amada Jupiter was already aware of RICA occlusion. Communication of perfusion findings is underway. 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 Limited major collateral pathways-fetal type right PCA with hypoplastic right P1 segment and probable tiny anterior communicating artery. 33 cc infarct in the right MCA/PCA distribution, which includes the area of completed infarct by noncontrast CT and additional parenchyma along the watershed. Nearly the entire  right cerebral hemisphere is in the penumbra range (336 cc). Severe underlying atherosclerotic disease: *Severe proximal left subclavian stenosis. 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 *Severe right vertebral artery origin stenosis. *~80% proximal left ICA stenosis. Nearly 50% proximal left common carotid stenosis. *~50% stenosis at the right common carotid origin. Emphysema. Electronically Signed   By: Marnee Spring M.D.   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 Cough and  shortness of breath. Aspiration. EXAM: CHEST - 2 VIEW COMPARISON:  01/27/2016 chest radiographs and earlier. FINDINGS: Upright AP and lateral views of the chest at 1054 hours. Stable large lung volumes. No pneumothorax, pulmonary edema or consolidation. Small bilateral pleural effusions suspected. Chronic hiatal hernia suspected. Other mediastinal contours are within normal limits. Calcified aortic atherosclerosis. Visualized tracheal air column is within normal limits. Chronic lower thoracic augmented compression fracture. Partially visible right proximal humerus ORIF hardware. Stable visualized osseous structures. Negative visible bowel gas pattern. IMPRESSION: 1. Probable small bilateral pleural effusions. 2. No other acute cardiopulmonary abnormality identified. 3. Chronic hiatal hernia suspected. 4.  Aortic Atherosclerosis (ICD10-I70.0). Electronically Signed   By: Odessa Fleming M.D.   On: 03/23/2018 10:52   Ct Angio Neck W Or Wo Contrast  Result Date: 03/22/2018 CLINICAL DATA:  Right MCA syndrome EXAM: CT ANGIOGRAPHY HEAD AND NECK CT PERFUSION BRAIN TECHNIQUE: Multidetector CT imaging of the head and neck was performed using the standard protocol during bolus administration of intravenous contrast. Multiplanar CT image reconstructions and MIPs were obtained to evaluate the vascular anatomy. Carotid stenosis measurements (when applicable) are obtained utilizing NASCET criteria, using the distal internal carotid diameter as the denominator. Multiphase CT imaging of the brain was performed following IV bolus contrast injection. Subsequent parametric perfusion maps were calculated using RAPID software. CONTRAST:  Dose not currently available COMPARISON:  None. FINDINGS: CTA NECK FINDINGS Aortic arch: Extensive atherosclerotic plaque.  No dissection. Right carotid system: Bulky, irregular calcified plaque at the brachiocephalic origin. Prominent noncalcified plaque primarily on the anterior wall the common carotid  with up to 50% stenosis. Bulky plaque at the bifurcation of the common carotid with ICA occlusion. The non-opacified vessel is difficult to distinguish and may be collapsed from chronic occlusion. Left carotid system: Diffuse atherosclerotic plaque. Proximal common carotid stenosis of 50% on coronal reformats. Bulky calcified plaque at the ICA bulb causes ~80% stenosis. Negative for ulceration. Vertebral arteries: Extensive calcified plaque along the proximal left subclavian with nonvisualized lumen in places due to degree of stenosis and calcified plaque blooming. Beyond the vertebral origin there is additional bulky calcified plaque with at least 50% stenosis before and at the rib crossing (quantification is limited given the extent of disease which makes it difficult to determine baseline diameter). The left vertebral artery is occluded at the V2 segment. There is severe proximal right vertebral stenosis due to calcified plaque, with non visible lumen. Right proximal subclavian stenosis measures up to 50%. Skeleton: No acute finding. Severe cervical spine disc and facet degeneration with multilevel listhesis and kyphotic deformity. Other neck: No acute finding. Upper chest: Emphysema Review of the MIP images confirms the above findings CTA HEAD FINDINGS Anterior circulation: Right ICA occlusion to the level of the terminus where there is reconstitution presumably via the small P1 segment and a tiny anterior communicating artery. Right MCA branches are under filled but symmetrically opacified. Symmetric bilateral ACA  flow. Atherosclerotic plaque on the left carotid siphon with mild-to-moderate narrowing. No left-sided branch occlusion is seen. Posterior circulation: Robust flow in the right vertebral artery. Presumed retrograde flow in the left vertebral artery. Both picas are enhancing. There is moderate narrowing and calcified plaque at the left V4 segment. Hypoplastic right P1 segment. Under filled right PCA  branches. Venous sinuses: Not yet opacified Anatomic variants: Fetal type right PCA Delayed phase: Not obtained in the emergent setting Review of the MIP images confirms the above findings CT Brain Perfusion Findings: CBF (<30%) Volume: 33mL Perfusion (Tmax>6.0s) volume: Mismatch Volume: Infarction Location:The infarct by CT perfusion encompasses the low-density completed infarct by noncontrast CT. It also includes areas along the upper watershed that were not clearly seen by CT. The penumbra calculation includes ACA territory. At time of head CT call report, Dr Amada Jupiter was already aware of RICA occlusion. Communication of perfusion findings is underway. 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 Limited major collateral pathways-fetal type right PCA with hypoplastic right P1 segment and probable tiny anterior communicating artery. 33 cc infarct in the right MCA/PCA distribution, which includes the area of completed infarct by noncontrast CT and additional parenchyma along the watershed. Nearly the entire right cerebral hemisphere is in the penumbra range (336 cc). Severe underlying atherosclerotic disease: *Severe proximal left subclavian stenosis. 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 *Severe right vertebral artery origin stenosis. *~80% proximal left ICA stenosis. Nearly 50% proximal left common carotid stenosis. *~50% stenosis at the right common carotid origin. Emphysema. Electronically Signed   By: Marnee Spring M.D.   On: 03/22/2018 13:45   Ct Cerebral Perfusion W Contrast  Result Date: 03/22/2018 CLINICAL DATA:  Right MCA syndrome EXAM: CT ANGIOGRAPHY HEAD AND NECK CT PERFUSION BRAIN TECHNIQUE: Multidetector CT imaging of the head and neck was performed using the standard protocol during bolus administration of intravenous contrast. Multiplanar CT image reconstructions and  MIPs were obtained to evaluate the vascular anatomy. Carotid stenosis measurements (when applicable) are obtained utilizing NASCET criteria, using the distal internal carotid diameter as the denominator. Multiphase CT imaging of the brain was performed following IV bolus contrast injection. Subsequent parametric perfusion maps were calculated using RAPID software. CONTRAST:  Dose not currently available COMPARISON:  None. FINDINGS: CTA NECK FINDINGS Aortic arch: Extensive atherosclerotic plaque.  No dissection. Right carotid system: Bulky, irregular calcified plaque at the brachiocephalic origin. Prominent noncalcified plaque primarily on the anterior wall the common carotid with up to 50% stenosis. Bulky plaque at the bifurcation of the common carotid with ICA occlusion. The non-opacified vessel is difficult to distinguish and may be collapsed from chronic occlusion. Left carotid system: Diffuse atherosclerotic plaque. Proximal common carotid stenosis of 50% on coronal reformats. Bulky calcified plaque at the ICA bulb causes ~80% stenosis. Negative for ulceration. Vertebral arteries: Extensive calcified plaque along the proximal left subclavian with nonvisualized lumen in places due to degree of stenosis and calcified plaque blooming. Beyond the vertebral origin there is additional bulky calcified plaque with at least 50% stenosis before and at the rib crossing (quantification is limited given the extent of disease which makes it difficult to determine baseline diameter). The left vertebral artery is occluded at the V2 segment. There is severe proximal right vertebral stenosis due to calcified plaque, with non visible lumen. Right proximal subclavian stenosis measures up to 50%. Skeleton: No acute finding. Severe cervical spine disc and facet degeneration  with multilevel listhesis and kyphotic deformity. Other neck: No acute finding. Upper chest: Emphysema Review of the MIP images confirms the above findings CTA  HEAD FINDINGS Anterior circulation: Right ICA occlusion to the level of the terminus where there is reconstitution presumably via the small P1 segment and a tiny anterior communicating artery. Right MCA branches are under filled but symmetrically opacified. Symmetric bilateral ACA flow. Atherosclerotic plaque on the left carotid siphon with mild-to-moderate narrowing. No left-sided branch occlusion is seen. Posterior circulation: Robust flow in the right vertebral artery. Presumed retrograde flow in the left vertebral artery. Both picas are enhancing. There is moderate narrowing and calcified plaque at the left V4 segment. Hypoplastic right P1 segment. Under filled right PCA branches. Venous sinuses: Not yet opacified Anatomic variants: Fetal type right PCA Delayed phase: Not obtained in the emergent setting Review of the MIP images confirms the above findings CT Brain Perfusion Findings: CBF (<30%) Volume: 33mL Perfusion (Tmax>6.0s) volume: Mismatch Volume: Infarction Location:The infarct by CT perfusion encompasses the low-density completed infarct by noncontrast CT. It also includes areas along the upper watershed that were not clearly seen by CT. The penumbra calculation includes ACA territory. At time of head CT call report, Dr Amada Jupiter was already aware of RICA occlusion. Communication of perfusion findings is underway. 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 Limited major collateral pathways-fetal type right PCA with hypoplastic right P1 segment and probable tiny anterior communicating artery. 33 cc infarct in the right MCA/PCA distribution, which includes the area of completed infarct by noncontrast CT and additional parenchyma along the watershed. Nearly the entire right cerebral hemisphere is in the penumbra range (336 cc). Severe underlying atherosclerotic disease: *Severe proximal left subclavian stenosis.  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 *Severe right vertebral artery origin stenosis. *~80% proximal left ICA stenosis. Nearly 50% proximal left common carotid stenosis. *~50% stenosis at the right common carotid origin. Emphysema. Electronically Signed   By: Marnee Spring M.D.   On: 03/22/2018 13:45   Ct Head Code Stroke Wo Contrast  Result Date: 03/22/2018 CLINICAL DATA:  Code stroke. Left-sided weakness. Last seen normal 12 10 EXAM: CT HEAD WITHOUT CONTRAST TECHNIQUE: Contiguous axial images were obtained from the base of the skull through the vertex without intravenous contrast. COMPARISON:  08/30/2006 FINDINGS: Brain: Moderate area of cytotoxic edema in the right occipital lobe. More well-defined cortical and subcortical low-density in the high anterior right frontal lobe, also affecting a moderate area, likely pre-existing infarct. There is mild presumed small vessel ischemic change in the cerebral white matter. No hemorrhage. No hydrocephalus. Vascular: Atherosclerotic calcification.  No high-density vessel. Skull: Negative Sinuses/Orbits: Negative Other: These results were called by telephone at the time of interpretation on 03/22/2018 at 1:13 pm to Dr. Leonarda Salon, who was already aware. ASPECTS [facility] Stroke Program Early CT Score) - Ganglionic level infarction (caudate, lentiform nuclei, internal capsule, insula, M1-M3 cortex): 7 - Supraganglionic infarction (M4-M6 cortex): 1-2 Total score (0-10 with 10 being normal): 8-9 IMPRESSION: 1. 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 2. Favored remote anterior right frontal cortex and white matter infarct. 3. Negative for hemorrhage. Electronically Signed   By: Marnee Spring M.D.   On: 03/22/2018 13:17   2D Echocardiogram  pending   PHYSICAL EXAM   HEENT-  Normocephalic, no lesions, without obvious abnormality.  Normal external eye and conjunctiva.   Lungs-no rhonchi or wheezing noted, no  excessive working breathing.  Saturations within normal limits Extremities- Warm, dry and intact Musculoskeletal-no joint tenderness, deformity or swelling Skin-warm and dry, no hyperpigmentation, vitiligo, or suspicious lesions  Neurological Examination Mental Status: Alert and oriented x 3.  Speech fluent without evidence of aphasia.  Able to follow commands without difficulty. Cranial Nerves: II: Left hemianopsia III,IV, VI: ptosis not present, EOMI. Pupils are equal round reactive to light.  They are approximately 2 mm.   V,VII: face symmetric VIII: hearing normal bilaterally IX,X: uvula rises symmetrically XI: bilateral shoulder shrug XII: midline tongue extension Motor: Right :  Upper extremity   5/5                                      Left:     Upper extremity   0/5             Lower extremity   5/5                                                  Lower extremity   2/5 Tone and bulk:normal tone throughout; no atrophy noted Sensory: Sensation is intact  Plantars: Right: downgoing                                Left: downgoing Cerebellar: No Dysmetria noted  Gait: Not tested   ASSESSMENT/PLAN [name] is a 67 y.o. female with history of spinal stenosis and hypotention presenting with L sided hemiparesis.   Stroke:  Probably R brain infarct secondary to failed collaterals from  R ICA occlusion -attempted unsuccessful revascularization  Code Stroke CT head moderate right occipital lobe infarct.  Old right frontal cortex and white matter infarct.    CTA head & neck R ICA occlusion beginning at the R common carotid bifurcation with reconstitution at the terminus.  Limited collateral pathways with fetal type R PCA and hypoplastic R P1 and tiny ACom.  Severe proximal L subclavian stenosis.  Occlusion nondominant LV to with intracranial reconstitution.  Severe R VA origin stenosis.  L ICA 80% proximally.  L common carotid 50%.  R common carotid origin 50%.  CT  perfusion 33cc infarct R MCA/PCA and watershed area. Nearly R cerebral hemisphere in the penumbra range (336cc)  MRI could not tolerate  Cerebral angio L ICA stenosis, no intervention now, consider stent. Dr. Corliss Skains following  Repeat CT head 5/30 read as mild evolution of right cytotoxic edema with core infarct doubling in size.  Questionable new white matter ischemia left hemisphere and interval development of left MCA branch thromboembolism.  Findings not supported by patient exam.  Dr. Pearlean Brownie discussed with radiologist.  2D Echo  pending   LDL 44  HgbA1c 5.6  Heparin 5000 units sq tid for VTE prophylaxis Diet Order           DIET DYS 2 Room service appropriate? Yes; Fluid consistency: Thin  Diet effective now           No antithrombotic prior to admission, now on aspirin 81 mg daily and clopidogrel 75 mg daily x 3 months then aspirin alone  Therapy recommendations:  pending   Disposition:  pending   Carotid stenosis  L  ICA stenosis  Dr. Corliss Skains following for possible stent  Hypertension  Variable BP 84/59 - 186/135 . Permissive hypertension (OK if < 220/120) but gradually normalize in 5-7 days . Long-term BP goal normotensive  Other Stroke Risk Factors  Advanced age  Cigarette smoker, advised to stop smoking  ETOH use, advised to drink no more than 1 drink(s) a day  Other Active Problems  Chronic hip and back pain  Hospital day # 1  Annie Main, MSN, APRN, ANVP-BC, AGPCNP-BC Advanced Practice Stroke Nurse [facility] See Amion for Schedule & Pager information 03/23/2018 2:57 PM  I have personally examined this patient, reviewed notes, independently viewed imaging studies, participated in medical decision making and plan of care.ROS completed by me personally and pertinent positives fully documented  I have made any additions or clarifications directly to the above note. Agree with note above.  She presented with subacute history of  left-sided vision disturbance likely due to right parieto-occipital infarct with left hemiparesis yesterday prompting admission. She had significant perfusion deficit with right ICA occlusion but could not be revascularized but clinically surprisingly seems to be doing quite well.Recommend dual antiplatelet therapy for 3 months and avoid hypotension and keep mean arterial pressure greater than 80. Continue ongoing stroke workup. Patient counseled to quit smoking. No family available at the bedside for discussion.This patient is critically ill and at significant risk of neurological worsening, death and care requires constant monitoring of vital signs, hemodynamics,respiratory and cardiac monitoring, extensive review of multiple databases, frequent neurological assessment, discussion with family, other specialists and medical decision making of high complexity.I have made any additions or clarifications directly to the above note.This critical care time does not reflect procedure time, or teaching time or supervisory time of PA/NP/Med Resident etc but could involve care discussion time.  I spent 30 minutes of neurocritical care time  in the care of  this patient.       Delia Heady, MD Medical Director [facility] Stroke Center Pager: [phone] 03/23/2018 5:12 PM  To contact Stroke Continuity provider, please refer to [email or web address]. After hours, contact General Neurology

## 2018-03-23 NOTE — Progress Notes (Signed)
OT Cancellation Note  Patient Details Name: Pam Johnson MRN: [id number] DOB: 1951-05-29   Cancelled Treatment:    Reason Eval/Treat Not Completed: Patient at procedure or test/ unavailable(with SLP, and then taking Pt for MRI). OT will continue to follow for evaluation.  Evern Bio [name] 03/23/2018, 9:34 AM  Sherryl Manges OTR/L [phone]

## 2018-03-23 NOTE — Evaluation (Signed)
Occupational Therapy Evaluation Patient Details Name: Pam Johnson MRN: [id number] DOB: 1951/10/21 Today's Date: 03/23/2018    History of Present Illness Pam Johnson is an 67 y.o. female with history of spinal stenosis, hypotension.  CT revealed a right occipital infarct. CTA was obtained which did show a left occluded internal carotid artery.  While in CT scanner patient was showing hypotensive with blood pressure with systolic reading in the 88   Clinical Impression   PTA Pt mod I for ADL (supervision for bathing) and mod I with SPC and furniture walking. Pt is currently overall min A for ADL and mod A for sit to stand transfers. Pt with 2 episodes of tachycardia during session which her HR maxed out at 160, quickly returned to normal level with seated position. BP high but within permission HTN set by MD. Pt very Impulsive throughout session, and many cognitive deficits with decreased safety awareness. Pt with left inattention and visual deficits on the left. LUE with decreased fine motor and weakness (dominant hand) and today Pt was unable to write name (holds pen appropriately but scribbles), unable to draw clock at all. Just draws circle on circle on circle. Verbalized frustration with inability to do certain tasks or needing help, but with no safety awareness. OT will continue to follow in the acute setting, and Pt will require CIR level therapy to maximize safety and independence as well as caregiver education.      Follow Up Recommendations  CIR;Supervision/Assistance - 24 hour    Equipment Recommendations  None recommended by OT(defer to next venue)    Recommendations for Other Services Rehab consult     Precautions / Restrictions Precautions Precautions: Fall Restrictions Weight Bearing Restrictions: No      Mobility Bed Mobility Overal bed mobility: Needs Assistance Bed Mobility: Supine to Sit;Sit to Supine     Supine to sit: Supervision Sit to supine:  Supervision   General bed mobility comments: pt up in recliner  Transfers Overall transfer level: Needs assistance Equipment used: Rolling walker (2 wheeled) Transfers: Sit to/from Stand Sit to Stand: Mod assist         General transfer comment: assist for lines, balance, safety, lifting    Balance Overall balance assessment: Needs assistance Sitting-balance support: Feet supported;No upper extremity supported Sitting balance-Leahy Scale: Fair Sitting balance - Comments: able to sit EOB without assist   Standing balance support: Bilateral upper extremity supported;During functional activity Standing balance-Leahy Scale: Poor Standing balance comment: support needed for balance                           ADL either performed or assessed with clinical judgement   ADL Overall ADL's : Needs assistance/impaired Eating/Feeding: Moderate assistance;Sitting Eating/Feeding Details (indicate cue type and reason): trouble holding onto utensils, husband feeding when OT entered "I don't really have an appetite right now" Grooming: Wash/dry hands;Brushing hair;Sitting;Minimal assistance Grooming Details (indicate cue type and reason): due to HR, pt performed seated grooming at the EOB Upper Body Bathing: Minimal assistance;Sitting   Lower Body Bathing: Minimal assistance;Sit to/from stand   Upper Body Dressing : Minimal assistance;Sitting   Lower Body Dressing: Minimal assistance;Sit to/from stand Lower Body Dressing Details (indicate cue type and reason): good flexibility at hips for access to LB, min A for boost and balance while managing clothing Toilet Transfer: Minimal assistance;Ambulation;Comfort height toilet;RW;Grab bars Toilet Transfer Details (indicate cue type and reason): vc for safety and sequencing, min A for  boost and balance Toileting- Clothing Manipulation and Hygiene: Minimal assistance;Sit to/from stand Toileting - Clothing Manipulation Details (indicate  cue type and reason): Pt dropped toilet paper several times, attempted with both hands Tub/ Shower Transfer: Moderate assistance   Functional mobility during ADLs: Minimal assistance;Cueing for safety;Cueing for sequencing;Rolling walker General ADL Comments: very little safety awareness, fine motor coordiation deficits impact her ability to hold onto ADL tools/objects     Vision Baseline Vision/History: Wears glasses Wears Glasses: At all times Patient Visual Report: Eye fatigue/eye pain/headache Vision Assessment?: Yes Eye Alignment: Within Functional Limits Ocular Range of Motion: Impaired-to be further tested in functional context Alignment/Gaze Preference: Gaze right Tracking/Visual Pursuits: Decreased smoothness of eye movement to LEFT superior field;Decreased smoothness of eye movement to LEFT inferior field;Requires cues, head turns, or add eye shifts to track;Impaired - to be further tested in functional context Saccades: Additional head turns occurred during testing;Decreased speed of saccadic movement Convergence: Impaired - to be further tested in functional context Visual Fields: Left homonymous hemianopsia;Left visual field deficit Diplopia Assessment: Other (comment)(denies double vision) Additional Comments: Pt was able to read large print across the room, trouble with reading menu     Perception     Praxis      Pertinent Vitals/Pain Pain Assessment: 0-10 Pain Score: 8  Pain Location: L wrist, neck and headache Pain Descriptors / Indicators: Constant;Discomfort;Throbbing;Sharp Pain Intervention(s): Monitored during session;Repositioned;Patient requesting pain meds-RN notified;Other (comment)(distraction with functional activities)     Hand Dominance Left   Extremity/Trunk Assessment Upper Extremity Assessment Upper Extremity Assessment: Defer to OT evaluation RUE Deficits / Details: in wrist splint for arthritis, bruising from fall "about 2 weeks ago"  generalized weakness, full ROM, decreased coordination and dropping of ADL objects RUE: Unable to fully assess due to immobilization RUE Sensation: WNL RUE Coordination: decreased fine motor LUE Deficits / Details: strength grossly 3+/5, able to move LUE in all planes of motion. movement is not smooth, and requires cues to use LUE during functional tasks LUE Coordination: decreased fine motor   Lower Extremity Assessment Lower Extremity Assessment: Generalized weakness   Cervical / Trunk Assessment Cervical / Trunk Assessment: Kyphotic   Communication Communication Communication: No difficulties   Cognition Arousal/Alertness: Awake/alert Behavior During Therapy: Impulsive Overall Cognitive Status: Impaired/Different from baseline Area of Impairment: Attention;Following commands;Safety/judgement;Awareness;Problem solving                   Current Attention Level: Selective   Following Commands: Follows one step commands consistently Safety/Judgement: Decreased awareness of safety;Decreased awareness of deficits Awareness: Emergent Problem Solving: Requires verbal cues;Requires tactile cues;Difficulty sequencing General Comments: Pt with decreased safety awareness, very impulsive and quick moving then gets very frustrated when asked to slow down. Then apologizes for being rude.    General Comments  bruise on her forehead on L side, lots of LE markings (?petechiae vs bruises)    Exercises     Shoulder Instructions      Home Living Family/patient expects to be discharged to:: Private residence Living Arrangements: Spouse/significant other Available Help at Discharge: Family;Available PRN/intermittently Type of Home: House Home Access: Stairs to enter [employer] of Steps: 1 Entrance Stairs-Rails: None Home Layout: Two level;Able to live on main level with bedroom/bathroom Alternate Level Stairs-Number of Steps: stays on main   Bathroom Shower/Tub:  Tub/shower unit   [employer]: Standard(toilet riser with handles) Bathroom Accessibility: Yes How Accessible: Accessible via wheelchair Home Equipment: Walker - 4 wheels;Bedside commode;Shower seat;Cane - single point  Prior Functioning/Environment Level of Independence: Needs assistance  Gait / Transfers Assistance Needed: SPC ADL's / Homemaking Assistance Needed: spouse makes meals but she gets her own lunch; Pt is supervision for bathing in tub            OT Problem List: Decreased strength;Decreased range of motion;Decreased activity tolerance;Impaired balance (sitting and/or standing);Impaired vision/perception;Decreased coordination;Decreased cognition;Decreased safety awareness;Decreased knowledge of use of DME or AE;Decreased knowledge of precautions;Impaired UE functional use;Pain      OT Treatment/Interventions: Self-care/ADL training;Therapeutic exercise;Neuromuscular education;DME and/or AE instruction;Therapeutic activities;Cognitive remediation/compensation;Visual/perceptual remediation/compensation;Patient/family education;Balance training    OT Goals(Current goals can be found in the care plan section) Acute Rehab OT Goals Patient Stated Goal: none stated OT Goal Formulation: With patient Time For Goal Achievement: 04/06/18 Potential to Achieve Goals: Good  OT Frequency: Min 3X/week   Barriers to D/C:            Co-evaluation              AM-PAC PT "6 Clicks" Daily Activity     Outcome Measure Help from another person eating meals?: A Lot Help from another person taking care of personal grooming?: A Little Help from another person toileting, which includes using toliet, bedpan, or urinal?: A Little Help from another person bathing (including washing, rinsing, drying)?: A Little Help from another person to put on and taking off regular upper body clothing?: A Little Help from another person to put on and taking off regular lower body  clothing?: A Little 6 Click Score: 17   End of Session Equipment Utilized During Treatment: Gait belt;Rolling walker;Oxygen(4L) Nurse Communication: Mobility status;Patient requests pain meds  Activity Tolerance: Patient tolerated treatment well Patient left: in bed;with call bell/phone within reach;with bed alarm set;with family/visitor present  OT Visit Diagnosis: Unsteadiness on feet (R26.81);Other abnormalities of gait and mobility (R26.89);History of falling (Z91.81);Muscle weakness (generalized) (M62.81);Low vision, both eyes (H54.2);Other symptoms and signs involving cognitive function;Other symptoms and signs involving the nervous system (R29.898);Hemiplegia and hemiparesis;Pain Hemiplegia - Right/Left: Left Hemiplegia - dominant/non-dominant: Dominant Hemiplegia - caused by: Cerebral infarction Pain - Right/Left: Right Pain - part of body: Hand                Time: 1355-1435 OT Time Calculation (min): 40 min Charges:  OT General Charges $OT Visit: 1 Visit OT Evaluation $OT Eval Moderate Complexity: 1 Mod OT Treatments $Self Care/Home Management : 23-37 mins G-Codes:     Sherryl Manges OTR/L [phone]  Evern Bio Kirby Cortese 03/23/2018, 5:24 PM

## 2018-03-24 ENCOUNTER — Inpatient Hospital Stay (HOSPITAL_COMMUNITY)
Admission: RE | Admit: 2018-03-24 | Discharge: 2018-04-01 | DRG: 057 | Disposition: A | Discharge status: Home Health | Payer: Medicare Other | Source: Intra-hospital | Attending: Physical Medicine & Rehabilitation | Admitting: Physical Medicine & Rehabilitation

## 2018-03-24 ENCOUNTER — Other Ambulatory Visit: Payer: Self-pay

## 2018-03-24 ENCOUNTER — Inpatient Hospital Stay (HOSPITAL_COMMUNITY): Payer: Medicare Other

## 2018-03-24 ENCOUNTER — Encounter (HOSPITAL_COMMUNITY): Payer: Self-pay | Admitting: Interventional Radiology

## 2018-03-24 DIAGNOSIS — Z885 Allergy status to narcotic agent status: Secondary | ICD-10-CM

## 2018-03-24 DIAGNOSIS — I69354 Hemiplegia and hemiparesis following cerebral infarction affecting left non-dominant side: Principal | ICD-10-CM

## 2018-03-24 DIAGNOSIS — Z79899 Other long term (current) drug therapy: Secondary | ICD-10-CM

## 2018-03-24 DIAGNOSIS — F40240 Claustrophobia: Secondary | ICD-10-CM | POA: Diagnosis present

## 2018-03-24 DIAGNOSIS — Z7951 Long term (current) use of inhaled steroids: Secondary | ICD-10-CM

## 2018-03-24 DIAGNOSIS — R29704 NIHSS score 4: Secondary | ICD-10-CM | POA: Diagnosis present

## 2018-03-24 DIAGNOSIS — I69391 Dysphagia following cerebral infarction: Secondary | ICD-10-CM

## 2018-03-24 DIAGNOSIS — D62 Acute posthemorrhagic anemia: Secondary | ICD-10-CM

## 2018-03-24 DIAGNOSIS — Z8249 Family history of ischemic heart disease and other diseases of the circulatory system: Secondary | ICD-10-CM

## 2018-03-24 DIAGNOSIS — Z79891 Long term (current) use of opiate analgesic: Secondary | ICD-10-CM | POA: Diagnosis not present

## 2018-03-24 DIAGNOSIS — J410 Simple chronic bronchitis: Secondary | ICD-10-CM | POA: Diagnosis not present

## 2018-03-24 DIAGNOSIS — M1610 Unilateral primary osteoarthritis, unspecified hip: Secondary | ICD-10-CM | POA: Diagnosis present

## 2018-03-24 DIAGNOSIS — Z90710 Acquired absence of both cervix and uterus: Secondary | ICD-10-CM

## 2018-03-24 DIAGNOSIS — Z880 Allergy status to penicillin: Secondary | ICD-10-CM | POA: Diagnosis not present

## 2018-03-24 DIAGNOSIS — Z9049 Acquired absence of other specified parts of digestive tract: Secondary | ICD-10-CM

## 2018-03-24 DIAGNOSIS — G4700 Insomnia, unspecified: Secondary | ICD-10-CM | POA: Diagnosis present

## 2018-03-24 DIAGNOSIS — Z888 Allergy status to other drugs, medicaments and biological substances status: Secondary | ICD-10-CM

## 2018-03-24 DIAGNOSIS — K219 Gastro-esophageal reflux disease without esophagitis: Secondary | ICD-10-CM | POA: Diagnosis present

## 2018-03-24 DIAGNOSIS — I69312 Visuospatial deficit and spatial neglect following cerebral infarction: Secondary | ICD-10-CM

## 2018-03-24 DIAGNOSIS — I10 Essential (primary) hypertension: Secondary | ICD-10-CM | POA: Diagnosis present

## 2018-03-24 DIAGNOSIS — G894 Chronic pain syndrome: Secondary | ICD-10-CM

## 2018-03-24 DIAGNOSIS — I6521 Occlusion and stenosis of right carotid artery: Secondary | ICD-10-CM | POA: Diagnosis present

## 2018-03-24 DIAGNOSIS — J449 Chronic obstructive pulmonary disease, unspecified: Secondary | ICD-10-CM

## 2018-03-24 DIAGNOSIS — Z7952 Long term (current) use of systemic steroids: Secondary | ICD-10-CM

## 2018-03-24 DIAGNOSIS — R12 Heartburn: Secondary | ICD-10-CM | POA: Diagnosis present

## 2018-03-24 DIAGNOSIS — M545 Low back pain: Secondary | ICD-10-CM | POA: Diagnosis present

## 2018-03-24 DIAGNOSIS — I6389 Other cerebral infarction: Secondary | ICD-10-CM

## 2018-03-24 DIAGNOSIS — R1310 Dysphagia, unspecified: Secondary | ICD-10-CM | POA: Diagnosis present

## 2018-03-24 DIAGNOSIS — M4800 Spinal stenosis, site unspecified: Secondary | ICD-10-CM | POA: Diagnosis present

## 2018-03-24 DIAGNOSIS — I69398 Other sequelae of cerebral infarction: Secondary | ICD-10-CM | POA: Diagnosis present

## 2018-03-24 DIAGNOSIS — I63511 Cerebral infarction due to unspecified occlusion or stenosis of right middle cerebral artery: Secondary | ICD-10-CM | POA: Diagnosis present

## 2018-03-24 DIAGNOSIS — F419 Anxiety disorder, unspecified: Secondary | ICD-10-CM | POA: Diagnosis present

## 2018-03-24 DIAGNOSIS — Z96641 Presence of right artificial hip joint: Secondary | ICD-10-CM | POA: Diagnosis present

## 2018-03-24 DIAGNOSIS — I6523 Occlusion and stenosis of bilateral carotid arteries: Secondary | ICD-10-CM | POA: Diagnosis present

## 2018-03-24 DIAGNOSIS — E876 Hypokalemia: Secondary | ICD-10-CM

## 2018-03-24 DIAGNOSIS — F17210 Nicotine dependence, cigarettes, uncomplicated: Secondary | ICD-10-CM | POA: Diagnosis present

## 2018-03-24 DIAGNOSIS — I639 Cerebral infarction, unspecified: Secondary | ICD-10-CM

## 2018-03-24 DIAGNOSIS — I160 Hypertensive urgency: Secondary | ICD-10-CM | POA: Diagnosis not present

## 2018-03-24 DIAGNOSIS — M19031 Primary osteoarthritis, right wrist: Secondary | ICD-10-CM | POA: Diagnosis present

## 2018-03-24 LAB — ECHOCARDIOGRAM COMPLETE
Height: 64 in
Weight: 1813.063 oz

## 2018-03-24 MED ORDER — ONDANSETRON HCL 4 MG/2ML IJ SOLN: 4.0000 mg | Freq: Four times a day (QID) | INTRAMUSCULAR | Status: DC | PRN

## 2018-03-24 MED ORDER — PANTOPRAZOLE SODIUM 40 MG PO TBEC
40.0000 mg | DELAYED_RELEASE_TABLET | Freq: Every day | ORAL | Status: DC
  Administered 2018-03-25 – 2018-04-01 (×8): 40 mg via ORAL
  Filled 2018-03-24 (×8): qty 1

## 2018-03-24 MED ORDER — ONDANSETRON HCL 4 MG PO TABS
4.0000 mg | ORAL_TABLET | Freq: Four times a day (QID) | ORAL | Status: DC | PRN
  Administered 2018-03-28 – 2018-04-01 (×8): 4 mg via ORAL
  Filled 2018-03-24 (×8): qty 1

## 2018-03-24 MED ORDER — ASPIRIN EC 81 MG PO TBEC
81.0000 mg | DELAYED_RELEASE_TABLET | Freq: Every day | ORAL | Status: DC
  Administered 2018-03-25 – 2018-04-01 (×8): 81 mg via ORAL
  Filled 2018-03-24 (×8): qty 1

## 2018-03-24 MED ORDER — MONTELUKAST SODIUM 10 MG PO TABS
10.0000 mg | ORAL_TABLET | Freq: Every day | ORAL | Status: DC
  Administered 2018-03-25 – 2018-04-01 (×8): 10 mg via ORAL
  Filled 2018-03-24 (×8): qty 1

## 2018-03-24 MED ORDER — FLEET ENEMA 7-19 GM/118ML RE ENEM: 1.0000 | ENEMA | Freq: Once | RECTAL | Status: DC | PRN

## 2018-03-24 MED ORDER — BISACODYL 10 MG RE SUPP: 10.0000 mg | Freq: Every day | RECTAL | Status: DC | PRN

## 2018-03-24 MED ORDER — SERTRALINE HCL 50 MG PO TABS
150.0000 mg | ORAL_TABLET | Freq: Every day | ORAL | Status: DC
  Administered 2018-03-25 – 2018-04-01 (×8): 150 mg via ORAL
  Filled 2018-03-24 (×8): qty 1

## 2018-03-24 MED ORDER — ADULT MULTIVITAMIN W/MINERALS CH
1.0000 | ORAL_TABLET | Freq: Every day | ORAL | Status: DC
  Administered 2018-03-25 – 2018-04-01 (×8): 1 via ORAL
  Filled 2018-03-24 (×8): qty 1

## 2018-03-24 MED ORDER — FLUTICASONE PROPIONATE 50 MCG/ACT NA SUSP
2.0000 | Freq: Every day | NASAL | Status: DC | PRN
  Filled 2018-03-24: qty 16

## 2018-03-24 MED ORDER — POLYETHYLENE GLYCOL 3350 17 G PO PACK
17.0000 g | PACK | Freq: Every day | ORAL | Status: DC | PRN
  Administered 2018-03-30: 17 g via ORAL
  Filled 2018-03-24: qty 1

## 2018-03-24 MED ORDER — ALUM & MAG HYDROXIDE-SIMETH 200-200-20 MG/5ML PO SUSP: 30.0000 mL | ORAL | Status: DC | PRN

## 2018-03-24 MED ORDER — HYDROCODONE-ACETAMINOPHEN 7.5-325 MG PO TABS
1.0000 | ORAL_TABLET | Freq: Four times a day (QID) | ORAL | Status: DC | PRN
  Administered 2018-03-28 – 2018-03-29 (×3): 1 via ORAL
  Filled 2018-03-24 (×3): qty 1

## 2018-03-24 MED ORDER — ENOXAPARIN SODIUM 40 MG/0.4ML ~~LOC~~ SOLN
40.0000 mg | SUBCUTANEOUS | Status: DC
  Administered 2018-03-24 – 2018-03-31 (×8): 40 mg via SUBCUTANEOUS
  Filled 2018-03-24 (×8): qty 0.4

## 2018-03-24 MED ORDER — NICOTINE 14 MG/24HR TD PT24
14.0000 mg | MEDICATED_PATCH | Freq: Every day | TRANSDERMAL | Status: DC
  Administered 2018-03-25 – 2018-04-01 (×8): 14 mg via TRANSDERMAL
  Filled 2018-03-24 (×8): qty 1

## 2018-03-24 MED ORDER — TROLAMINE SALICYLATE 10 % EX CREA
TOPICAL_CREAM | Freq: Three times a day (TID) | CUTANEOUS | Status: DC
  Filled 2018-03-24: qty 85

## 2018-03-24 MED ORDER — GUAIFENESIN-DM 100-10 MG/5ML PO SYRP: 5.0000 mL | ORAL_SOLUTION | Freq: Four times a day (QID) | ORAL | Status: DC | PRN

## 2018-03-24 MED ORDER — ALBUTEROL SULFATE (2.5 MG/3ML) 0.083% IN NEBU: 3.0000 mL | INHALATION_SOLUTION | RESPIRATORY_TRACT | Status: DC | PRN

## 2018-03-24 MED ORDER — MOMETASONE FURO-FORMOTEROL FUM 200-5 MCG/ACT IN AERO
2.0000 | INHALATION_SPRAY | Freq: Two times a day (BID) | RESPIRATORY_TRACT | Status: DC
  Administered 2018-03-25 – 2018-04-01 (×15): 2 via RESPIRATORY_TRACT
  Filled 2018-03-24: qty 8.8

## 2018-03-24 MED ORDER — OLOPATADINE HCL 0.1 % OP SOLN
1.0000 [drp] | Freq: Two times a day (BID) | OPHTHALMIC | Status: DC
  Administered 2018-03-24 – 2018-04-01 (×16): 1 [drp] via OPHTHALMIC
  Filled 2018-03-24: qty 5

## 2018-03-24 MED ORDER — TRAZODONE HCL 50 MG PO TABS
25.0000 mg | ORAL_TABLET | Freq: Every evening | ORAL | Status: DC | PRN
  Administered 2018-03-24 – 2018-03-26 (×2): 50 mg via ORAL
  Filled 2018-03-24 (×2): qty 1

## 2018-03-24 MED ORDER — ENOXAPARIN SODIUM 40 MG/0.4ML ~~LOC~~ SOLN
40.0000 mg | SUBCUTANEOUS | Status: DC
  Filled 2018-03-24: qty 0.4

## 2018-03-24 MED ORDER — MUSCLE RUB 10-15 % EX CREA
TOPICAL_CREAM | Freq: Four times a day (QID) | CUTANEOUS | Status: DC
  Administered 2018-03-24 – 2018-03-31 (×14): via TOPICAL
  Administered 2018-04-01: 1 via TOPICAL
  Filled 2018-03-24: qty 85

## 2018-03-24 MED ORDER — TIOTROPIUM BROMIDE MONOHYDRATE 18 MCG IN CAPS
18.0000 ug | ORAL_CAPSULE | Freq: Every day | RESPIRATORY_TRACT | Status: DC
  Administered 2018-03-25 – 2018-03-31 (×7): 18 ug via RESPIRATORY_TRACT
  Filled 2018-03-24 (×3): qty 5

## 2018-03-24 MED ORDER — METOCLOPRAMIDE HCL 5 MG PO TABS
5.0000 mg | ORAL_TABLET | Freq: Four times a day (QID) | ORAL | Status: DC
  Administered 2018-03-24 – 2018-04-01 (×30): 5 mg via ORAL
  Filled 2018-03-24 (×32): qty 1

## 2018-03-24 MED ORDER — ACETAMINOPHEN 325 MG PO TABS
325.0000 mg | ORAL_TABLET | ORAL | Status: DC | PRN
  Administered 2018-03-28: 650 mg via ORAL
  Administered 2018-03-29: 500 mg via ORAL
  Administered 2018-03-29 – 2018-03-30 (×3): 650 mg via ORAL
  Filled 2018-03-24 (×3): qty 2
  Filled 2018-03-24: qty 1
  Filled 2018-03-24: qty 2

## 2018-03-24 MED ORDER — CLOPIDOGREL BISULFATE 75 MG PO TABS
75.0000 mg | ORAL_TABLET | Freq: Every day | ORAL | Status: DC
  Administered 2018-03-25 – 2018-04-01 (×8): 75 mg via ORAL
  Filled 2018-03-24 (×8): qty 1

## 2018-03-24 MED ORDER — ALBUTEROL SULFATE (2.5 MG/3ML) 0.083% IN NEBU: 3.0000 mL | INHALATION_SOLUTION | Freq: Four times a day (QID) | RESPIRATORY_TRACT | Status: DC | PRN

## 2018-03-24 MED ORDER — DIPHENHYDRAMINE HCL 12.5 MG/5ML PO ELIX: 12.5000 mg | ORAL_SOLUTION | Freq: Four times a day (QID) | ORAL | Status: DC | PRN

## 2018-03-24 MED ORDER — MIRTAZAPINE 15 MG PO TABS
30.0000 mg | ORAL_TABLET | Freq: Every day | ORAL | Status: DC
  Administered 2018-03-24 – 2018-03-26 (×3): 30 mg via ORAL
  Filled 2018-03-24 (×3): qty 2

## 2018-03-24 MED ORDER — NICOTINE 14 MG/24HR TD PT24: 14.0000 mg | MEDICATED_PATCH | Freq: Every day | TRANSDERMAL | Status: DC

## 2018-03-24 MED ORDER — OXYCODONE HCL ER 10 MG PO T12A
10.0000 mg | EXTENDED_RELEASE_TABLET | Freq: Two times a day (BID) | ORAL | Status: DC
  Administered 2018-03-24 – 2018-04-01 (×16): 10 mg via ORAL
  Filled 2018-03-24 (×16): qty 1

## 2018-03-24 NOTE — Progress Notes (Signed)
PMR Admission Coordinator Pre-Admission Assessment  Patient: Pam Johnson is an 67 y.o., female MRN: [id number] DOB: 09-24-1951 Height: 5\' 4"  (162.6 cm) Weight: 51.4 kg (113 lb 5.1 oz)                                                                                                                                                  Insurance Information HMO:     PPO:      PCP:      IPA:      80/20:      OTHER:  PRIMARY: Medicare A & B      Policy#: [id number]      Subscriber: Self CM Name:       Phone#:      Fax#:  Pre-Cert#: Eligible       Employer: Retired Benefits:  Phone #: Verified online     Name: [employer]  Eff. Date: A:10/25/1988 B:02/23/2008     Deduct: $1364      Out of Pocket Max: N/A      Life Max: N/A CIR: 100%      SNF: 100% days 1-20; 80% days 21-100 Outpatient: 80%     Co-Pay: 20% Home Health: 100%      Co-Pay: $0 DME: 80%     Co-Pay: 20% Providers: Patient's Choice   SECONDARY: None        Medicaid Application Date:       Case Manager:  Disability Application Date:       Case Worker:   Emergency Contact Information         Contact Information    Name Relation Home Work [place] Jr.,Huel Spouse   [phone]   Merian Capron Daughter   [phone]   Willa Frater   [phone]     Current Medical History  Patient Admitting Diagnosis: Right occipital lobe infarct  History of Present Illness: Pam Purington Smithis a 67 y.o.right-handed femalewith history of spinal stenosis, tobacco abuse, hypertension. Presented 03/22/2018 with left-sided weakness and facial droop. Husband had reported a fall last week that was unwitnessed. Per chart review patient lives with spouse. Two-level home with bedroom on main and one-step to entry. She uses a straight point cane prior to admission. Husband can assist as needed. 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 Remote anterior right frontal cortex and white matter  infarction. CT angiogram of head and neck showed severe proximal left subclavian stenosis. Severe right vertebral artery origin stenosis. 80% proximal left ICA stenosis. Nearly 50% proximal left common carotid stenosis. CT cerebral perfusion scan again with right ICA occlusion. Infarct right MCA/PCA distribution. Patient did not receive TPA. Echocardiogram done. Interventional radiology consulted for plan of left ICA stenting outpatient. Currently maintained on aspirin and Plavix for CVA prophylaxis. Subcutaneous heparin for DVT prophylaxis. Dysphasia 3  textures and thin liquids. Physical and occupational therapy evaluations completed with recommendations of physical medicine rehab consult patient admitted to Inpatient Rehabilitation 03/24/18.  NIH Total: 4  Past Medical History      Past Medical History:  Diagnosis Date  . Acid reflux   . COPD (chronic obstructive pulmonary disease) (HCC)   . Hypertension   . Sacral fracture (HCC)   . Spinal stenosis     Family History  family history includes Hypertension in her father and mother.  Prior Rehab/Hospitalizations:  Has the patient had major surgery during 100 days prior to admission? Yes  Current Medications   Current Facility-Administered Medications:  .   stroke: mapping our early stages of recovery book, , Does not apply, Once, Biby, Sharon L, NP .  0.9 %  sodium chloride infusion, , Intravenous, Continuous, Biby, Sharon L, NP, Last Rate: 75 mL/hr at 03/24/18 0600 .  acetaminophen (TYLENOL) tablet 650 mg, 650 mg, Oral, Q6H PRN, Annie MainBiby, Sharon L, NP, 650 mg at 03/24/18 0517 .  albuterol (PROVENTIL) (2.5 MG/3ML) 0.083% nebulizer solution 3 mL, 3 mL, Inhalation, Q6H PRN, Biby, Sharon L, NP .  aspirin EC tablet 81 mg, 81 mg, Oral, Daily, Biby, Sharon L, NP, 81 mg at 03/24/18 1017 .  clopidogrel (PLAVIX) tablet 75 mg, 75 mg, Oral, Daily, Biby, Sharon L, NP, 75 mg at 03/24/18 1016 .  eptifibatide (INTEGRILIN)  injection, , , PRN, Deveshwar, Sanjeev, MD, 1.5 mg at 03/22/18 1515 .  fluticasone (FLONASE) 50 MCG/ACT nasal spray 2 spray, 2 spray, Each Nare, Daily PRN, Annie MainBiby, Sharon L, NP, 2 spray at 03/23/18 1648 .  heparin injection 5,000 Units, 5,000 Units, Subcutaneous, Q8H, Biby, Sharon L, NP, 5,000 Units at 03/24/18 0511 .  HYDROcodone-acetaminophen (NORCO) 7.5-325 MG per tablet 1 tablet, 1 tablet, Oral, Q6H PRN, Layne BentonBiby, Sharon L, NP, 1 tablet at 03/23/18 2235 .  metoCLOPramide (REGLAN) tablet 5 mg, 5 mg, Oral, QID, Biby, Sharon L, NP, 5 mg at 03/24/18 1017 .  mirtazapine (REMERON) tablet 30 mg, 30 mg, Oral, QHS, Biby, Sharon L, NP, 30 mg at 03/23/18 2115 .  mometasone-formoterol (DULERA) 200-5 MCG/ACT inhaler 2 puff, 2 puff, Inhalation, BID, Biby, Sharon L, NP, 2 puff at 03/24/18 0739 .  montelukast (SINGULAIR) tablet 10 mg, 10 mg, Oral, Daily, Biby, Sharon L, NP, 10 mg at 03/23/18 1536 .  multivitamin with minerals tablet 1 tablet, 1 tablet, Oral, Daily, Biby, Sharon L, NP, 1 tablet at 03/24/18 1016 .  olopatadine (PATANOL) 0.1 % ophthalmic solution 1 drop, 1 drop, Both Eyes, BID, Biby, Sharon L, NP, 1 drop at 03/24/18 1016 .  ondansetron (ZOFRAN) injection 4 mg, 4 mg, Intravenous, Q6H PRN, Annie MainBiby, Sharon L, NP, 4 mg at 03/23/18 0205 .  sertraline (ZOLOFT) tablet 150 mg, 150 mg, Oral, Daily, Biby, Sharon L, NP, 150 mg at 03/24/18 1016 .  tiotropium (SPIRIVA) inhalation capsule 18 mcg, 18 mcg, Inhalation, Daily, Biby, Sharon L, NP, 18 mcg at 03/24/18 [id number]  Patients Current Diet:       Diet Order           DIET DYS 3 Room service appropriate? Yes; Fluid consistency: Thin  Diet effective now          Precautions / Restrictions Precautions Precautions: Fall Restrictions Weight Bearing Restrictions: No   Has the patient had 2 or more falls or a fall with injury in the past year?Yes  Prior Activity Level Limited Community (1-2x/wk): Prior to admission patient went out with spouse a  couple  times a week for errands and medical appointments.    Home Assistive Devices / Equipment Home Assistive Devices/Equipment: Environmental consultant (specify type), Dentures (specify type), Eyeglasses Home Equipment: Walker - 4 wheels, Bedside commode, Shower seat, Cane - single point  Prior Device Use: Indicate devices/aids used by the patient prior to current illness, exacerbation or injury? Dan Humphreys that had she had progressed off of after she had her hip replacement in Feb. 2019  Prior Functional Level Prior Function Level of Independence: Needs assistance Gait / Transfers Assistance Needed: [facility] ADL's / Homemaking Assistance Needed: spouse makes meals but she gets her own lunch; Pt is supervision for bathing in tub  Self Care: Did the patient need help bathing, dressing, using the toilet or eating? Independent  Indoor Mobility: Did the patient need assistance with walking from room to room (with or without device)? Independent  Stairs: Did the patient need assistance with internal or external stairs (with or without device)? Independent  Functional Cognition: Did the patient need help planning regular tasks such as shopping or remembering to take medications? Independent  Current Functional Level Cognition  Arousal/Alertness: Awake/alert Overall Cognitive Status: Impaired/Different from baseline Current Attention Level: Selective Orientation Level: Oriented X4 Following Commands: Follows one step commands consistently Safety/Judgement: Decreased awareness of safety, Decreased awareness of deficits General Comments: Pt with decreased safety awareness, very impulsive and quick moving then gets very frustrated when asked to slow down. Then apologizes for being rude.  Attention: Focused Focused Attention: Appears intact Memory: Appears intact Awareness: Appears intact Problem Solving: Appears intact Safety/Judgment: Appears intact    Extremity Assessment (includes  Sensation/Coordination)  Upper Extremity Assessment: Defer to OT evaluation RUE Deficits / Details: in wrist splint for arthritis, bruising from fall "about 2 weeks ago" generalized weakness, full ROM, decreased coordination and dropping of ADL objects RUE: Unable to fully assess due to immobilization RUE Sensation: WNL RUE Coordination: decreased fine motor LUE Deficits / Details: strength grossly 3+/5, able to move LUE in all planes of motion. movement is not smooth, and requires cues to use LUE during functional tasks LUE Coordination: decreased fine motor  Lower Extremity Assessment: Generalized weakness    ADLs  Overall ADL's : Needs assistance/impaired Eating/Feeding: Moderate assistance, Sitting Eating/Feeding Details (indicate cue type and reason): trouble holding onto utensils, husband feeding when OT entered "I don't really have an appetite right now" Grooming: Wash/dry hands, Brushing hair, Sitting, Minimal assistance Grooming Details (indicate cue type and reason): due to HR, pt performed seated grooming at the EOB Upper Body Bathing: Minimal assistance, Sitting Lower Body Bathing: Minimal assistance, Sit to/from stand Upper Body Dressing : Minimal assistance, Sitting Lower Body Dressing: Minimal assistance, Sit to/from stand Lower Body Dressing Details (indicate cue type and reason): good flexibility at hips for access to LB, min A for boost and balance while managing clothing Toilet Transfer: Minimal assistance, Ambulation, Comfort height toilet, RW, Grab bars Toilet Transfer Details (indicate cue type and reason): vc for safety and sequencing, min A for boost and balance Toileting- Clothing Manipulation and Hygiene: Minimal assistance, Sit to/from stand Toileting - Clothing Manipulation Details (indicate cue type and reason): Pt dropped toilet paper several times, attempted with both hands Tub/ Shower Transfer: Moderate assistance Functional mobility during ADLs: Minimal  assistance, Cueing for safety, Cueing for sequencing, Rolling walker General ADL Comments: very little safety awareness, fine motor coordiation deficits impact her ability to hold onto ADL tools/objects    Mobility  Overal bed mobility: Needs Assistance Bed Mobility: Supine to  Sit, Sit to Supine Supine to sit: Supervision Sit to supine: Supervision General bed mobility comments: pt up in recliner    Transfers  Overall transfer level: Needs assistance Equipment used: Rolling walker (2 wheeled) Transfers: Sit to/from Stand Sit to Stand: Mod assist General transfer comment: assist for lines, balance, safety, lifting    Ambulation / Gait / Stairs / Wheelchair Mobility  Ambulation/Gait Ambulation/Gait assistance: Min assist Ambulation Distance (Feet): 10 Feet Assistive device: Rolling walker (2 wheeled) Gait Pattern/deviations: Step-to pattern, Decreased stride length, Trunk flexed General Gait Details: off balance and with decreased L side awareness not using walker on her L side without cues and assist decreased safety awareness    Posture / Balance Dynamic Sitting Balance Sitting balance - Comments: able to sit EOB without assist Balance Overall balance assessment: Needs assistance Sitting-balance support: Feet supported, No upper extremity supported Sitting balance-Leahy Scale: Fair Sitting balance - Comments: able to sit EOB without assist Standing balance support: Bilateral upper extremity supported, During functional activity Standing balance-Leahy Scale: Poor Standing balance comment: support needed for balance    Special needs/care consideration BiPAP/CPAP: No CPM: No Continuous Drip IV: No Dialysis: No         Life Vest: No Oxygen: Yes, 2L nasal cannula most of the time PTA  Special Bed: No Trach Size: No Wound Vac (area): No       Skin: Ecchymosis to bilateral arms and legs                                Bowel mgmt: Continent, last BM 03/24/18 Bladder mgmt:  Continent  Diabetic mgmt: No, HbgA1c 5.6     Previous Home Environment Living Arrangements: Spouse/significant other  Lives With: Spouse Available Help at Discharge: Family, Available PRN/intermittently Type of Home: House Home Layout: Two level, Able to live on main level with bedroom/bathroom Alternate Level Stairs-Number of Steps: stays on main Home Access: Stairs to enter Entrance Stairs-Rails: None Entrance Stairs-Number of Steps: 1 Bathroom Shower/Tub: Engineer, manufacturing systems: Standard(toilet riser with handles) Bathroom Accessibility: Yes How Accessible: Accessible via wheelchair Home Care Services: No  Discharge Living Setting Plans for Discharge Living Setting: Patient's home, Lives with (comment)(Spouse ) Type of Home at Discharge: House Discharge Home Layout: Two level, Able to live on main level with bedroom/bathroom Discharge Home Access: Stairs to enter Entrance Stairs-Rails: Can reach both Entrance Stairs-Number of Steps: 1 Discharge Bathroom Shower/Tub: Tub/shower unit Discharge Bathroom Toilet: Standard Discharge Bathroom Accessibility: Yes How Accessible: Accessible via walker Does the patient have any problems obtaining your medications?: No  Social/Family/Support Systems Patient Roles: Spouse, Parent Contact Information: Spouse "HW" Anticipated Caregiver: Spouse Anticipated Caregiver's Contact Information: Cell:[phone] Ability/Limitations of Caregiver: Supervision  Caregiver Availability: 24/7 Discharge Plan Discussed with Primary Caregiver: Yes Is Caregiver In Agreement with Plan?: Yes Does Caregiver/Family have Issues with Lodging/Transportation while Pt is in Rehab?: No  Goals/Additional Needs Patient/Family Goal for Rehab: PT/OT: Mod I-Supervision; SLP: Mod I  Expected length of stay: 8-11 days  Cultural Considerations: Is ok with speaking with a minister  Dietary Needs: Dys.3 textures and thin liquids  Equipment Needs:  TBD Special Service Needs: None Additional Information: Pt. with history of pain management and wants to discuss home list with MD or PA  Pt/Family Agrees to Admission and willing to participate: Yes Program Orientation Provided & Reviewed with Pt/Caregiver Including Roles  & Responsibilities: Yes Additional Information Needs: asking questions about right wrist  and pain meds  Information Needs to be Provided By: Medical team   Decrease burden of Care through IP rehab admission: No  Possible need for SNF placement upon discharge: No  Patient Condition: This patient's condition remains as documented in the consult dated 03/24/18, in which the Rehabilitation Physician determined and documented that the patient's condition is appropriate for intensive rehabilitative care in an inpatient rehabilitation facility. Will admit to inpatient rehab today.  Preadmission Screen Completed By:  Fae Pippin, 03/24/2018 1:59 PM ______________________________________________________________________   Discussed status with Dr. Riley Kill on 03/24/18 at 1410 and received telephone approval for admission today.  Admission Coordinator:  Fae Pippin, time 1410/Date 03/24/18             Cosigned by: Ranelle Oyster, MD at 03/24/2018 2:21 PM  Revision History

## 2018-03-24 NOTE — H&P (Addendum)
Physical Medicine and Rehabilitation Admission H&P     Chief Complaint  Patient presents with  . Functional deficits   HPI: Pam Johnson is a 67 year old female with history of HTN, chronic pain due to spinal stenosis (Dr. Comer Locket) , COPD, recent fall who was admitted on 03/22/18 with left sided weakness, tendency to run into things on the left for few days and facial droop. UDS positive for THC and opiates. 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 She underwent CTA angio with unsuccessful revascularization of acutely occluded prox R-ICA. She had hypotensive episode while in CT scanner and was treated with fluid bolus. Repeat CT head showed mild evolution with right cytotoxic edema and right hemisphere core infarct perhaps doubled in size and interval development of L-MCA thromboembolism --finding not supported by exam per neuro. Dr. Pearlean Brownie recommends ASA/plavix for 3 months followed by ASA alone.  MRI brain not done due to claustrophobia.  2D echo done today  Patient with resultant left sided weakness, left inattention and difficulty performing tasks. CIR recommended due to functional deficits.     Review of Systems  Constitutional: Negative for chills and fever.  HENT: Negative for hearing loss and tinnitus.  Respiratory: Positive for shortness of breath and wheezing. Negative for cough.  Cardiovascular: Negative for chest pain and palpitations.  Gastrointestinal: Positive for heartburn. Negative for abdominal pain and constipation.  Genitourinary: Negative for dysuria and urgency.  Musculoskeletal: Positive for back pain, joint pain and myalgias.  Skin: Negative for itching and  rash.  Neurological: Positive for dizziness, focal weakness and headaches (frequent --since hip surgery).  Psychiatric/Behavioral: The patient is nervous/anxious and has insomnia.       Past Medical History:  Diagnosis Date  . Acid reflux   . COPD (chronic obstructive pulmonary disease) (HCC)   . Hypertension   . Sacral fracture (HCC)   . Spinal stenosis         Past Surgical History:  Procedure Laterality Date  . ABDOMINAL HYSTERECTOMY    . CHOLECYSTECTOMY    . HERNIA REPAIR    . IR ANGIO INTRA EXTRACRAN SEL COM CAROTID INNOMINATE BILAT MOD SED  03/22/2018  . IR ANGIO VERTEBRAL SEL VERTEBRAL UNI R MOD SED  03/22/2018  . IR CT HEAD LTD  03/22/2018  . IR PERCUTANEOUS ART THROMBECTOMY/INFUSION INTRACRANIAL INC DIAG ANGIO  03/22/2018  . NISSEN FUNDOPLICATION    . RADIOLOGY WITH ANESTHESIA N/A 03/22/2018   Procedure: IR WITH ANESTHESIA; Surgeon: Julieanne Cotton, MD; Location: [facility] OR; Service: Radiology; Laterality: N/A;  . TOTAL HIP ARTHROPLASTY Right 11/2017        Family History  Problem Relation Age of Onset  . Hypertension Mother   . Hypertension Father    Social History: Married. Independent with cane and furniture walked. Needed supervision for bathing. Per reports that she has been smoking cigarettes. She has been smoking about 1.00 pack per day. She does not have any smokeless tobacco history on file. She reports that she drinks alcohol. She reports that she does not use drugs.       Allergies  Allergen Reactions  . Acyclovir And Related Other (See Comments)    unknown  . Ciprofloxacin Other (See Comments)    Altered mental status.  Marland Kitchen  Ciprofloxacin Other (See Comments)    Confused and nausea   . Penicillins Other (See Comments)    Unknown reaction  Has patient had a PCN reaction causing immediate rash, facial/tongue/throat swelling, SOB or lightheadedness with hypotension: NO  Has patient had a PCN reaction causing severe rash involving mucus membranes or skin  necrosis: NO  Has patient had a PCN reaction that required hospitalization: NO  Has patient had a PCN reaction occurring within the last 10 years: NO  If all of the above answers are "NO", then may proceed with Cephalosporin use.   . Promethazine Other (See Comments)    Was advised by her PCP not to take this medication.  . Morphine Nausea And Vomiting         Medications Prior to Admission  Medication Sig Dispense Refill  . albuterol (PROVENTIL HFA;VENTOLIN HFA) 108 (90 BASE) MCG/ACT inhaler Inhale 2 puffs into the lungs every 6 (six) hours as needed for wheezing or shortness of breath.    Marland Kitchen amLODipine (NORVASC) 2.5 MG tablet Take 2.5 mg by mouth daily.    Marland Kitchen esomeprazole (NEXIUM) 40 MG capsule Take 40 mg by mouth 2 (two) times daily.    . fluticasone (FLONASE) 50 MCG/ACT nasal spray Place 2 sprays into both nostrils daily as needed for allergies or rhinitis.    . Fluticasone-Salmeterol (ADVAIR) 500-50 MCG/DOSE AEPB Inhale 1 puff into the lungs 2 (two) times daily.    Marland Kitchen gabapentin (NEURONTIN) 600 MG tablet Take 600 mg by mouth 3 (three) times daily.    . hydrochlorothiazide (HYDRODIURIL) 25 MG tablet Take 25 mg by mouth daily.    Marland Kitchen HYDROcodone-acetaminophen (NORCO) 7.5-325 MG tablet Take 1 tablet by mouth every 6 (six) hours as needed (breakthrough pain).    . hydrocortisone cream 1 % Apply 1 application topically 2 (two) times daily as needed for itching.    Marland Kitchen ipratropium-albuterol (DUONEB) 0.5-2.5 (3) MG/3ML SOLN Take 3 mLs by nebulization every 6 (six) hours. (Patient taking differently: Take 3 mLs by nebulization every 6 (six) hours as needed (shortness of breath). ) 360 mL 0  . metoCLOPramide (REGLAN) 5 MG tablet Take 5 mg by mouth 4 (four) times daily.    . mirtazapine (REMERON) 30 MG tablet Take 30 mg by mouth at bedtime.    . montelukast (SINGULAIR) 10 MG tablet Take 10 mg by mouth every morning.    . Multiple Vitamin (MULTIVITAMIN WITH MINERALS) TABS tablet Take 1 tablet by mouth  daily.    . nitroGLYCERIN (NITROSTAT) 0.4 MG SL tablet Place 0.4 mg under the tongue every 5 (five) minutes as needed for chest pain.    Marland Kitchen olopatadine (PATANOL) 0.1 % ophthalmic solution Place 1 drop into both eyes 2 (two) times daily.    . ondansetron (ZOFRAN ODT) 4 MG disintegrating tablet Take 1 tablet (4 mg total) by mouth every 8 (eight) hours as needed for nausea or vomiting. 20 tablet 0  . oxyCODONE (OXYCONTIN) 20 mg 12 hr tablet Take 20 mg by mouth 3 (three) times daily.    . sertraline (ZOLOFT) 100 MG tablet Take 150 mg by mouth daily.    Marland Kitchen tiotropium (SPIRIVA) 18 MCG inhalation capsule Place 18 mcg into inhaler and inhale daily.    Marland Kitchen triamcinolone cream (KENALOG) 0.5 % Apply 1 application topically daily as needed (rash).     Drug Regimen Review  Drug regimen was reviewed and remains appropriate with no significant issues identified  Home:  Home Living  Family/patient expects to  be discharged to:: Private residence  Living Arrangements: Spouse/significant other  Available Help at Discharge: Family, Available PRN/intermittently  Type of Home: House  Home Access: Stairs to enter  Secretary/administrator of Steps: 1  Entrance Stairs-Rails: None  Home Layout: Two level, Able to live on main level with bedroom/bathroom  Alternate Level Stairs-Number of Steps: stays on main  Bathroom Shower/Tub: Medical sales representative: Standard(toilet riser with handles)  Bathroom Accessibility: Yes  Home Equipment: Environmental consultant - 4 wheels, Bedside commode, Shower seat, Medical laboratory scientific officer - single point  Lives With: Spouse  Functional History:  Prior Function  Level of Independence: Needs assistance  Gait / Transfers Assistance Needed: SPC  ADL's / Homemaking Assistance Needed: spouse makes meals but she gets her own lunch; Pt is supervision for bathing in tub  Functional Status:  Mobility:  Bed Mobility  Overal bed mobility: Needs Assistance  Bed Mobility: Supine to Sit, Sit to Supine  Supine to sit:  Supervision  Sit to supine: Supervision  General bed mobility comments: pt up in recliner  Transfers  Overall transfer level: Needs assistance  Equipment used: Rolling walker (2 wheeled)  Transfers: Sit to/from Stand  Sit to Stand: Mod assist  General transfer comment: assist for lines, balance, safety, lifting  Ambulation/Gait  Ambulation/Gait assistance: Min assist  Ambulation Distance (Feet): 10 Feet  Assistive device: Rolling walker (2 wheeled)  Gait Pattern/deviations: Step-to pattern, Decreased stride length, Trunk flexed  General Gait Details: off balance and with decreased L side awareness not using walker on her L side without cues and assist decreased safety awareness   ADL:  ADL  Overall ADL's : Needs assistance/impaired  Eating/Feeding: Moderate assistance, Sitting  Eating/Feeding Details (indicate cue type and reason): trouble holding onto utensils, husband feeding when OT entered "I don't really have an appetite right now"  Grooming: Wash/dry hands, Brushing hair, Sitting, Minimal assistance  Grooming Details (indicate cue type and reason): due to HR, pt performed seated grooming at the EOB  Upper Body Bathing: Minimal assistance, Sitting  Lower Body Bathing: Minimal assistance, Sit to/from stand  Upper Body Dressing : Minimal assistance, Sitting  Lower Body Dressing: Minimal assistance, Sit to/from stand  Lower Body Dressing Details (indicate cue type and reason): good flexibility at hips for access to LB, min A for boost and balance while managing clothing  Toilet Transfer: Minimal assistance, Ambulation, Comfort height toilet, RW, Grab bars  Toilet Transfer Details (indicate cue type and reason): vc for safety and sequencing, min A for boost and balance  Toileting- Clothing Manipulation and Hygiene: Minimal assistance, Sit to/from stand  Toileting - Clothing Manipulation Details (indicate cue type and reason): Pt dropped toilet paper several times, attempted with both  hands  Tub/ Shower Transfer: Moderate assistance  Functional mobility during ADLs: Minimal assistance, Cueing for safety, Cueing for sequencing, Rolling walker  General ADL Comments: very little safety awareness, fine motor coordiation deficits impact her ability to hold onto ADL tools/objects  Cognition:  Cognition  Overall Cognitive Status: Impaired/Different from baseline  Arousal/Alertness: Awake/alert  Orientation Level: Oriented X4  Attention: Focused  Focused Attention: Appears intact  Memory: Appears intact  Awareness: Appears intact  Problem Solving: Appears intact  Safety/Judgment: Appears intact  Cognition  Arousal/Alertness: Awake/alert  Behavior During Therapy: Impulsive  Overall Cognitive Status: Impaired/Different from baseline  Area of Impairment: Attention, Following commands, Safety/judgement, Awareness, Problem solving  Current Attention Level: Selective  Following Commands: Follows one step commands consistently  Safety/Judgement: Decreased awareness of safety, Decreased awareness of  deficits  Awareness: Emergent  Problem Solving: Requires verbal cues, Requires tactile cues, Difficulty sequencing  General Comments: Pt with decreased safety awareness, very impulsive and quick moving then gets very frustrated when asked to slow down. Then apologizes for being rude.     Blood pressure (!) 176/94, pulse 69, temperature 99.6 F (37.6 C), temperature source Oral, resp. rate 20, height  (1.626 m), weight 51.4 kg (113 lb 5.1 oz), SpO2 98 %.  Physical Exam  Nursing note and vitals reviewed.  Constitutional: She appears cachectic. No distress. Nasal cannula in place.  Frail appearing  HENT:  Ecchymotic areas on left forehead (with induration and scabbing) and bilateral cheeks.  Eyes: Pupils are equal, round, and reactive to light. EOM are normal.  Neck: Normal range of motion. No tracheal deviation present. No thyromegaly present.  Cardiovascular: Normal rate and  regular rhythm. Exam reveals no gallop and no friction rub.  No murmur heard.  Respiratory: No respiratory distress. She has no wheezes.  barrel chested. Audible intermittent wheezing.  Neurological:  Oriented to name, month, place, reason. Fair insight and awareness. Discussed biographical information. Has difficulty with vision to left midline on confrontation testing but not a complete hemianopsia. LUE 4/5 prox to distal. LLE 4- prox to 4/5 distal. Light touch 1/2 left arm and leg. RUe and RLE 5/5.   Skin: She is not diaphoretic.  BUE with multiple healed skin tears and ecchymotic areas. Bilateral shins with multiple petechial areas (due to trauma)  Psychiatric: She has a normal mood and affect. Her behavior is normal.   Lab Results Last 48 Hours  Imaging Results  (Last 48 hours)     Medical Problem List and Plan:  1. Functional and mobility deficits secondary to right acute/subacute parietal-occipital lobe infarct  -admit to inpatient rehab  2. DVT Prophylaxis/Anticoagulation: Pharmaceutical: Lovenox  3. Chronic LBP pain/Pain Management: Was on Oxycontin 20 mg tid/ Lyrica tid with hydrocodone qid prn. Will resume OxyContin at 10 mg bid and monitor for tolerance. Continue hydrocodone prn  4. Mood: LCSW to follow for evaluation and support.  5. Neuropsych: This patient is capable of making decisions on her own behalf.  6. Skin/Wound Care: routine pressure relief measures.  7. Fluids/Electrolytes/Nutrition: Monitor I/O. Check lytes in am.  8. COPD: Last exacerbation 12/2017. Continue Dulera bid, Singulair and spiriva  9. HTN: Monitor BP bid. 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  10. 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 Tobacco abuse: Continue to encourage cessation. Add nicotine patch.  12. H/o anxiety disorder: On Remeron and Zoloft daily.  13. Advanced OA right wrist: Continue splint with local measures.   Post Admission Physician Evaluation:  1. Functional deficits secondary to right occipital lobe infarct. 2. Patient is admitted to receive collaborative, interdisciplinary care between the physiatrist, rehab nursing staff, and therapy team. 3. Patient's level of medical complexity and substantial therapy needs in context of that medical necessity cannot be provided at a lesser intensity of care such as a SNF. 4. Patient has experienced substantial functional loss from his/her baseline which was documented above under the "Functional History" and "Functional Status" headings. Judging by the patient's diagnosis, physical exam, and functional history, the patient has potential for functional progress which will result in measurable gains while on inpatient rehab. These gains will be of substantial and practical use upon discharge in facilitating  mobility and self-care at the household level. 5. Physiatrist will provide 24 hour management of medical needs as well as oversight of the therapy plan/treatment and provide guidance as appropriate regarding the interaction of the two. 6. The Preadmission Screening has been reviewed and patient status is unchanged unless otherwise stated above. 7. 24 hour rehab nursing will assist with bladder management, bowel management, safety, skin/wound care, disease management, medication administration, pain management and patient education and help integrate therapy concepts, techniques,education, etc. 8. PT will assess and treat for/with: Lower extremity strength, range of motion, stamina, balance, functional mobility, safety, adaptive techniques and equipment, NMR, visual-spatial awareness, family education. Goals are: mod I to supervision. 9. OT will assess and treat for/with: .ADL's, functional mobility, safety, upper extremity strength, adaptive techniques and equipment, NMR, visual-spatial awareness, community reentry, family ed. Goals are: mod I to supervision. Therapy may proceed with showering this patient. 10. SLP will assess and treat for/with: speech, cognition, communication. Goals are: mod I. 11. Case Management and Social Worker will assess and treat for psychological issues and discharge planning. 12. Team conference will be held weekly to assess progress toward goals and to determine barriers to discharge. 13. Patient will receive at least 3 hours of therapy per day at least 5 days per week. 14. ELOS: 8-11 days  15. Prognosis: excellent   I have personally performed a face to face diagnostic evaluation of this patient and formulated the key components of the plan. Additionally, I have personally reviewed laboratory data, imaging studies, as well as relevant notes and concur with the physician assistant's documentation above.    Ranelle Oyster, MD, [place] [facility]  Jacquelynn Cree, PA-C   03/24/2018

## 2018-03-24 NOTE — Progress Notes (Signed)
Pt d/c to Inpt Rehab per MD order, pt VSS, pt verbalized understanding of d/c , report called, all questions answered

## 2018-03-24 NOTE — H&P (Signed)
Physical Medicine and Rehabilitation Admission H&P    Chief Complaint  Patient presents with  . Functional deficits     HPI: Pam Johnson is a 67 year old female with history of HTN, chronic pain due to spinal stenosis (Dr. Michail Sermon) , COPD, recent fall who was admitted on 03/22/18 with left sided weakness, tendency to run into things on the left for few days and facial droop. UDS positive for THC and opiates. 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 She underwent CTA angio with unsuccessful revascularization of acutely occluded prox R-ICA. She had hypotensive episode while in CT scanner and was treated with fluid bolus. Repeat CT head showed mild evolution with right cytotoxic edema and right hemisphere core infarct perhaps doubled in size and interval development of L-MCA thromboembolism --finding not supported by exam per  neuro.  Dr. Leonie Man recommends ASA/plavix for 3 months followed by ASA alone.  MRI brain not done due to claustrophobia.  2D echo done today  Patient with resultant left sided weakness, left inattention and  difficulty performing tasks. CIR recommended due to functional deficits.   Review of Systems  Constitutional: Negative for chills and fever.  HENT: Negative for hearing loss and tinnitus.   Respiratory: Positive for shortness of breath and wheezing. Negative for cough.   Cardiovascular: Negative for chest pain and palpitations.  Gastrointestinal: Positive for heartburn. Negative for abdominal pain and constipation.  Genitourinary: Negative for dysuria and urgency.  Musculoskeletal: Positive for back pain, joint pain and myalgias.  Skin: Negative for  itching and rash.  Neurological: Positive for dizziness, focal weakness and headaches (frequent --since hip surgery).  Psychiatric/Behavioral: The patient is nervous/anxious and has insomnia.       Past Medical History:  Diagnosis Date  . Acid reflux   . COPD (chronic obstructive pulmonary disease) ([place])   . Hypertension   . Sacral fracture ([place])   . Spinal stenosis     Past Surgical History:  Procedure Laterality Date  . ABDOMINAL HYSTERECTOMY    . CHOLECYSTECTOMY    . HERNIA REPAIR    . IR ANGIO INTRA EXTRACRAN SEL COM CAROTID INNOMINATE BILAT MOD SED  03/22/2018  . IR ANGIO VERTEBRAL SEL VERTEBRAL UNI R MOD SED  03/22/2018  . IR CT HEAD LTD  03/22/2018  . IR PERCUTANEOUS ART THROMBECTOMY/INFUSION INTRACRANIAL INC DIAG ANGIO  03/22/2018  . NISSEN FUNDOPLICATION    . RADIOLOGY WITH ANESTHESIA N/A 03/22/2018   Procedure: IR WITH ANESTHESIA;  Surgeon: Luanne Bras, MD;  Location: [facility];  Service: Radiology;  Laterality: N/A;  . TOTAL HIP ARTHROPLASTY Right 11/2017    Family History  Problem Relation Age of Onset  . Hypertension Mother   . Hypertension Father     Social History:  Married. Independent with cane and furniture walked. Needed supervision for bathing. Per  reports that she has been smoking cigarettes.  She has been smoking about 1.00 pack per day. She does not have any smokeless tobacco history on file. She reports that she drinks alcohol. She reports that she does not use drugs.    Allergies  Allergen Reactions  . Acyclovir And Related Other (See Comments)    unknown  . Ciprofloxacin Other (See Comments)  Altered mental status.  . Ciprofloxacin Other (See Comments)    Confused and nausea   . Penicillins Other (See Comments)    Unknown reaction Has patient had a PCN reaction causing immediate rash, facial/tongue/throat swelling, SOB or lightheadedness with hypotension: NO Has patient had a PCN reaction causing severe rash involving mucus membranes or  skin necrosis: NO Has patient had a PCN reaction that required hospitalization: NO Has patient had a PCN reaction occurring within the last 10 years: NO If all of the above answers are "NO", then may proceed with Cephalosporin use.   . Promethazine Other (See Comments)    Was advised by her PCP not to take this medication.  . Morphine Nausea And Vomiting    Medications Prior to Admission  Medication Sig Dispense Refill  . albuterol (PROVENTIL HFA;VENTOLIN HFA) 108 (90 BASE) MCG/ACT inhaler Inhale 2 puffs into the lungs every 6 (six) hours as needed for wheezing or shortness of breath.    Marland Kitchen amLODipine (NORVASC) 2.5 MG tablet Take 2.5 mg by mouth daily.    Marland Kitchen esomeprazole (NEXIUM) 40 MG capsule Take 40 mg by mouth 2 (two) times daily.    . fluticasone (FLONASE) 50 MCG/ACT nasal spray Place 2 sprays into both nostrils daily as needed for allergies or rhinitis.    . Fluticasone-Salmeterol (ADVAIR) 500-50 MCG/DOSE AEPB Inhale 1 puff into the lungs 2 (two) times daily.    Marland Kitchen gabapentin (NEURONTIN) 600 MG tablet Take 600 mg by mouth 3 (three) times daily.    . hydrochlorothiazide (HYDRODIURIL) 25 MG tablet Take 25 mg by mouth daily.    Marland Kitchen HYDROcodone-acetaminophen (NORCO) 7.5-325 MG tablet Take 1 tablet by mouth every 6 (six) hours as needed (breakthrough pain).    . hydrocortisone cream 1 % Apply 1 application topically 2 (two) times daily as needed for itching.    Marland Kitchen ipratropium-albuterol (DUONEB) 0.5-2.5 (3) MG/3ML SOLN Take 3 mLs by nebulization every 6 (six) hours. (Patient taking differently: Take 3 mLs by nebulization every 6 (six) hours as needed (shortness of breath). ) 360 mL 0  . metoCLOPramide (REGLAN) 5 MG tablet Take 5 mg by mouth 4 (four) times daily.    . mirtazapine (REMERON) 30 MG tablet Take 30 mg by mouth at bedtime.    . montelukast (SINGULAIR) 10 MG tablet Take 10 mg by mouth every morning.    . Multiple Vitamin (MULTIVITAMIN WITH MINERALS) TABS tablet Take 1 tablet by mouth  daily.    . nitroGLYCERIN (NITROSTAT) 0.4 MG SL tablet Place 0.4 mg under the tongue every 5 (five) minutes as needed for chest pain.    Marland Kitchen olopatadine (PATANOL) 0.1 % ophthalmic solution Place 1 drop into both eyes 2 (two) times daily.    . ondansetron (ZOFRAN ODT) 4 MG disintegrating tablet Take 1 tablet (4 mg total) by mouth every 8 (eight) hours as needed for nausea or vomiting. 20 tablet 0  . oxyCODONE (OXYCONTIN) 20 mg 12 hr tablet Take 20 mg by mouth 3 (three) times daily.    . sertraline (ZOLOFT) 100 MG tablet Take 150 mg by mouth daily.    Marland Kitchen tiotropium (SPIRIVA) 18 MCG inhalation capsule Place 18 mcg into inhaler and inhale daily.    Marland Kitchen triamcinolone cream (KENALOG) 0.5 % Apply 1 application topically daily as needed (rash).      Drug Regimen Review  Drug regimen was reviewed and remains appropriate with no significant issues identified  Home: Home Living Family/patient expects to be discharged to:: Private residence Living  Arrangements: Spouse/significant other Available Help at Discharge: Family, Available PRN/intermittently Type of Home: House Home Access: Stairs to enter [employer] of Steps: 1 Entrance Stairs-Rails: None Home Layout: Two level, Able to live on main level with bedroom/bathroom Alternate Level Stairs-Number of Steps: stays on main Bathroom Shower/Tub: Chiropodist: Standard(toilet riser with handles) Bathroom Accessibility: Yes Home Equipment: Environmental consultant - 4 wheels, Bedside commode, Shower seat, Radio producer - single point  Lives With: Spouse   Functional History: Prior Function Level of Independence: Needs assistance Gait / Transfers Assistance Needed: SPC ADL's / Homemaking Assistance Needed: spouse makes meals but she gets her own lunch; Pt is supervision for bathing in tub  Functional Status:  Mobility: Bed Mobility Overal bed mobility: Needs Assistance Bed Mobility: Supine to Sit, Sit to Supine Supine to sit: Supervision Sit  to supine: Supervision General bed mobility comments: pt up in recliner Transfers Overall transfer level: Needs assistance Equipment used: Rolling walker (2 wheeled) Transfers: Sit to/from Stand Sit to Stand: Mod assist General transfer comment: assist for lines, balance, safety, lifting Ambulation/Gait Ambulation/Gait assistance: Min assist Ambulation Distance (Feet): 10 Feet Assistive device: Rolling walker (2 wheeled) Gait Pattern/deviations: Step-to pattern, Decreased stride length, Trunk flexed General Gait Details: off balance and with decreased L side awareness not using walker on her L side without cues and assist decreased safety awareness    ADL: ADL Overall ADL's : Needs assistance/impaired Eating/Feeding: Moderate assistance, Sitting Eating/Feeding Details (indicate cue type and reason): trouble holding onto utensils, husband feeding when OT entered "I don't really have an appetite right now" Grooming: Wash/dry hands, Brushing hair, Sitting, Minimal assistance Grooming Details (indicate cue type and reason): due to HR, pt performed seated grooming at the EOB Upper Body Bathing: Minimal assistance, Sitting Lower Body Bathing: Minimal assistance, Sit to/from stand Upper Body Dressing : Minimal assistance, Sitting Lower Body Dressing: Minimal assistance, Sit to/from stand Lower Body Dressing Details (indicate cue type and reason): good flexibility at hips for access to LB, min A for boost and balance while managing clothing Toilet Transfer: Minimal assistance, Ambulation, Comfort height toilet, RW, Grab bars Toilet Transfer Details (indicate cue type and reason): vc for safety and sequencing, min A for boost and balance Toileting- Clothing Manipulation and Hygiene: Minimal assistance, Sit to/from stand Toileting - Clothing Manipulation Details (indicate cue type and reason): Pt dropped toilet paper several times, attempted with both hands Tub/ Shower Transfer: Moderate  assistance Functional mobility during ADLs: Minimal assistance, Cueing for safety, Cueing for sequencing, Rolling walker General ADL Comments: very little safety awareness, fine motor coordiation deficits impact her ability to hold onto ADL tools/objects  Cognition: Cognition Overall Cognitive Status: Impaired/Different from baseline Arousal/Alertness: Awake/alert Orientation Level: Oriented X4 Attention: Focused Focused Attention: Appears intact Memory: Appears intact Awareness: Appears intact Problem Solving: Appears intact Safety/Judgment: Appears intact Cognition Arousal/Alertness: Awake/alert Behavior During Therapy: Impulsive Overall Cognitive Status: Impaired/Different from baseline Area of Impairment: Attention, Following commands, Safety/judgement, Awareness, Problem solving Current Attention Level: Selective Following Commands: Follows one step commands consistently Safety/Judgement: Decreased awareness of safety, Decreased awareness of deficits Awareness: Emergent Problem Solving: Requires verbal cues, Requires tactile cues, Difficulty sequencing General Comments: Pt with decreased safety awareness, very impulsive and quick moving then gets very frustrated when asked to slow down. Then apologizes for being rude.    Blood pressure (!) 176/94, pulse 69, temperature 99.6 F (37.6 C), temperature source Oral, resp. rate 20, height _0  (1.626 m), weight 51.4 kg (113 lb 5.1 oz), SpO2 98 %.  Physical Exam  Nursing note and vitals reviewed. Constitutional: She appears cachectic. No distress. Nasal cannula in place.  Frail appearing  HENT:  Ecchymotic areas on left forehead (with induration and scabbing) and bilateral cheeks.   Eyes: Pupils are equal, round, and reactive to light. EOM are normal.  Neck: Normal range of motion. No tracheal deviation present. No thyromegaly present.  Cardiovascular: Normal rate and regular rhythm. Exam reveals no gallop and no friction rub.    No murmur heard. Respiratory: No respiratory distress. She has no wheezes.  barrel chested. Audible intermittent wheezing.   Neurological:  Oriented to name, month, place, reason. Fair insight and awareness. Discussed biographical information. Has difficulty with vision to left midline on confrontation testing but not a complete hemianopsia.  LUE 4/5 prox to distal. LLE 4- prox to 4/5 distal. Light touch 1/2 left arm and leg. RUe and RLE 5/5.     Skin: She is not diaphoretic.  BUE with multiple healed skin tears and ecchymotic areas. Bilateral shins with multiple petechial areas (due to trauma)  Psychiatric: She has a normal mood and affect. Her behavior is normal.    Results for orders placed or performed during the hospital encounter of 03/22/18 (from the past 48 hour(s))  I-stat chem 8, ed     Status: Abnormal   Collection Time: 03/22/18  1:10 PM  Result Value Ref Range   Sodium 137 135 - 145 mmol/L   Potassium 3.8 3.5 - 5.1 mmol/L   Chloride 100 (L) 101 - 111 mmol/L   BUN 9 6 - 20 mg/dL   Creatinine, Ser 0.80 0.44 - 1.00 mg/dL   Glucose, Bld 113 (H) 65 - 99 mg/dL   Calcium, Ion 1.07 (L) 1.15 - 1.40 mmol/L   TCO2 26 22 - 32 mmol/L   Hemoglobin 12.9 12.0 - 15.0 g/dL   HCT 38.0 36.0 - 46.0 %  Ethanol     Status: None   Collection Time: 03/22/18  1:17 PM  Result Value Ref Range   Alcohol, Ethyl (B) <10 <10 mg/dL    Comment: (NOTE) Lowest detectable limit for serum alcohol is 10 mg/dL. For medical purposes only. Performed at [facility], [place] [address] [place], [place] [address]   Protime-INR     Status: None   Collection Time: 03/22/18  1:17 PM  Result Value Ref Range   Prothrombin Time 12.8 11.4 - 15.2 seconds   INR 0.97     Comment: Performed at [facility] [address], [place], [place] [address]  APTT     Status: None   Collection Time: 03/22/18  1:17 PM  Result Value Ref Range   aPTT 31 24 - 36 seconds    Comment: Performed at [facility] [address], [place], [place] [address]  CBC     Status: Abnormal   Collection Time: 03/22/18  1:17 PM  Result Value Ref Range   WBC 7.4 4.0 - 10.5 K/uL   RBC 4.30 3.87 - 5.11 MIL/uL   Hemoglobin 12.8 12.0 - 15.0 g/dL   HCT 39.1 36.0 - 46.0 %   MCV 90.9 78.0 - 100.0 fL   MCH 29.8 26.0 - 34.0 pg   MCHC 32.7 30.0 - 36.0 g/dL   RDW 15.1 11.5 - 15.5 %   Platelets 460 (H) 150 - 400 K/uL    Comment: Performed at [facility] [address] [place], [place] [address]  Differential  Status: None   Collection Time: 03/22/18  1:17 PM  Result Value Ref Range   Neutrophils Relative % 67 %   Neutro Abs 5.0 1.7 - 7.7 K/uL   Lymphocytes Relative 19 %   Lymphs Abs 1.4 0.7 - 4.0 K/uL   Monocytes Relative 12 %   Monocytes Absolute 0.9 0.1 - 1.0 K/uL   Eosinophils Relative 1 %   Eosinophils Absolute 0.1 0.0 - 0.7 K/uL   Basophils Relative 1 %   Basophils Absolute 0.1 0.0 - 0.1 K/uL   Immature Granulocytes 0 %   Abs Immature Granulocytes 0.0 0.0 - 0.1 K/uL    Comment: Performed at [facility] [address] [place], [place] [address]  Comprehensive metabolic panel     Status: Abnormal   Collection Time: 03/22/18  1:17 PM  Result Value Ref Range   Sodium 137 135 - 145 mmol/L   Potassium 3.9 3.5 - 5.1 mmol/L   Chloride 100 (L) 101 - 111 mmol/L   CO2 26 22 - 32 mmol/L   Glucose, Bld 115 (H) 65 - 99 mg/dL   BUN 8 6 - 20 mg/dL   Creatinine, Ser 0.99 0.44 - 1.00 mg/dL   Calcium 8.8 (L) 8.9 - 10.3 mg/dL   Total Protein 6.6 6.5 - 8.1 g/dL   Albumin 3.0 (L) 3.5 - 5.0 g/dL   AST 25 15 - 41 U/L   ALT 12 (L) 14 - 54 U/L   Alkaline Phosphatase 83 38 - 126 U/L   Total Bilirubin 0.5 0.3 - 1.2 mg/dL   GFR calc non Af Amer 58 (L) >60 mL/min   GFR calc Af Amer >60 >60 mL/min    Comment: (NOTE) The eGFR has been calculated using the CKD EPI equation. This calculation has not been validated in all clinical situations. eGFR's persistently <60 mL/min signify possible Chronic  Kidney Disease.    Anion gap 11 5 - 15    Comment: Performed at [facility] [address] [place], [place] [address]  Procalcitonin - Baseline     Status: None   Collection Time: 03/22/18  1:17 PM  Result Value Ref Range   Procalcitonin 0.33 ng/mL    Comment:        Interpretation: PCT (Procalcitonin) <= 0.5 ng/mL: Systemic infection (sepsis) is not likely. Local bacterial infection is possible. (NOTE)       Sepsis PCT Algorithm           Lower Respiratory Tract                                      Infection PCT Algorithm    ----------------------------     ----------------------------         PCT < 0.25 ng/mL                PCT < 0.10 ng/mL         Strongly encourage             Strongly discourage   discontinuation of antibiotics    initiation of antibiotics    ----------------------------     -----------------------------       PCT 0.25 - 0.50 ng/mL            PCT 0.10 - 0.25 ng/mL               OR       >  80% decrease in PCT            Discourage initiation of                                            antibiotics      Encourage discontinuation           of antibiotics    ----------------------------     -----------------------------         PCT >= 0.50 ng/mL              PCT 0.26 - 0.50 ng/mL               AND        <80% decrease in PCT             Encourage initiation of                                             antibiotics       Encourage continuation           of antibiotics    ----------------------------     -----------------------------        PCT >= 0.50 ng/mL                  PCT > 0.50 ng/mL               AND         increase in PCT                  Strongly encourage                                      initiation of antibiotics    Strongly encourage escalation           of antibiotics                                     -----------------------------                                           PCT <= 0.25 ng/mL                                                  OR                                        > 80% decrease in PCT                                     Discontinue / Do not initiate  antibiotics Performed at [facility], [place] [address] [place], [place] [address]   Urine rapid drug screen (hosp performed)     Status: Abnormal   Collection Time: 03/22/18  2:02 PM  Result Value Ref Range   Opiates POSITIVE (A) NONE DETECTED   Cocaine NONE DETECTED NONE DETECTED   Benzodiazepines NONE DETECTED NONE DETECTED   Amphetamines NONE DETECTED NONE DETECTED   Tetrahydrocannabinol POSITIVE (A) NONE DETECTED   Barbiturates NONE DETECTED NONE DETECTED    Comment: (NOTE) DRUG SCREEN FOR MEDICAL PURPOSES ONLY.  IF CONFIRMATION IS NEEDED FOR ANY PURPOSE, NOTIFY LAB WITHIN 5 DAYS. LOWEST DETECTABLE LIMITS FOR URINE DRUG SCREEN Drug Class                     Cutoff (ng/mL) Amphetamine and metabolites    1000 Barbiturate and metabolites    200 Benzodiazepine                 462 Tricyclics and metabolites     300 Opiates and metabolites        300 Cocaine and metabolites        300 THC                            50 Performed at [facility], [place] [address] [place], [place] [address]   Urinalysis, Routine w reflex microscopic     Status: Abnormal   Collection Time: 03/22/18  2:02 PM  Result Value Ref Range   Color, Urine YELLOW YELLOW   APPearance CLEAR CLEAR   Specific Gravity, Urine 1.038 (H) 1.005 - 1.030   pH 5.0 5.0 - 8.0   Glucose, UA NEGATIVE NEGATIVE mg/dL   Hgb urine dipstick NEGATIVE NEGATIVE   Bilirubin Urine NEGATIVE NEGATIVE   Ketones, ur NEGATIVE NEGATIVE mg/dL   Protein, ur NEGATIVE NEGATIVE mg/dL   Nitrite NEGATIVE NEGATIVE   Leukocytes, UA NEGATIVE NEGATIVE    Comment: Performed at [facility] [address], [place], [place] [address]  POCT Activated clotting time     Status: None   Collection Time: 03/22/18  3:47 PM  Result Value Ref Range    Activated Clotting Time 180 seconds  MRSA PCR Screening     Status: None   Collection Time: 03/22/18  6:21 PM  Result Value Ref Range   MRSA by PCR NEGATIVE NEGATIVE    Comment:        The GeneXpert MRSA Assay (FDA approved for NASAL specimens only), is one component of a comprehensive MRSA colonization surveillance program. It is not intended to diagnose MRSA infection nor to guide or monitor treatment for MRSA infections. Performed at [facility], [place] [address] [place],  [address]   Lipid panel     Status: None   Collection Time: 03/23/18  4:20 AM  Result Value Ref Range   Cholesterol 132 0 - 200 mg/dL   Triglycerides 124 <150 mg/dL   HDL 63 >40 mg/dL   Total CHOL/HDL Ratio 2.1 RATIO   VLDL 25 0 - 40 mg/dL   LDL Cholesterol 44 0 - 99 mg/dL    Comment:        Total Cholesterol/HDL:CHD Risk Coronary Heart Disease Risk Table                     Men   Women  1/2 Average Risk   3.4  3.3  Average Risk       5.0   4.4  2 X Average Risk   9.6   7.1  3 X Average Risk  23.4   11.0        Use the calculated Patient Ratio above and the CHD Risk Table to determine the patient's CHD Risk.        ATP III CLASSIFICATION (LDL):  <100     mg/dL   Optimal  100-129  mg/dL   Near or Above                    Optimal  130-159  mg/dL   Borderline  160-189  mg/dL   High  >190     mg/dL   Very High Performed at [facility] [address], [place], [place] [address]   Basic metabolic panel     Status: Abnormal   Collection Time: 03/23/18  4:20 AM  Result Value Ref Range   Sodium 141 135 - 145 mmol/L   Potassium 3.7 3.5 - 5.1 mmol/L   Chloride 110 101 - 111 mmol/L   CO2 22 22 - 32 mmol/L   Glucose, Bld 97 65 - 99 mg/dL   BUN <5 (L) 6 - 20 mg/dL   Creatinine, Ser 0.48 0.44 - 1.00 mg/dL   Calcium 7.7 (L) 8.9 - 10.3 mg/dL   GFR calc non Af Amer >60 >60 mL/min   GFR calc Af Amer >60 >60 mL/min    Comment: (NOTE) The eGFR has been calculated using the CKD EPI  equation. This calculation has not been validated in all clinical situations. eGFR's persistently <60 mL/min signify possible Chronic Kidney Disease.    Anion gap 9 5 - 15    Comment: Performed at [facility] [address] [place], [place] [address]  Hemoglobin A1c     Status: None   Collection Time: 03/23/18  4:25 AM  Result Value Ref Range   Hgb A1c MFr Bld 5.6 4.8 - 5.6 %    Comment: (NOTE) Pre diabetes:          5.7%-6.4% Diabetes:              >6.4% Glycemic control for   <7.0% adults with diabetes    Mean Plasma Glucose 114.02 mg/dL    Comment: Performed at [facility] [address], [place], [place] [address]  CBC with Differential/Platelet     Status: Abnormal   Collection Time: 03/23/18  4:25 AM  Result Value Ref Range   WBC 5.5 4.0 - 10.5 K/uL   RBC 3.43 (L) 3.87 - 5.11 MIL/uL   Hemoglobin 10.3 (L) 12.0 - 15.0 g/dL   HCT 31.6 (L) 36.0 - 46.0 %   MCV 92.1 78.0 - 100.0 fL   MCH 30.0 26.0 - 34.0 pg   MCHC 32.6 30.0 - 36.0 g/dL   RDW 15.2 11.5 - 15.5 %   Platelets 383 150 - 400 K/uL   Neutrophils Relative % 67 %   Neutro Abs 3.7 1.7 - 7.7 K/uL   Lymphocytes Relative 22 %   Lymphs Abs 1.2 0.7 - 4.0 K/uL   Monocytes Relative 11 %   Monocytes Absolute 0.6 0.1 - 1.0 K/uL   Eosinophils Relative 0 %   Eosinophils Absolute 0.0 0.0 - 0.7 K/uL   Basophils Relative 0 %   Basophils Absolute 0.0 0.0 - 0.1 K/uL   Immature Granulocytes 0 %  Abs Immature Granulocytes 0.0 0.0 - 0.1 K/uL    Comment: Performed at [facility], [place] [address] [place], [place] [address]   Ct Angio Head W Or Wo Contrast  Result Date: 03/22/2018 CLINICAL DATA:  Right MCA syndrome EXAM: CT ANGIOGRAPHY HEAD AND NECK CT PERFUSION BRAIN TECHNIQUE: Multidetector CT imaging of the head and neck was performed using the standard protocol during bolus administration of intravenous contrast. Multiplanar CT image reconstructions and MIPs were obtained to evaluate the vascular anatomy.  Carotid stenosis measurements (when applicable) are obtained utilizing NASCET criteria, using the distal internal carotid diameter as the denominator. Multiphase CT imaging of the brain was performed following IV bolus contrast injection. Subsequent parametric perfusion maps were calculated using RAPID software. CONTRAST:  Dose not currently available COMPARISON:  None. FINDINGS: CTA NECK FINDINGS Aortic arch: Extensive atherosclerotic plaque.  No dissection. Right carotid system: Bulky, irregular calcified plaque at the brachiocephalic origin. Prominent noncalcified plaque primarily on the anterior wall the common carotid with up to 50% stenosis. Bulky plaque at the bifurcation of the common carotid with ICA occlusion. The non-opacified vessel is difficult to distinguish and may be collapsed from chronic occlusion. Left carotid system: Diffuse atherosclerotic plaque. Proximal common carotid stenosis of 50% on coronal reformats. Bulky calcified plaque at the ICA bulb causes ~80% stenosis. Negative for ulceration. Vertebral arteries: Extensive calcified plaque along the proximal left subclavian with nonvisualized lumen in places due to degree of stenosis and calcified plaque blooming. Beyond the vertebral origin there is additional bulky calcified plaque with at least 50% stenosis before and at the rib crossing (quantification is limited given the extent of disease which makes it difficult to determine baseline diameter). The left vertebral artery is occluded at the V2 segment. There is severe proximal right vertebral stenosis due to calcified plaque, with non visible lumen. Right proximal subclavian stenosis measures up to 50%. Skeleton: No acute finding. Severe cervical spine disc and facet degeneration with multilevel listhesis and kyphotic deformity. Other neck: No acute finding. Upper chest: Emphysema Review of the MIP images confirms the above findings CTA HEAD FINDINGS Anterior circulation: Right ICA occlusion  to the level of the terminus where there is reconstitution presumably via the small P1 segment and a tiny anterior communicating artery. Right MCA branches are under filled but symmetrically opacified. Symmetric bilateral ACA flow. Atherosclerotic plaque on the left carotid siphon with mild-to-moderate narrowing. No left-sided branch occlusion is seen. Posterior circulation: Robust flow in the right vertebral artery. Presumed retrograde flow in the left vertebral artery. Both picas are enhancing. There is moderate narrowing and calcified plaque at the left V4 segment. Hypoplastic right P1 segment. Under filled right PCA branches. Venous sinuses: Not yet opacified Anatomic variants: Fetal type right PCA Delayed phase: Not obtained in the emergent setting Review of the MIP images confirms the above findings CT Brain Perfusion Findings: CBF (<30%) Volume: 47m Perfusion (Tmax>6.0s) volume: 3354mMismatch Volume: 30371mnfarction Location:The infarct by CT perfusion encompasses the low-density completed infarct by noncontrast CT. It also includes areas along the upper watershed that were not clearly seen by CT. The penumbra calculation includes ACA territory. At time of head CT call report, Dr KirLeonel Ramsays already aware of RICA occlusion. Communication of perfusion findings is underway. 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 Limited major collateral pathways-fetal type right PCA with hypoplastic right P1 segment and probable tiny anterior communicating artery. 33 cc infarct in the right  MCA/PCA distribution, which includes the area of completed infarct by noncontrast CT and additional parenchyma along the watershed. Nearly the entire right cerebral hemisphere is in the penumbra range (336 cc). Severe underlying atherosclerotic disease: *Severe proximal left subclavian stenosis. Occlusion of the non dominant left V2 segment with intracranial  reconstitution. *Severe right vertebral artery origin stenosis. *~80% proximal left ICA stenosis. Nearly 50% proximal left common carotid stenosis. *~50% stenosis at the right common carotid origin. Emphysema. Electronically Signed   By: Monte Fantasia M.D.   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 Cough and shortness of breath. Aspiration. EXAM: CHEST - 2 VIEW COMPARISON:  01/27/2016 chest radiographs and earlier. FINDINGS: Upright AP and lateral views of the chest at 1054 hours. Stable large lung volumes. No pneumothorax, pulmonary edema or consolidation. Small bilateral pleural effusions suspected. Chronic hiatal hernia suspected. Other mediastinal contours are within normal limits. Calcified aortic atherosclerosis. Visualized tracheal air column is within normal limits. Chronic lower thoracic augmented compression fracture. Partially visible right proximal humerus ORIF hardware. Stable visualized osseous structures. Negative visible bowel gas pattern. IMPRESSION: 1. Probable small bilateral pleural effusions. 2. No other acute cardiopulmonary abnormality identified. 3. Chronic hiatal hernia suspected. 4.  Aortic Atherosclerosis (ICD10-I70.0). Electronically Signed   By: Genevie Ann M.D.   On: 03/23/2018 10:52   Ct Head Wo Contrast  Addendum Date: 03/23/2018   ADDENDUM REPORT: 03/23/2018 13:12 ADDENDUM: Study discussed by telephone with Neurology Dr. Leonie Man on 03/23/2018 at 1304 hours. He advises that clinically the patient is doing well, with improved deficits since presentation and no known new or right side deficits. This argues against acute ischemia in the left hemisphere. The patient was unable to tolerate Brain MRI earlier today. Electronically Signed   By: Genevie Ann M.D.   On: 03/23/2018 13:12   Result Date: 03/23/2018 CLINICAL DATA:  67 year old female status post right ICA  large vessel occlusion. Unsuccessful revascularization. Right anterior frontal lobe, right parietal and occipital lobe, and watershed areas of core infarct suspected at presentation. EXAM: CT HEAD WITHOUT CONTRAST TECHNIQUE: Contiguous axial images were obtained from the base of the skull through the vertex without intravenous contrast. COMPARISON:  CTA and CT perfusion plus presentation noncontrast head CT 03/22/2018. FINDINGS: Brain: Mildly enlarged and progressed areas of cytotoxic edema in the right parietal and occipital lobes since the presentation CT 1304 hours yesterday (series 3, image 19). Similar mild patchy increased cytotoxic edema related hypodensity in the right superior frontal lobe (images 22-24). No associated hemorrhage or mass effect. However, at the same time there is new confluent white matter hypodensity in the left corona radiata (series 3, image 18), tracking to the deep white matter capsules. The left M2 SIL a and basal ganglia appear to remain stable. Questionable new hypodensity at the lower left thalamus (image 13). Elsewhere gray-white matter differentiation appears to remain stable. No intracranial mass effect. No intracranial hemorrhage or ventriculomegaly. Vascular: Calcified atherosclerosis at the skull base. New calcified density in the left sylvian fissure on series 3, image 13 suggesting interval left MCA branch plaque thromboembolism. Skull: No acute osseous abnormality identified. Sinuses/Orbits: Visualized paranasal sinuses and mastoids are stable and generally well pneumatized. Other: No acute orbit or scalp soft tissue findings. IMPRESSION: 1. Comparatively mild evolution of right hemisphere cytotoxic edema since 1304 hours yesterday considering the CTA/CTP findings at presentation. The right hemisphere core infarct size  has perhaps doubled since that time. 2. Questionable new white matter ischemia in the left hemisphere. And there is evidence of interval left MCA branch  thromboembolism (series 3, image 13). 3. No intracranial hemorrhage or mass effect. Electronically Signed: By: Genevie Ann M.D. On: 03/23/2018 13:01   Ct Angio Neck W Or Wo Contrast  Result Date: 03/22/2018 CLINICAL DATA:  Right MCA syndrome EXAM: CT ANGIOGRAPHY HEAD AND NECK CT PERFUSION BRAIN TECHNIQUE: Multidetector CT imaging of the head and neck was performed using the standard protocol during bolus administration of intravenous contrast. Multiplanar CT image reconstructions and MIPs were obtained to evaluate the vascular anatomy. Carotid stenosis measurements (when applicable) are obtained utilizing NASCET criteria, using the distal internal carotid diameter as the denominator. Multiphase CT imaging of the brain was performed following IV bolus contrast injection. Subsequent parametric perfusion maps were calculated using RAPID software. CONTRAST:  Dose not currently available COMPARISON:  None. FINDINGS: CTA NECK FINDINGS Aortic arch: Extensive atherosclerotic plaque.  No dissection. Right carotid system: Bulky, irregular calcified plaque at the brachiocephalic origin. Prominent noncalcified plaque primarily on the anterior wall the common carotid with up to 50% stenosis. Bulky plaque at the bifurcation of the common carotid with ICA occlusion. The non-opacified vessel is difficult to distinguish and may be collapsed from chronic occlusion. Left carotid system: Diffuse atherosclerotic plaque. Proximal common carotid stenosis of 50% on coronal reformats. Bulky calcified plaque at the ICA bulb causes ~80% stenosis. Negative for ulceration. Vertebral arteries: Extensive calcified plaque along the proximal left subclavian with nonvisualized lumen in places due to degree of stenosis and calcified plaque blooming. Beyond the vertebral origin there is additional bulky calcified plaque with at least 50% stenosis before and at the rib crossing (quantification is limited given the extent of disease which makes it  difficult to determine baseline diameter). The left vertebral artery is occluded at the V2 segment. There is severe proximal right vertebral stenosis due to calcified plaque, with non visible lumen. Right proximal subclavian stenosis measures up to 50%. Skeleton: No acute finding. Severe cervical spine disc and facet degeneration with multilevel listhesis and kyphotic deformity. Other neck: No acute finding. Upper chest: Emphysema Review of the MIP images confirms the above findings CTA HEAD FINDINGS Anterior circulation: Right ICA occlusion to the level of the terminus where there is reconstitution presumably via the small P1 segment and a tiny anterior communicating artery. Right MCA branches are under filled but symmetrically opacified. Symmetric bilateral ACA flow. Atherosclerotic plaque on the left carotid siphon with mild-to-moderate narrowing. No left-sided branch occlusion is seen. Posterior circulation: Robust flow in the right vertebral artery. Presumed retrograde flow in the left vertebral artery. Both picas are enhancing. There is moderate narrowing and calcified plaque at the left V4 segment. Hypoplastic right P1 segment. Under filled right PCA branches. Venous sinuses: Not yet opacified Anatomic variants: Fetal type right PCA Delayed phase: Not obtained in the emergent setting Review of the MIP images confirms the above findings CT Brain Perfusion Findings: CBF (<30%) Volume: 55m Perfusion (Tmax>6.0s) volume: 3355mMismatch Volume: 30378mnfarction Location:The infarct by CT perfusion encompasses the low-density completed infarct by noncontrast CT. It also includes areas along the upper watershed that were not clearly seen by CT. The penumbra calculation includes ACA territory. At time of head CT call report, Dr KirLeonel Ramsays already aware of RICA occlusion. Communication of perfusion findings is underway. 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 Limited  major collateral pathways-fetal type right PCA with hypoplastic right P1 segment and probable tiny anterior communicating artery. 33 cc infarct in the right MCA/PCA distribution, which includes the area of completed infarct by noncontrast CT and additional parenchyma along the watershed. Nearly the entire right cerebral hemisphere is in the penumbra range (336 cc). Severe underlying atherosclerotic disease: *Severe proximal left subclavian stenosis. 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 *Severe right vertebral artery origin stenosis. *~80% proximal left ICA stenosis. Nearly 50% proximal left common carotid stenosis. *~50% stenosis at the right common carotid origin. Emphysema. Electronically Signed   By: Monte Fantasia M.D.   On: 03/22/2018 13:45   Dg Hand 2 View Right  Result Date: 03/23/2018 CLINICAL DATA:  Acute onset of dorsal right hand bruising and pain. Initial encounter. EXAM: RIGHT HAND - 2 VIEW COMPARISON:  None. 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 Mild degenerative change is noted at the first carpometacarpal joint, and there is mild underlying osseous fragmentation. Diffuse soft tissue swelling is noted about the wrist and dorsum of the hand. Remaining visualized joint spaces are grossly unremarkable. There is no definite evidence of fracture. IMPRESSION: 1. 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 This is of uncertain etiology. Would correlate with the patient's history. 2. Mild degenerative change at the first carpometacarpal joint, with mild underlying osseous fragmentation. 3. Diffuse soft tissue swelling about the wrist and dorsum of  the hand. Electronically Signed   By: Garald Balding M.D.   On: 03/23/2018 22:39   [place]  Result Date: 03/24/2018 INDICATION: Severe dysarthria, left-sided hemiplegia, and speech difficulties. Abnormal CT angiogram of the head and neck and CT perfusion. EXAM: 1. EMERGENT LARGE VESSEL OCCLUSION THROMBOLYSIS (anterior CIRCULATION) COMPARISON:  CT angiogram of the head and neck of 03/22/2018. MEDICATIONS: Vancomycin 1 g antibiotic was administered within 1 hour of the procedure. ANESTHESIA/SEDATION: General anesthesia. CONTRAST:  Isovue 300 approximately 60 mL. FLUOROSCOPY TIME:  Fluoroscopy Time: 84 minutes 0 seconds (178 mGy). COMPLICATIONS: None immediate. TECHNIQUE: Following a full explanation of the procedure along with the potential associated complications, an informed witnessed consent was obtained from the patient's husband and daughter. The risks of intracranial hemorrhage of 10%, worsening neurological deficit, ventilator dependency, death and inability to revascularize were all reviewed in detail with the patient's family. The patient was then put under general anesthesia by the Department of Anesthesiology at [facility]. The left groin was prepped and draped in the usual sterile fashion. Thereafter using modified Seldinger technique, transfemoral access into the left common femoral artery was obtained without difficulty. Over a 0.035 inch guidewire a 5 French Pinnacle sheath was inserted. Through this, and also over a 0.035 inch guidewire a 5 [place] JB 1 catheter was advanced to the aortic arch region and selectively positioned in the innominate artery and the right common carotid artery. FINDINGS: The innominate artery injection demonstrates extensive calcific atherosclerotic plaque at the origin of the innominate artery and also the aortic arch. Patchy calcification is also noted in the distal right subclavian artery. The right vertebral artery demonstrates approximately 50%  stenosis at its origin. There is moderate tortuosity associated with this. Distal opacification is noted into the cranial skull base.  The visualized portion also demonstrates opacification of the right vertebrobasilar junction, the right posterior-inferior cerebellar artery. The opacified portion of the basilar artery, the superior cerebellar arteries and the anterior-cerebellar arteries is grossly intact into the delayed arterial phase. The right common carotid arteriogram demonstrates the right external carotid artery to be mild to moderately narrowed at its origin. Its branches are, otherwise, widely patent. The right internal carotid artery at the bulb demonstrates complete occlusion without evidence of a delayed string sign on the arterial phase. Thick calcified plaque is associated in this region. Intracranially there is delayed opacification of the distal cavernous and supraclinoid right ICA from the ophthalmic artery via the nasolacrimal collaterals. There is subsequent opacification of the right middle cerebral artery and the right anterior cerebral artery proximally. Opacification is noted into the trifurcation branches of the right middle cerebral artery. Mixing of non-opacified blood is seen in the proximal right middle cerebral artery from the contralateral internal carotid artery via the anterior communicating artery. The left common carotid arteriogram demonstrates the left external carotid artery and its major branches to be widely patent. The left internal carotid artery at the bulb demonstrates approximately 75-80% stenosis by the NASCET criteria. Distal to this, the vessel is seen to opacify widely to the cranial skull base. The petrous, cavernous and supraclinoid segments demonstrate wide patency. The left middle cerebral artery and the left anterior cerebral artery opacify into the capillary and venous phases. There is prompt cross filling via the anterior communicating artery of the left  anterior cerebral artery A2 segment, A1 segment, and also the right middle cerebral artery distribution. Again noted is mixing with unopacified blood from the right anterior circulation as described above. PROCEDURE: ENDOVASCULAR REVASCULARIZATION OF OCCLUDED RIGHT INTERNAL CAROTID ARTERY. The diagnostic JB 1 catheter in the right common carotid artery was then exchanged over a 0.035 inch 300 cm Rosen exchange guidewire for a 55 cm 8 French Brite tip neurovascular sheath using biplane roadmap technique and constant fluoroscopic guidance. Good aspiration obtained from the side port of the neurovascular sheath. This was then connected to continuous heparinized saline infusion. Over the [facility] exchange guidewire, an 85 cm 8 [place] FlowGate balloon guide catheter which been prepped with 50% contrast and 50% heparinized saline infusion was advanced and positioned just proximal to the right common carotid bifurcation. The guidewire was removed. Good aspiration obtained from the hub of the [facility] guide catheter. Gentle contrast injection demonstrated no evidence of spasms, dissections or of intraluminal filling defects. At this time, over a 0.014 inch regular Transend EX micro guidewire, a Trevo ProVue 021 microcatheter was advanced just distal to the tip of the [place] guide catheter. Using a torque device, attempts were made to advance the micro guidewire and the microcatheter through the occluded right internal carotid artery without success. This was then replaced with a 5 French slip catheter, and a 4 [place] diagnostic JB 1 catheter. With each of these, using a 0.035 inch Roadrunner guidewire, access through the calcified atherosclerotic plaque could not be achieved. This was despite the advancement of the diagnostic catheters into the occluded right internal carotid artery bulb. A final attempt was then made using a Trevo ProVue 021 microcatheter with a regular Transend EX micro guidewire. The wire could only be  advanced to the proximal 2/3 of the plaque as could the microcatheter. Despite multiple attempts, the more distal access into right internal carotid artery was unsuccessful. During this time, the patient was also given 3 mg of intra-arterial Integrilin into  the occluded right internal carotid artery. During this time, the patient's neurological status and hemodynamic status remained stable with the blood pressure being maintained in the region of approximately 140 to 150 mm of mercury systolic. The 8 [place] FlowGate guide catheter and the neurovascular sheath were then retrieved into the abdominal aorta and exchanged over a J-tip guidewire for an 8 [place] Pinnacle sheath. This in turn was then removed with successful hemostasis being achieved over the left common femoral puncture site with manual compression over about 20 minutes. At the end of this, the patient's left groin was soft without evidence of a hematoma. Distal pulses are were again Dopplerable in the posterior tibials, and the dorsalis pedis arteries bilaterally. A Dyna CT of the brain obtained revealed no evidence of hemorrhage or mass-effect or midline shift. The ventricles remain in the midline. The patient's general anesthesia was then reversed. The patient was extubated without difficulty. Upon recovery the patient remained appropriately responsive being able to lift her left hand against gravity. She was then transported to the PACU and then to neuro ICU for subsequent management. IMPRESSION: Attempted endovascular revascularization of symptomatic occluded right internal carotid artery at the bulb as described. PLAN: Patient was transferred to neuro PACU and neuro ICU for further management. Electronically Signed   By: Luanne Bras M.D.   On: 03/23/2018 10:31   Ct Cerebral Perfusion W Contrast  Result Date: 03/22/2018 CLINICAL DATA:  Right MCA syndrome EXAM: CT ANGIOGRAPHY HEAD AND NECK CT PERFUSION BRAIN TECHNIQUE: Multidetector CT  imaging of the head and neck was performed using the standard protocol during bolus administration of intravenous contrast. Multiplanar CT image reconstructions and MIPs were obtained to evaluate the vascular anatomy. Carotid stenosis measurements (when applicable) are obtained utilizing NASCET criteria, using the distal internal carotid diameter as the denominator. Multiphase CT imaging of the brain was performed following IV bolus contrast injection. Subsequent parametric perfusion maps were calculated using RAPID software. CONTRAST:  Dose not currently available COMPARISON:  None. FINDINGS: CTA NECK FINDINGS Aortic arch: Extensive atherosclerotic plaque.  No dissection. Right carotid system: Bulky, irregular calcified plaque at the brachiocephalic origin. Prominent noncalcified plaque primarily on the anterior wall the common carotid with up to 50% stenosis. Bulky plaque at the bifurcation of the common carotid with ICA occlusion. The non-opacified vessel is difficult to distinguish and may be collapsed from chronic occlusion. Left carotid system: Diffuse atherosclerotic plaque. Proximal common carotid stenosis of 50% on coronal reformats. Bulky calcified plaque at the ICA bulb causes ~80% stenosis. Negative for ulceration. Vertebral arteries: Extensive calcified plaque along the proximal left subclavian with nonvisualized lumen in places due to degree of stenosis and calcified plaque blooming. Beyond the vertebral origin there is additional bulky calcified plaque with at least 50% stenosis before and at the rib crossing (quantification is limited given the extent of disease which makes it difficult to determine baseline diameter). The left vertebral artery is occluded at the V2 segment. There is severe proximal right vertebral stenosis due to calcified plaque, with non visible lumen. Right proximal subclavian stenosis measures up to 50%. Skeleton: No acute finding. Severe cervical spine disc and facet  degeneration with multilevel listhesis and kyphotic deformity. Other neck: No acute finding. Upper chest: Emphysema Review of the MIP images confirms the above findings CTA HEAD FINDINGS Anterior circulation: Right ICA occlusion to the level of the terminus where there is reconstitution presumably via the small P1 segment and a tiny anterior communicating artery. Right MCA branches are under  filled but symmetrically opacified. Symmetric bilateral ACA flow. Atherosclerotic plaque on the left carotid siphon with mild-to-moderate narrowing. No left-sided branch occlusion is seen. Posterior circulation: Robust flow in the right vertebral artery. Presumed retrograde flow in the left vertebral artery. Both picas are enhancing. There is moderate narrowing and calcified plaque at the left V4 segment. Hypoplastic right P1 segment. Under filled right PCA branches. Venous sinuses: Not yet opacified Anatomic variants: Fetal type right PCA Delayed phase: Not obtained in the emergent setting Review of the MIP images confirms the above findings CT Brain Perfusion Findings: CBF (<30%) Volume: 71m Perfusion (Tmax>6.0s) volume: 3337mMismatch Volume: 30374mnfarction Location:The infarct by CT perfusion encompasses the low-density completed infarct by noncontrast CT. It also includes areas along the upper watershed that were not clearly seen by CT. The penumbra calculation includes ACA territory. At time of head CT call report, Dr KirLeonel Ramsays already aware of RICA occlusion. Communication of perfusion findings is underway. 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 Limited major collateral pathways-fetal type right PCA with hypoplastic right P1 segment and probable tiny anterior communicating artery. 33 cc infarct in the right MCA/PCA distribution, which includes the area of completed infarct by noncontrast CT and additional parenchyma along the watershed.  Nearly the entire right cerebral hemisphere is in the penumbra range (336 cc). Severe underlying atherosclerotic disease: *Severe proximal left subclavian stenosis. 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 *Severe right vertebral artery origin stenosis. *~80% proximal left ICA stenosis. Nearly 50% proximal left common carotid stenosis. *~50% stenosis at the right common carotid origin. Emphysema. Electronically Signed   By: JonMonte FantasiaD.   On: 03/22/2018 13:45   Ir Percutaneous Art Thrombectomy/infusion Intracranial Inc Diag Angio  Result Date: 03/24/2018 INDICATION: Severe dysarthria, left-sided hemiplegia, and speech difficulties. Abnormal CT angiogram of the head and neck and CT perfusion. EXAM: 1. EMERGENT LARGE VESSEL OCCLUSION THROMBOLYSIS (anterior CIRCULATION) COMPARISON:  CT angiogram of the head and neck of 03/22/2018. MEDICATIONS: Vancomycin 1 g antibiotic was administered within 1 hour of the procedure. ANESTHESIA/SEDATION: General anesthesia. CONTRAST:  Isovue 300 approximately 60 mL. FLUOROSCOPY TIME:  Fluoroscopy Time: 84 minutes 0 seconds (178 mGy). COMPLICATIONS: None immediate. TECHNIQUE: Following a full explanation of the procedure along with the potential associated complications, an informed witnessed consent was obtained from the patient's husband and daughter. The risks of intracranial hemorrhage of 10%, worsening neurological deficit, ventilator dependency, death and inability to revascularize were all reviewed in detail with the patient's family. The patient was then put under general [facility] and draped in the usual sterile fashion. Thereafter using modified Seldinger technique, transfemoral access into the left common femoral artery was obtained without difficulty. Over a 0.035 inch guidewire a 5 French Pinnacle sheath was inserted. Through this, and  also over a 0.035 inch guidewire a 5 [place] 1 catheter was advanced to the aortic arch region and selectively positioned in the innominate artery and the right common carotid artery. FINDINGS: The innominate artery injection demonstrates extensive calcific atherosclerotic plaque at the origin of the innominate artery and also the aortic arch. Patchy calcification is also noted in the distal right subclavian artery. The right vertebral artery demonstrates approximately 50% stenosis at its origin. There is moderate tortuosity associated with this. Distal opacification is noted into the cranial skull base. The visualized portion also demonstrates opacification  of the right vertebrobasilar junction, the right posterior-inferior cerebellar artery. The opacified portion of the basilar artery, the superior cerebellar arteries and the anterior-cerebellar arteries is grossly intact into the delayed arterial phase. The right common carotid arteriogram demonstrates the right external carotid artery to be mild to moderately narrowed at its origin. Its branches are, otherwise, widely patent. The right internal carotid artery at the bulb demonstrates complete occlusion without evidence of a delayed string sign on the arterial phase. Thick calcified plaque is associated in this region. Intracranially there is delayed opacification of the distal cavernous and supraclinoid right ICA from the ophthalmic artery via the nasolacrimal collaterals. There is subsequent opacification of the right middle cerebral artery and the right anterior cerebral artery proximally. Opacification is noted into the trifurcation branches of the right middle cerebral artery. Mixing of non-opacified blood is seen in the proximal right middle cerebral artery from the contralateral internal carotid artery via the anterior communicating artery. The left common carotid arteriogram demonstrates the left external carotid artery and its major branches to be  widely patent. The left internal carotid artery at the bulb demonstrates approximately 75-80% stenosis by the NASCET criteria. Distal to this, the vessel is seen to opacify widely to the cranial skull base. The petrous, cavernous and supraclinoid segments demonstrate wide patency. The left middle cerebral artery and the left anterior cerebral artery opacify into the capillary and venous phases. There is prompt cross filling via the anterior communicating artery of the left anterior cerebral artery A2 segment, A1 segment, and also the right middle cerebral artery distribution. Again noted is mixing with unopacified blood from the right anterior circulation as described above. PROCEDURE: ENDOVASCULAR REVASCULARIZATION OF OCCLUDED RIGHT INTERNAL CAROTID ARTERY. The diagnostic JB 1 catheter in the right common carotid artery was then exchanged over a 0.035 inch 300 cm Rosen exchange guidewire for a 55 cm 8 French Brite tip neurovascular sheath using biplane roadmap technique and constant fluoroscopic guidance. Good aspiration obtained from the side port of the neurovascular sheath. This was then connected to continuous heparinized saline infusion. Over the [facility] exchange guidewire, an 85 cm 8 [place] FlowGate balloon guide catheter which been prepped with 50% contrast and 50% heparinized saline infusion was advanced and positioned just proximal to the right common carotid bifurcation. The guidewire was removed. Good aspiration obtained from the hub of the [facility] guide catheter. Gentle contrast injection demonstrated no evidence of spasms, dissections or of intraluminal filling defects. At this time, over a 0.014 inch regular Transend EX micro guidewire, a Trevo ProVue 021 microcatheter was advanced just distal to the tip of the [facility] guide catheter. Using a torque device, attempts were made to advance the micro guidewire and the microcatheter through the occluded right internal carotid artery without success. This  was then replaced with a 5 French slip catheter, and a 4 [place] diagnostic JB 1 catheter. With each of these, using a 0.035 inch Roadrunner guidewire, access through the calcified atherosclerotic plaque could not be achieved. This was despite the advancement of the diagnostic catheters into the occluded right internal carotid artery bulb. A final attempt was then made using a Trevo ProVue 021 microcatheter with a regular Transend EX micro guidewire. The wire could only be advanced to the proximal 2/3 of the plaque as could the microcatheter. Despite multiple attempts, the more distal access into right internal carotid artery was unsuccessful. During this time, the patient was also given 3 mg of intra-arterial Integrilin into the occluded right internal carotid artery.  During this time, the patient's neurological status and hemodynamic status remained stable with the blood pressure being maintained in the region of approximately 140 to 150 mm of mercury systolic. The 8 [place] FlowGate guide catheter and the neurovascular sheath were then retrieved into the abdominal aorta and exchanged over a J-tip guidewire for an 8 [place] Pinnacle sheath. This in turn was then removed with successful hemostasis being achieved over the left common femoral puncture site with manual compression over about 20 minutes. At the end of this, the patient's left groin was soft without evidence of a hematoma. Distal pulses are were again Dopplerable in the posterior tibials, and the dorsalis pedis arteries bilaterally. A Dyna CT of the brain obtained revealed no evidence of hemorrhage or mass-effect or midline shift. The ventricles remain in the midline. The patient's general anesthesia was then reversed. The patient was extubated without difficulty. Upon recovery the patient remained appropriately responsive being able to lift her left hand against gravity. She was then transported to the PACU and then to neuro ICU for subsequent management.  IMPRESSION: Attempted endovascular revascularization of symptomatic occluded right internal carotid artery at the bulb as described. PLAN: Patient was transferred to neuro PACU and neuro ICU for further management. Electronically Signed   By: Luanne Bras M.D.   On: 03/23/2018 10:31   Ir Angio Intra Extracran Sel Com Carotid Innominate Bilat Mod Sed  Result Date: 03/24/2018 INDICATION: Severe dysarthria, left-sided hemiplegia, and speech difficulties. Abnormal CT angiogram of the head and neck and CT perfusion. EXAM: 1. EMERGENT LARGE VESSEL OCCLUSION THROMBOLYSIS (anterior CIRCULATION) COMPARISON:  CT angiogram of the head and neck of 03/22/2018. MEDICATIONS: Vancomycin 1 g antibiotic was administered within 1 hour of the procedure. ANESTHESIA/SEDATION: General anesthesia. CONTRAST:  Isovue 300 approximately 60 mL. FLUOROSCOPY TIME:  Fluoroscopy Time: 84 minutes 0 seconds (178 mGy). COMPLICATIONS: None immediate. TECHNIQUE: Following a full explanation of the procedure along with the potential associated complications, an informed witnessed consent was obtained from the patient's husband and daughter. The risks of intracranial hemorrhage of 10%, worsening neurological deficit, ventilator dependency, death and inability to revascularize were all reviewed in detail with the patient's family. The patient was then put under general anesthesia by the Department of Anesthesiology at [facility]. The left groin was prepped and draped in the usual sterile fashion. Thereafter using modified Seldinger technique, transfemoral access into the left common femoral artery was obtained without difficulty. Over a 0.035 inch guidewire a 5 French Pinnacle sheath was inserted. Through this, and also over a 0.035 inch guidewire a 5 [place] JB 1 catheter was advanced to the aortic arch region and selectively positioned in the innominate artery and the right common carotid artery. FINDINGS: The innominate artery  injection demonstrates extensive calcific atherosclerotic plaque at the origin of the innominate artery and also the aortic arch. Patchy calcification is also noted in the distal right subclavian artery. The right vertebral artery demonstrates approximately 50% stenosis at its origin. There is moderate tortuosity associated with this. Distal opacification is noted into the cranial skull base. The visualized portion also demonstrates opacification of the right vertebrobasilar junction, the right posterior-inferior cerebellar artery. The opacified portion of the basilar artery, the superior cerebellar arteries and the anterior-cerebellar arteries is grossly intact into the delayed arterial phase. The right common carotid arteriogram demonstrates the right external carotid artery to be mild to moderately narrowed at its origin. Its branches are, otherwise, widely patent. The right internal carotid artery at the bulb demonstrates  complete occlusion without evidence of a delayed string sign on the arterial phase. Thick calcified plaque is associated in this region. Intracranially there is delayed opacification of the distal cavernous and supraclinoid right ICA from the ophthalmic artery via the nasolacrimal collaterals. There is subsequent opacification of the right middle cerebral artery and the right anterior cerebral artery proximally. Opacification is noted into the trifurcation branches of the right middle cerebral artery. Mixing of non-opacified blood is seen in the proximal right middle cerebral artery from the contralateral internal carotid artery via the anterior communicating artery. The left common carotid arteriogram demonstrates the left external carotid artery and its major branches to be widely patent. The left internal carotid artery at the bulb demonstrates approximately 75-80% stenosis by the NASCET criteria. Distal to this, the vessel is seen to opacify widely to the cranial skull base. The petrous,  cavernous and supraclinoid segments demonstrate wide patency. The left middle cerebral artery and the left anterior cerebral artery opacify into the capillary and venous phases. There is prompt cross filling via the anterior communicating artery of the left anterior cerebral artery A2 segment, A1 segment, and also the right middle cerebral artery distribution. Again noted is mixing with unopacified blood from the right anterior circulation as described above. PROCEDURE: ENDOVASCULAR REVASCULARIZATION OF OCCLUDED RIGHT INTERNAL CAROTID ARTERY. The diagnostic JB 1 catheter in the right common carotid artery was then exchanged over a 0.035 inch 300 cm Rosen exchange guidewire for a 55 cm 8 French Brite tip neurovascular sheath using biplane roadmap technique and constant fluoroscopic guidance. Good aspiration obtained from the side port of the neurovascular sheath. This was then connected to continuous heparinized saline infusion. Over the [facility] exchange guidewire, an 85 cm 8 [place] FlowGate balloon guide catheter which been prepped with 50% contrast and 50% heparinized saline infusion was advanced and positioned just proximal to the right common carotid bifurcation. The guidewire was removed. Good aspiration obtained from the hub of the [facility] guide catheter. Gentle contrast injection demonstrated no evidence of spasms, dissections or of intraluminal filling defects. At this time, over a 0.014 inch regular Transend EX micro guidewire, a Trevo ProVue 021 microcatheter was advanced just distal to the tip of the [facility] guide catheter. Using a torque device, attempts were made to advance the micro guidewire and the microcatheter through the occluded right internal carotid artery without success. This was then replaced with a 5 French slip catheter, and a 4 [place] diagnostic JB 1 catheter. With each of these, using a 0.035 inch Roadrunner guidewire, access through the calcified atherosclerotic plaque could not be  achieved. This was despite the advancement of the diagnostic catheters into the occluded right internal carotid artery bulb. A final attempt was then made using a Trevo ProVue 021 microcatheter with a regular Transend EX micro guidewire. The wire could only be advanced to the proximal 2/3 of the plaque as could the microcatheter. Despite multiple attempts, the more distal access into right internal carotid artery was unsuccessful. During this time, the patient was also given 3 mg of intra-arterial Integrilin into the occluded right internal carotid artery. During this time, the patient's neurological status and hemodynamic status remained stable with the blood pressure being maintained in the region of approximately 140 to 150 mm of mercury systolic. The 8 [place] FlowGate guide catheter and the neurovascular sheath were then retrieved into the abdominal aorta and exchanged over a J-tip guidewire for an 8 [place] Pinnacle sheath. This in turn was then removed with successful hemostasis  being achieved over the left common femoral puncture site with manual compression over about 20 minutes. At the end of this, the patient's left groin was soft without evidence of a hematoma. Distal pulses are were again Dopplerable in the posterior tibials, and the dorsalis pedis arteries bilaterally. A Dyna CT of the brain obtained revealed no evidence of hemorrhage or mass-effect or midline shift. The ventricles remain in the midline. The patient's general anesthesia was then reversed. The patient was extubated without difficulty. Upon recovery the patient remained appropriately responsive being able to lift her left hand against gravity. She was then transported to the PACU and then to neuro ICU for subsequent management. IMPRESSION: Attempted endovascular revascularization of symptomatic occluded right internal carotid artery at the bulb as described. PLAN: Patient was transferred to neuro PACU and neuro ICU for further management.  Electronically Signed   By: Luanne Bras M.D.   On: 03/23/2018 10:31   Ir Angio Vertebral Sel Vertebral Uni R Mod Sed  Result Date: 03/24/2018 INDICATION: Severe dysarthria, left-sided hemiplegia, and speech difficulties. Abnormal CT angiogram of the head and neck and CT perfusion. EXAM: 1. EMERGENT LARGE VESSEL OCCLUSION THROMBOLYSIS (anterior CIRCULATION) COMPARISON:  CT angiogram of the head and neck of 03/22/2018. MEDICATIONS: Vancomycin 1 g antibiotic was administered within 1 hour of the procedure. ANESTHESIA/SEDATION: General anesthesia. CONTRAST:  Isovue 300 approximately 60 mL. FLUOROSCOPY TIME:  Fluoroscopy Time: 84 minutes 0 seconds (178 mGy). COMPLICATIONS: None immediate. TECHNIQUE: Following a full explanation of the procedure along with the potential associated complications, an informed witnessed consent was obtained from the patient's husband and daughter. The risks of intracranial hemorrhage of 10%, worsening neurological deficit, ventilator dependency, death and inability to revascularize were all reviewed in detail with the patient's family. The patient was then put under general anesthesia by the Department of Anesthesiology at [facility]. The left groin was prepped and draped in the usual sterile fashion. Thereafter using modified Seldinger technique, transfemoral access into the left common femoral artery was obtained without difficulty. Over a 0.035 inch guidewire a 5 French Pinnacle sheath was inserted. Through this, and also over a 0.035 inch guidewire a 5 [place] JB 1 catheter was advanced to the aortic arch region and selectively positioned in the innominate artery and the right common carotid artery. FINDINGS: The innominate artery injection demonstrates extensive calcific atherosclerotic plaque at the origin of the innominate artery and also the aortic arch. Patchy calcification is also noted in the distal right subclavian artery. The right vertebral artery  demonstrates approximately 50% stenosis at its origin. There is moderate tortuosity associated with this. Distal opacification is noted into the cranial skull base. The visualized portion also demonstrates opacification of the right vertebrobasilar junction, the right posterior-inferior cerebellar artery. The opacified portion of the basilar artery, the superior cerebellar arteries and the anterior-cerebellar arteries is grossly intact into the delayed arterial phase. The right common carotid arteriogram demonstrates the right external carotid artery to be mild to moderately narrowed at its origin. Its branches are, otherwise, widely patent. The right internal carotid artery at the bulb demonstrates complete occlusion without evidence of a delayed string sign on the arterial phase. Thick calcified plaque is associated in this region. Intracranially there is delayed opacification of the distal cavernous and supraclinoid right ICA from the ophthalmic artery via the nasolacrimal collaterals. There is subsequent opacification of the right middle cerebral artery and the right anterior cerebral artery proximally. Opacification is noted into the trifurcation branches of the right middle  cerebral artery. Mixing of non-opacified blood is seen in the proximal right middle cerebral artery from the contralateral internal carotid artery via the anterior communicating artery. The left common carotid arteriogram demonstrates the left external carotid artery and its major branches to be widely patent. The left internal carotid artery at the bulb demonstrates approximately 75-80% stenosis by the NASCET criteria. Distal to this, the vessel is seen to opacify widely to the cranial skull base. The petrous, cavernous and supraclinoid segments demonstrate wide patency. The left middle cerebral artery and the left anterior cerebral artery opacify into the capillary and venous phases. There is prompt cross filling via the anterior  communicating artery of the left anterior cerebral artery A2 segment, A1 segment, and also the right middle cerebral artery distribution. Again noted is mixing with unopacified blood from the right anterior circulation as described above. PROCEDURE: ENDOVASCULAR REVASCULARIZATION OF OCCLUDED RIGHT INTERNAL CAROTID ARTERY. The diagnostic JB 1 catheter in the right common carotid artery was then exchanged over a 0.035 inch 300 cm Rosen exchange guidewire for a 55 cm 8 French Brite tip neurovascular sheath using biplane roadmap technique and constant fluoroscopic guidance. Good aspiration obtained from the side port of the neurovascular sheath. This was then connected to continuous heparinized saline infusion. Over the [facility] exchange guidewire, an 85 cm 8 [place] FlowGate balloon guide catheter which been prepped with 50% contrast and 50% heparinized saline infusion was advanced and positioned just proximal to the right common carotid bifurcation. The guidewire was removed. Good aspiration obtained from the hub of the [facility] guide catheter. Gentle contrast injection demonstrated no evidence of spasms, dissections or of intraluminal filling defects. At this time, over a 0.014 inch regular Transend EX micro guidewire, a Trevo ProVue 021 microcatheter was advanced just distal to the tip of the [facility] guide catheter. Using a torque device, attempts were made to advance the micro guidewire and the microcatheter through the occluded right internal carotid artery without success. This was then replaced with a 5 French slip catheter, and a 4 [place] diagnostic JB 1 catheter. With each of these, using a 0.035 inch Roadrunner guidewire, access through the calcified atherosclerotic plaque could not be achieved. This was despite the advancement of the diagnostic catheters into the occluded right internal carotid artery bulb. A final attempt was then made using a Trevo ProVue 021 microcatheter with a regular Transend EX micro  guidewire. The wire could only be advanced to the proximal 2/3 of the plaque as could the microcatheter. Despite multiple attempts, the more distal access into right internal carotid artery was unsuccessful. During this time, the patient was also given 3 mg of intra-arterial Integrilin into the occluded right internal carotid artery. During this time, the patient's neurological status and hemodynamic status remained stable with the blood pressure being maintained in the region of approximately 140 to 150 mm of mercury systolic. The 8 [place] FlowGate guide catheter and the neurovascular sheath were then retrieved into the abdominal aorta and exchanged over a J-tip guidewire for an 8 [place] Pinnacle sheath. This in turn was then removed with successful hemostasis being achieved over the left common femoral puncture site with manual compression over about 20 minutes. At the end of this, the patient's left groin was soft without evidence of a hematoma. Distal pulses are were again Dopplerable in the posterior tibials, and the dorsalis pedis arteries bilaterally. A Dyna CT of the brain obtained revealed no evidence of hemorrhage or mass-effect or midline shift. The ventricles remain in the  midline. The patient's general anesthesia was then reversed. The patient was extubated without difficulty. Upon recovery the patient remained appropriately responsive being able to lift her left hand against gravity. She was then transported to the PACU and then to neuro ICU for subsequent management. IMPRESSION: Attempted endovascular revascularization of symptomatic occluded right internal carotid artery at the bulb as described. PLAN: Patient was transferred to neuro PACU and neuro ICU for further management. Electronically Signed   By: Luanne Bras M.D.   On: 03/23/2018 10:31       Medical Problem List and Plan: 1.  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  DVT Prophylaxis/Anticoagulation: Pharmaceutical: Lovenox 3. Chronic LBP pain/Pain Management: Was on Oxycontin 20 mg tid/ Lyrica tid with hydrocodone qid prn. Will resume OxyContin at 10 mg bid and monitor for tolerance. Continue hydrocodone prn 4. Mood: LCSW to follow for evaluation and support.  5. Neuropsych: This patient is capable of making decisions on her own behalf. 6. Skin/Wound Care: routine pressure relief measures.  7. Fluids/Electrolytes/Nutrition: Monitor I/O. Check lytes in am.  8. COPD: Last exacerbation 12/2017.  Continue Dulera bid, Singulair and spiriva 9. HTN: Monitor BP bid. 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 10. 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 Tobacco abuse: Continue to encourage cessation. Add nicotine patch.  12. H/o anxiety disorder: On Remeron and Zoloft daily.  13. Advanced OA right wrist: Continue splint with local measures.    Post Admission Physician Evaluation: 1. Functional deficits secondary  to right occipital lobe infarct. 2. Patient is admitted to receive collaborative, interdisciplinary care between the physiatrist, rehab nursing staff, and therapy team. 3. Patient's level of medical complexity and substantial therapy needs in context of that medical necessity cannot be provided at a lesser intensity of care such as a SNF. 4. Patient has experienced substantial functional loss from his/her baseline which was documented above under the "Functional History" and "Functional Status" headings.  Judging by the patient's diagnosis, physical exam, and functional history, the patient has potential for functional progress which will result in measurable gains while on inpatient rehab.  These gains will be of substantial and practical use upon discharge  in facilitating mobility and self-care at the household level. 5. Physiatrist will provide 24 hour management of medical needs as well as oversight of the therapy plan/treatment and provide guidance  as appropriate regarding the interaction of the two. 6. The Preadmission Screening has been reviewed and patient status is unchanged unless otherwise stated above. 7. 24 hour rehab nursing will assist with bladder management, bowel management, safety, skin/wound care, disease management, medication administration, pain management and patient education  and help integrate therapy concepts, techniques,education, etc. 8. PT will assess and treat for/with: Lower extremity strength, range of motion, stamina, balance, functional mobility, safety, adaptive techniques and equipment, NMR, visual-spatial awareness, family education.   Goals are: mod I to supervision. 9. OT will assess and treat for/with: .ADL's, functional mobility, safety, upper extremity strength, adaptive techniques and equipment, NMR, visual-spatial awareness, community reentry, family ed.   Goals are: mod I to supervision. Therapy may proceed with showering this patient. 10. SLP will assess and treat for/with: speech, cognition, communication.  Goals are: mod I. 11. Case Management and Social Worker will assess and treat for psychological issues and discharge planning. 12. Team conference will be held weekly to assess progress toward goals and to determine barriers to discharge. 13. Patient  will receive at least 3 hours of therapy per day at least 5 days per week. 14. ELOS: 8-11 days       15. Prognosis:  excellent   I have personally performed a face to face diagnostic evaluation of this patient and formulated the key components of the plan.  Additionally, I have personally reviewed laboratory data, imaging studies, as well as relevant notes and concur with the physician assistant's documentation above.  Meredith Staggers, MD, Mellody Drown   Bary Leriche, PA-C 03/24/2018

## 2018-03-24 NOTE — Progress Notes (Signed)
Pt admitted to unit at 1550 with family at bedside. Rn educated pt and family on rehab process, schedule and safety plan. Pt A&O x 4 with memory deficits evident. Call bell within reach, bed alarm in on.

## 2018-03-24 NOTE — Plan of Care (Signed)
Pt  is continent of B/B Skin has ecchymosis on all extremities and skin tear on left elbow Pt ambulates with rolling walker and min assist Pt's pain controlled with current plan of care

## 2018-03-24 NOTE — Progress Notes (Signed)
  Speech Language Pathology Treatment: Dysphagia  Patient Details Name: Pam Johnson MRN: [id number] DOB: 1951/03/12 Today's Date: 03/24/2018 Time: 1137-1200 SLP Time Calculation (min) (ACUTE ONLY): 23 min  Assessment / Plan / Recommendation Clinical Impression  ST follow up for therapeutic diet tolerance and possible diet advancement.  Chart review indicated that the patient's lungs have been clear but diminished and she is afebrile.  The patient reported history of nissen fundoplication as well as hernia that cause her issues when eating with food not wanting to go down.   She stated she gets full quickly and tends to eat smaller, more frequent meals.  Patient now has her dentures which are very poorly fitting.  Oral mechanism exam was completed and remarkable only for lingual deviation to the right with movement.  Strength of structures was adequate.   The patient was trialed on dry solids and appeared to do well.  Mastication did not seem labored and oral residue was not seen.  Overt s/s of aspiration were not seen despite thorough challenging.  Discussed advancing diet to regular solids and the patient reported that she has issues cutting things.  Decision was made to advance diet to mechanical soft and continue with thin liquids.  ST will follow briefly during acute stay.     HPI HPI: 67 y.o. female stenting as code stroke with left-sided deficits including left arm flaccidity, left leg weakness, left facial droop, left-sided neglect.  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  Patient was brought to interventional radiology. briefly intubated for procedure. MRI is pending. Pt reports a history of globus, GERD and smoking. She always wears her dentures to eat, but also has ulcers on her gums and has to put a special substance on her denture to treat this and does not currently have it.       SLP Plan  Continue  with current plan of care       Recommendations  Diet recommendations: Dysphagia 3 (mechanical soft);Thin liquid Liquids provided via: Cup;Straw Medication Administration: Whole meds with liquid Supervision: Patient able to self feed Compensations: Slow rate;Small sips/bites Postural Changes and/or Swallow Maneuvers: Seated upright 90 degrees;Upright 30-60 min after meal                Oral Care Recommendations: Oral care BID Follow up Recommendations: None(for dysphagia) SLP Visit Diagnosis: Dysphagia, unspecified (R13.10) Plan: Continue with current plan of care       GO               Dimas Aguas, MA, CCC-SLP Acute Rehab SLP [phone]  [name] 03/24/2018, 12:22 PM

## 2018-03-24 NOTE — Progress Notes (Signed)
*  PRELIMINARY RESULTS* Echocardiogram 2D Echocardiogram has been performed.  Pam Johnson 03/24/2018, 10:24 AM

## 2018-03-24 NOTE — PMR Pre-admission (Signed)
PMR Admission Coordinator Pre-Admission Assessment  Patient: Pam Johnson is an 67 y.o., female MRN: [id number] DOB: 1951-10-04 Height:  (162.6 cm) Weight: 51.4 kg (113 lb 5.1 oz)              Insurance Information HMO:     PPO:      PCP:      IPA:      80/20:      OTHER:  PRIMARY: Medicare A & B      Policy#: [id number]      Subscriber: Self CM Name:       Phone#:      Fax#:  Pre-Cert#: Eligible       Employer: Retired Benefits:  Phone #: Verified online     Name: [employer]  Eff. Date: A:10/25/1988 B:02/23/2008     Deduct: $1364      Out of Pocket Max: N/A      Life Max: N/A CIR: 100%      SNF: 100% days 1-20; 80% days 21-100 Outpatient: 80%     Co-Pay: 20% Home Health: 100%      Co-Pay: $0 DME: 80%     Co-Pay: 20% Providers: Patient's Choice   SECONDARY: None        Medicaid Application Date:       Case Manager:  Disability Application Date:       Case Worker:   Emergency Contact Information Contact Information    Name Relation Home Work Paw Paw Jr.,Huel Spouse   [phone]   Merian Capron Daughter   [phone]   Willa Frater   [phone]     Current Medical History  Patient Admitting Diagnosis: Right occipital lobe infarct  History of Present Illness: Pam Johnson is a 67 y.o. right-handed female with history of spinal stenosis, tobacco abuse, hypertension.  Presented 03/22/2018 with left-sided weakness and facial droop.  Husband had reported a fall last week that was unwitnessed.  Per chart review patient lives with spouse.  Two-level home with bedroom on main and one-step to entry.  She uses a straight point cane prior to admission.  Husband can assist as needed.  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  CT angiogram of head and neck showed severe proximal left subclavian stenosis.  Severe right vertebral artery origin stenosis.  80% proximal left ICA  stenosis.  Nearly 50% proximal left common carotid stenosis.  CT cerebral perfusion scan again with right ICA occlusion.  Infarct right MCA/PCA distribution.  Patient did not receive TPA.  Echocardiogram done.  Interventional radiology consulted for plan of left ICA stenting outpatient.  Currently maintained on aspirin and Plavix for CVA prophylaxis.  Subcutaneous heparin for DVT prophylaxis.  Dysphasia 3 textures and thin liquids.  Physical and occupational therapy evaluations completed with recommendations of physical medicine rehab consult patient admitted to Inpatient Rehabilitation 03/24/18.  NIH Total: 4    Past Medical History  Past Medical History:  Diagnosis Date  . Acid reflux   . COPD (chronic obstructive pulmonary disease) (HCC)   . Hypertension   . Sacral fracture (HCC)   . Spinal stenosis     Family History  family history includes Hypertension in her father and mother.  Prior Rehab/Hospitalizations:  Has the patient had major surgery during 100 days prior to admission? Yes  Current Medications   Current Facility-Administered Medications:  .   stroke: mapping our  early stages of recovery book, , Does not apply, Once, Biby, Sharon L, NP .  0.9 %  sodium chloride infusion, , Intravenous, Continuous, Biby, Sharon L, NP, Last Rate: 75 mL/hr at 03/24/18 0600 .  acetaminophen (TYLENOL) tablet 650 mg, 650 mg, Oral, Q6H PRN, Annie Main L, NP, 650 mg at 03/24/18 0517 .  albuterol (PROVENTIL) (2.5 MG/3ML) 0.083% nebulizer solution 3 mL, 3 mL, Inhalation, Q6H PRN, Biby, Sharon L, NP .  aspirin EC tablet 81 mg, 81 mg, Oral, Daily, Biby, Sharon L, NP, 81 mg at 03/24/18 1017 .  clopidogrel (PLAVIX) tablet 75 mg, 75 mg, Oral, Daily, Biby, Sharon L, NP, 75 mg at 03/24/18 1016 .  eptifibatide (INTEGRILIN) injection, , , PRN, Deveshwar, Sanjeev, MD, 1.5 mg at 03/22/18 1515 .  fluticasone (FLONASE) 50 MCG/ACT nasal spray 2 spray, 2 spray, Each Nare, Daily PRN, Annie Main L, NP, 2 spray  at 03/23/18 1648 .  heparin injection 5,000 Units, 5,000 Units, Subcutaneous, Q8H, Biby, Sharon L, NP, 5,000 Units at 03/24/18 0511 .  HYDROcodone-acetaminophen (NORCO) 7.5-325 MG per tablet 1 tablet, 1 tablet, Oral, Q6H PRN, Layne Benton, NP, 1 tablet at 03/23/18 2235 .  metoCLOPramide (REGLAN) tablet 5 mg, 5 mg, Oral, QID, Biby, Sharon L, NP, 5 mg at 03/24/18 1017 .  mirtazapine (REMERON) tablet 30 mg, 30 mg, Oral, QHS, Biby, Sharon L, NP, 30 mg at 03/23/18 2115 .  mometasone-formoterol (DULERA) 200-5 MCG/ACT inhaler 2 puff, 2 puff, Inhalation, BID, Biby, Sharon L, NP, 2 puff at 03/24/18 0739 .  montelukast (SINGULAIR) tablet 10 mg, 10 mg, Oral, Daily, Biby, Sharon L, NP, 10 mg at 03/23/18 1536 .  multivitamin with minerals tablet 1 tablet, 1 tablet, Oral, Daily, Biby, Sharon L, NP, 1 tablet at 03/24/18 1016 .  olopatadine (PATANOL) 0.1 % ophthalmic solution 1 drop, 1 drop, Both Eyes, BID, Biby, Sharon L, NP, 1 drop at 03/24/18 1016 .  ondansetron (ZOFRAN) injection 4 mg, 4 mg, Intravenous, Q6H PRN, Annie Main L, NP, 4 mg at 03/23/18 0205 .  sertraline (ZOLOFT) tablet 150 mg, 150 mg, Oral, Daily, Biby, Sharon L, NP, 150 mg at 03/24/18 1016 .  tiotropium (SPIRIVA) inhalation capsule 18 mcg, 18 mcg, Inhalation, Daily, Biby, Sharon L, NP, 18 mcg at 03/24/18 4098  Patients Current Diet:  Diet Order           DIET DYS 3 Room service appropriate? Yes; Fluid consistency: Thin  Diet effective now          Precautions / Restrictions Precautions Precautions: Fall Restrictions Weight Bearing Restrictions: No   Has the patient had 2 or more falls or a fall with injury in the past year?Yes  Prior Activity Level Limited Community (1-2x/wk): Prior to admission patient went out with spouse a couple times a week for errands and medical appointments.    Home Assistive Devices / Equipment Home Assistive Devices/Equipment: Environmental consultant (specify type), Dentures (specify type), Eyeglasses Home Equipment:  Walker - 4 wheels, Bedside commode, Shower seat, Cane - single point  Prior Device Use: Indicate devices/aids used by the patient prior to current illness, exacerbation or injury? Dan Humphreys that had she had progressed off of after she had her hip replacement in Feb. 2019  Prior Functional Level Prior Function Level of Independence: Needs assistance Gait / Transfers Assistance Needed: [facility] ADL's / Homemaking Assistance Needed: spouse makes meals but she gets her own lunch; Pt is supervision for bathing in tub  Self Care: Did the patient need help bathing,  dressing, using the toilet or eating? Independent  Indoor Mobility: Did the patient need assistance with walking from room to room (with or without device)? Independent  Stairs: Did the patient need assistance with internal or external stairs (with or without device)? Independent  Functional Cognition: Did the patient need help planning regular tasks such as shopping or remembering to take medications? Independent  Current Functional Level Cognition  Arousal/Alertness: Awake/alert Overall Cognitive Status: Impaired/Different from baseline Current Attention Level: Selective Orientation Level: Oriented X4 Following Commands: Follows one step commands consistently Safety/Judgement: Decreased awareness of safety, Decreased awareness of deficits General Comments: Pt with decreased safety awareness, very impulsive and quick moving then gets very frustrated when asked to slow down. Then apologizes for being rude.  Attention: Focused Focused Attention: Appears intact Memory: Appears intact Awareness: Appears intact Problem Solving: Appears intact Safety/Judgment: Appears intact    Extremity Assessment (includes Sensation/Coordination)  Upper Extremity Assessment: Defer to OT evaluation RUE Deficits / Details: in wrist splint for arthritis, bruising from fall "about 2 weeks ago" generalized weakness, full ROM, decreased coordination and  dropping of ADL objects RUE: Unable to fully assess due to immobilization RUE Sensation: WNL RUE Coordination: decreased fine motor LUE Deficits / Details: strength grossly 3+/5, able to move LUE in all planes of motion. movement is not smooth, and requires cues to use LUE during functional tasks LUE Coordination: decreased fine motor  Lower Extremity Assessment: Generalized weakness    ADLs  Overall ADL's : Needs assistance/impaired Eating/Feeding: Moderate assistance, Sitting Eating/Feeding Details (indicate cue type and reason): trouble holding onto utensils, husband feeding when OT entered "I don't really have an appetite right now" Grooming: Wash/dry hands, Brushing hair, Sitting, Minimal assistance Grooming Details (indicate cue type and reason): due to HR, pt performed seated grooming at the EOB Upper Body Bathing: Minimal assistance, Sitting Lower Body Bathing: Minimal assistance, Sit to/from stand Upper Body Dressing : Minimal assistance, Sitting Lower Body Dressing: Minimal assistance, Sit to/from stand Lower Body Dressing Details (indicate cue type and reason): good flexibility at hips for access to LB, min A for boost and balance while managing clothing Toilet Transfer: Minimal assistance, Ambulation, Comfort height toilet, RW, Grab bars Toilet Transfer Details (indicate cue type and reason): vc for safety and sequencing, min A for boost and balance Toileting- Clothing Manipulation and Hygiene: Minimal assistance, Sit to/from stand Toileting - Clothing Manipulation Details (indicate cue type and reason): Pt dropped toilet paper several times, attempted with both hands Tub/ Shower Transfer: Moderate assistance Functional mobility during ADLs: Minimal assistance, Cueing for safety, Cueing for sequencing, Rolling walker General ADL Comments: very little safety awareness, fine motor coordiation deficits impact her ability to hold onto ADL tools/objects    Mobility  Overal bed  mobility: Needs Assistance Bed Mobility: Supine to Sit, Sit to Supine Supine to sit: Supervision Sit to supine: Supervision General bed mobility comments: pt up in recliner    Transfers  Overall transfer level: Needs assistance Equipment used: Rolling walker (2 wheeled) Transfers: Sit to/from Stand Sit to Stand: Mod assist General transfer comment: assist for lines, balance, safety, lifting    Ambulation / Gait / Stairs / Wheelchair Mobility  Ambulation/Gait Ambulation/Gait assistance: Min assist Ambulation Distance (Feet): 10 Feet Assistive device: Rolling walker (2 wheeled) Gait Pattern/deviations: Step-to pattern, Decreased stride length, Trunk flexed General Gait Details: off balance and with decreased L side awareness not using walker on her L side without cues and assist decreased safety awareness    Posture / Balance Dynamic  Sitting Balance Sitting balance - Comments: able to sit EOB without assist Balance Overall balance assessment: Needs assistance Sitting-balance support: Feet supported, No upper extremity supported Sitting balance-Leahy Scale: Fair Sitting balance - Comments: able to sit EOB without assist Standing balance support: Bilateral upper extremity supported, During functional activity Standing balance-Leahy Scale: Poor Standing balance comment: support needed for balance    Special needs/care consideration BiPAP/CPAP: No CPM: No Continuous Drip IV: No Dialysis: No         Life Vest: No Oxygen: Yes, 2L nasal cannula most of the time PTA  Special Bed: No Trach Size: No Wound Vac (area): No       Skin: Ecchymosis to bilateral arms and legs                                Bowel mgmt: Continent, last BM 03/24/18 Bladder mgmt: Continent  Diabetic mgmt: No, HbgA1c 5.6     Previous Home Environment Living Arrangements: Spouse/significant other  Lives With: Spouse Available Help at Discharge: Family, Available PRN/intermittently Type of Home: House Home  Layout: Two level, Able to live on main level with bedroom/bathroom Alternate Level Stairs-Number of Steps: stays on main Home Access: Stairs to enter Entrance Stairs-Rails: None Entrance Stairs-Number of Steps: 1 Bathroom Shower/Tub: Engineer, manufacturing systems: Standard(toilet riser with handles) Bathroom Accessibility: Yes How Accessible: Accessible via wheelchair Home Care Services: No  Discharge Living Setting Plans for Discharge Living Setting: Patient's home, Lives with (comment)(Spouse ) Type of Home at Discharge: House Discharge Home Layout: Two level, Able to live on main level with bedroom/bathroom Discharge Home Access: Stairs to enter Entrance Stairs-Rails: Can reach both Entrance Stairs-Number of Steps: 1 Discharge Bathroom Shower/Tub: Tub/shower unit Discharge Bathroom Toilet: Standard Discharge Bathroom Accessibility: Yes How Accessible: Accessible via walker Does the patient have any problems obtaining your medications?: No  Social/Family/Support Systems Patient Roles: Spouse, Parent Contact Information: Spouse "HW" Anticipated Caregiver: Spouse Anticipated Caregiver's Contact Information: Cell:[phone] Ability/Limitations of Caregiver: Supervision  Caregiver Availability: 24/7 Discharge Plan Discussed with Primary Caregiver: Yes Is Caregiver In Agreement with Plan?: Yes Does Caregiver/Family have Issues with Lodging/Transportation while Pt is in Rehab?: No  Goals/Additional Needs Patient/Family Goal for Rehab: PT/OT: Mod I-Supervision; SLP: Mod I  Expected length of stay: 8-11 days  Cultural Considerations: Is ok with speaking with a minister  Dietary Needs: Dys.3 textures and thin liquids  Equipment Needs: TBD Special Service Needs: None Additional Information: Pt. with history of pain management and wants to discuss home list with MD or PA  Pt/Family Agrees to Admission and willing to participate: Yes Program Orientation Provided & Reviewed with  Pt/Caregiver Including Roles  & Responsibilities: Yes Additional Information Needs: asking questions about right wrist and pain meds  Information Needs to be Provided By: Medical team   Decrease burden of Care through IP rehab admission: No  Possible need for SNF placement upon discharge: No  Patient Condition: This patient's condition remains as documented in the consult dated 03/24/18, in which the Rehabilitation Physician determined and documented that the patient's condition is appropriate for intensive rehabilitative care in an inpatient rehabilitation facility. Will admit to inpatient rehab today.  Preadmission Screen Completed By:  Fae Pippin, 03/24/2018 1:59 PM ______________________________________________________________________   Discussed status with Dr. Riley Kill on 03/24/18 at 1410 and received telephone approval for admission today.  Admission Coordinator:  Fae Pippin, time 1410/Date 03/24/18

## 2018-03-24 NOTE — Progress Notes (Signed)
Physical Medicine and Rehabilitation Consult Reason for Consult: Decreased functional mobility Referring Physician: Dr. Pearlean Brownie   HPI: Pam Johnson is a 67 y.o. right-handed female with history of spinal stenosis, tobacco abuse, hypertension.  Presented 03/22/2018 with left-sided weakness and facial droop.  Husband had reported a fall last week that was unwitnessed.  Per chart review patient lives with spouse.  Two-level home with bedroom on [address] and one-step to entry.  She uses a straight point cane prior to admission.  Husband can assist as needed.  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  CT angiogram of head and neck showed severe proximal left subclavian stenosis.  Severe right vertebral artery origin stenosis.  80% proximal left ICA stenosis.  Nearly 50% proximal left common carotid stenosis.  CT cerebral perfusion scan again with right ICA occlusion.  Infarct right MCA/PCA distribution.  Patient did not receive TPA.  Patient developed hypotension while in the CT scanner with systolic pressures of 88.  Echocardiogram is still pending.  Interventional radiology consulted for plan of left ICA stenting.  Currently maintained on aspirin and Plavix for CVA prophylaxis.  Subcutaneous heparin for DVT prophylaxis.  Dysphasia #2 thin liquid diet.  Physical and occupational therapy evaluations completed with recommendations of physical medicine rehab consult.   Review of Systems  Constitutional: Negative for chills and fever.  HENT: Negative for hearing loss.   Eyes: Negative for blurred vision and double vision.  Respiratory: Positive for cough and shortness of breath.   Cardiovascular: Positive for leg swelling. Negative for chest pain and palpitations.  Gastrointestinal: Positive for constipation. Negative for nausea.       GERD  Genitourinary: Negative for dysuria, flank pain and hematuria.    Musculoskeletal: Positive for falls and myalgias.  Skin: Negative for rash.  Neurological: Negative for seizures.  Psychiatric/Behavioral: Positive for depression.  All other systems reviewed and are negative.      Past Medical History:  Diagnosis Date  . Acid reflux   . COPD (chronic obstructive pulmonary disease) (HCC)   . Hypertension   . Sacral fracture (HCC)   . Spinal stenosis         Past Surgical History:  Procedure Laterality Date  . ABDOMINAL HYSTERECTOMY    . CHOLECYSTECTOMY    . HERNIA REPAIR    . NISSEN FUNDOPLICATION    . RADIOLOGY WITH ANESTHESIA N/A 03/22/2018   Procedure: IR WITH ANESTHESIA;  Surgeon: Julieanne Cotton, MD;  Location: [facility] OR;  Service: Radiology;  Laterality: N/A;        Family History  Problem Relation Age of Onset  . Hypertension Mother   . Hypertension Father    Social History:  reports that she has been smoking cigarettes.  She has been smoking about 1.00 pack per day. She does not have any smokeless tobacco history on file. She reports that she drinks alcohol. She reports that she does not use drugs. Allergies:  Allergies  Allergen Reactions  . Acyclovir And Related Other (See Comments)    unknown  . Ciprofloxacin Other (See Comments)    Altered mental status.  . Ciprofloxacin Other (See Comments)    Confused and nausea   . Penicillins Other (See Comments)    Unknown reaction Has patient had a PCN reaction causing immediate rash, facial/tongue/throat swelling, SOB or lightheadedness with hypotension: NO Has patient had a PCN reaction causing severe rash involving mucus membranes or skin necrosis: NO  Has patient had a PCN reaction that required hospitalization: NO Has patient had a PCN reaction occurring within the last 10 years: NO If all of the above answers are "NO", then may proceed with Cephalosporin use.   . Promethazine Other (See Comments)    Was advised by her PCP not to take this  medication.  . Morphine Nausea And Vomiting         Medications Prior to Admission  Medication Sig Dispense Refill  . albuterol (PROVENTIL HFA;VENTOLIN HFA) 108 (90 BASE) MCG/ACT inhaler Inhale 2 puffs into the lungs every 6 (six) hours as needed for wheezing or shortness of breath.    Marland Kitchen amLODipine (NORVASC) 2.5 MG tablet Take 2.5 mg by mouth daily.    Marland Kitchen esomeprazole (NEXIUM) 40 MG capsule Take 40 mg by mouth 2 (two) times daily.    . fluticasone (FLONASE) 50 MCG/ACT nasal spray Place 2 sprays into both nostrils daily as needed for allergies or rhinitis.    . Fluticasone-Salmeterol (ADVAIR) 500-50 MCG/DOSE AEPB Inhale 1 puff into the lungs 2 (two) times daily.    Marland Kitchen gabapentin (NEURONTIN) 600 MG tablet Take 600 mg by mouth 3 (three) times daily.    . hydrochlorothiazide (HYDRODIURIL) 25 MG tablet Take 25 mg by mouth daily.    Marland Kitchen HYDROcodone-acetaminophen (NORCO) 7.5-325 MG tablet Take 1 tablet by mouth every 6 (six) hours as needed (breakthrough pain).    . hydrocortisone cream 1 % Apply 1 application topically 2 (two) times daily as needed for itching.    Marland Kitchen ipratropium-albuterol (DUONEB) 0.5-2.5 (3) MG/3ML SOLN Take 3 mLs by nebulization every 6 (six) hours. (Patient taking differently: Take 3 mLs by nebulization every 6 (six) hours as needed (shortness of breath). ) 360 mL 0  . metoCLOPramide (REGLAN) 5 MG tablet Take 5 mg by mouth 4 (four) times daily.    . mirtazapine (REMERON) 30 MG tablet Take 30 mg by mouth at bedtime.    . montelukast (SINGULAIR) 10 MG tablet Take 10 mg by mouth every morning.    . Multiple Vitamin (MULTIVITAMIN WITH MINERALS) TABS tablet Take 1 tablet by mouth daily.    . nitroGLYCERIN (NITROSTAT) 0.4 MG SL tablet Place 0.4 mg under the tongue every 5 (five) minutes as needed for chest pain.    Marland Kitchen olopatadine (PATANOL) 0.1 % ophthalmic solution Place 1 drop into both eyes 2 (two) times daily.    . ondansetron (ZOFRAN ODT) 4 MG disintegrating  tablet Take 1 tablet (4 mg total) by mouth every 8 (eight) hours as needed for nausea or vomiting. 20 tablet 0  . oxyCODONE (OXYCONTIN) 20 mg 12 hr tablet Take 20 mg by mouth 3 (three) times daily.    . sertraline (ZOLOFT) 100 MG tablet Take 150 mg by mouth daily.    Marland Kitchen tiotropium (SPIRIVA) 18 MCG inhalation capsule Place 18 mcg into inhaler and inhale daily.    Marland Kitchen triamcinolone cream (KENALOG) 0.5 % Apply 1 application topically daily as needed (rash).      Home: Home Living Family/patient expects to be discharged to:: Private residence Living Arrangements: Spouse/significant other Available Help at Discharge: Family, Available PRN/intermittently Type of Home: House Home Access: Stairs to enter Secretary/administrator of Steps: 1 Entrance Stairs-Rails: None Home Layout: Two level, Able to live on main level with bedroom/bathroom Alternate Level Stairs-Number of Steps: stays on main Bathroom Shower/Tub: Tub/shower unit [employer]: Standard(toilet riser with handles) Bathroom Accessibility: Yes Home Equipment: Environmental consultant - 4 wheels, Bedside commode, Shower seat, [employer] -  single point  Functional History: Prior Function Level of Independence: Needs assistance Gait / Transfers Assistance Needed: SPC ADL's / Homemaking Assistance Needed: spouse makes meals but she gets her own lunch; Pt is supervision for bathing in tub Functional Status:  Mobility: Bed Mobility Overal bed mobility: Needs Assistance Bed Mobility: Supine to Sit, Sit to Supine Supine to sit: Supervision Sit to supine: Supervision General bed mobility comments: pt up in recliner Transfers Overall transfer level: Needs assistance Equipment used: Rolling walker (2 wheeled) Transfers: Sit to/from Stand Sit to Stand: Mod assist General transfer comment: assist for lines, balance, safety, lifting Ambulation/Gait Ambulation/Gait assistance: Min assist Ambulation Distance (Feet): 10 Feet Assistive device: Rolling  walker (2 wheeled) Gait Pattern/deviations: Step-to pattern, Decreased stride length, Trunk flexed General Gait Details: off balance and with decreased L side awareness not using walker on her L side without cues and assist decreased safety awareness  ADL: ADL Overall ADL's : Needs assistance/impaired Eating/Feeding: Moderate assistance, Sitting Eating/Feeding Details (indicate cue type and reason): trouble holding onto utensils, husband feeding when OT entered "I don't really have an appetite right now" Grooming: Wash/dry hands, Brushing hair, Sitting, Minimal assistance Grooming Details (indicate cue type and reason): due to HR, pt performed seated grooming at the EOB Upper Body Bathing: Minimal assistance, Sitting Lower Body Bathing: Minimal assistance, Sit to/from stand Upper Body Dressing : Minimal assistance, Sitting Lower Body Dressing: Minimal assistance, Sit to/from stand Lower Body Dressing Details (indicate cue type and reason): good flexibility at hips for access to LB, min A for boost and balance while managing clothing Toilet Transfer: Minimal assistance, Ambulation, Comfort height toilet, RW, Grab bars Toilet Transfer Details (indicate cue type and reason): vc for safety and sequencing, min A for boost and balance Toileting- Clothing Manipulation and Hygiene: Minimal assistance, Sit to/from stand Toileting - Clothing Manipulation Details (indicate cue type and reason): Pt dropped toilet paper several times, attempted with both hands Tub/ Shower Transfer: Moderate assistance Functional mobility during ADLs: Minimal assistance, Cueing for safety, Cueing for sequencing, Rolling walker General ADL Comments: very little safety awareness, fine motor coordiation deficits impact her ability to hold onto ADL tools/objects  Cognition: Cognition Overall Cognitive Status: Impaired/Different from baseline Orientation Level: Oriented X4 Cognition Arousal/Alertness:  Awake/alert Behavior During Therapy: Impulsive Overall Cognitive Status: Impaired/Different from baseline Area of Impairment: Attention, Following commands, Safety/judgement, Awareness, Problem solving Current Attention Level: Selective Following Commands: Follows one step commands consistently Safety/Judgement: Decreased awareness of safety, Decreased awareness of deficits Awareness: Emergent Problem Solving: Requires verbal cues, Requires tactile cues, Difficulty sequencing General Comments: Pt with decreased safety awareness, very impulsive and quick moving then gets very frustrated when asked to slow down. Then apologizes for being rude.   Blood pressure (!) 168/89, pulse 76, temperature 99 F (37.2 C), temperature source Oral, resp. rate 20, height  (1.626 m), weight 51.4 kg (113 lb 5.1 oz), SpO2 100 %. Physical Exam  Vitals reviewed. Constitutional: No distress.  HENT:  Bruising left temple  Eyes: Right eye exhibits no discharge. Left eye exhibits no discharge.  Neck: Normal range of motion.  Cardiovascular: Normal rate.  Respiratory: Effort normal.  GI: Soft.  Musculoskeletal: Normal range of motion.  Neurological: She is alert. She has normal reflexes.  Patient is alert. Oriented to hospital, name, month, why she was here. She makes good eye contact with examiner.  Fair awareness of deficits.  Follows simple commands. Decreased visual perceptions of items left of midline. LUE 4/5 with decreased FTN. LLE 4-  to 4/5. RUE and RLE 4+/5. Decreased sensation LUE and ? LLE.   Skin: She is not diaphoretic.  Psychiatric: She has a normal mood and affect.   Assessment/Plan: Diagnosis: right occipital lobe infarct 1. Does the need for close, 24 hr/day medical supervision in concert with the patient's rehab needs make it unreasonable for this patient to be served in a less intensive setting? Yes 2. Co-Morbidities requiring supervision/potential complications: post-stroke sequelae,  HTN, COPD 3. Due to bladder management, bowel management, safety, skin/wound care, disease management, medication administration, pain management and patient education, does the patient require 24 hr/day rehab nursing? Yes 4. Does the patient require coordinated care of a physician, rehab nurse, PT (1-2 hrs/day, 5 days/week), OT (1-2 hrs/day, 5 days/week) and SLP (1-2 hrs/day, 5 days/week) to address physical and functional deficits in the context of the above medical diagnosis(es)? Yes Addressing deficits in the following areas: balance, endurance, locomotion, strength, transferring, bowel/bladder control, bathing, dressing, feeding, grooming, toileting, cognition and psychosocial support 5. Can the patient actively participate in an intensive therapy program of at least 3 hrs of therapy per day at least 5 days per week? Yes 6. The potential for patient to make measurable gains while on inpatient rehab is excellent 7. Anticipated functional outcomes upon discharge from inpatient rehab are modified independent and supervision  with PT, modified independent and supervision with OT, modified independent with SLP. 8. Estimated rehab length of stay to reach the above functional goals is: 8-11 days 9. Anticipated D/C setting: Home 10. Anticipated post D/C treatments: HH therapy and Outpatient therapy 11. Overall Rehab/Functional Prognosis: excellent  RECOMMENDATIONS: This patient's condition is appropriate for continued rehabilitative care in the following setting: CIR Patient has agreed to participate in recommended program. Yes Note that insurance prior authorization may be required for reimbursement for recommended care.  Comment: Rehab Admissions Coordinator to follow up.  Thanks,  Ranelle Oyster, MD, [place] [facility]  I have personally performed a face to face diagnostic evaluation of this patient. Additionally, I have reviewed and concur with the physician assistant's documentation above.      Mcarthur Rossetti Angiulli, PA-C 03/24/2018          Revision History                        Routing History

## 2018-03-24 NOTE — Evaluation (Signed)
Speech Language Pathology Evaluation Patient Details Name: TAI SYFERT MRN: [id number] DOB: 1950-11-25 Today's Date: 03/24/2018 Time: 1200-1218 SLP Time Calculation (min) (ACUTE ONLY): 18 min  Problem List:  Patient Active Problem List   Diagnosis Date Noted  . 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 Pneumonia 10/18/2015   Past Medical History:  Past Medical History:  Diagnosis Date  . Acid reflux   . COPD (chronic obstructive pulmonary disease) (HCC)   . Hypertension   . Sacral fracture (HCC)   . Spinal stenosis    Past Surgical History:  Past Surgical History:  Procedure Laterality Date  . ABDOMINAL HYSTERECTOMY    . CHOLECYSTECTOMY    . HERNIA REPAIR    . IR ANGIO INTRA EXTRACRAN SEL COM CAROTID INNOMINATE BILAT MOD SED  03/22/2018  . IR ANGIO VERTEBRAL SEL VERTEBRAL UNI R MOD SED  03/22/2018  . IR CT HEAD LTD  03/22/2018  . IR PERCUTANEOUS ART THROMBECTOMY/INFUSION INTRACRANIAL INC DIAG ANGIO  03/22/2018  . NISSEN FUNDOPLICATION    . RADIOLOGY WITH ANESTHESIA N/A 03/22/2018   Procedure: IR WITH ANESTHESIA;  Surgeon: Julieanne Cotton, MD;  Location: [facility] OR;  Service: Radiology;  Laterality: N/A;   HPI:  67 y.o. female stenting as code stroke with left-sided deficits including left arm flaccidity, left leg weakness, left facial droop, left-sided neglect.  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  Patient was brought to interventional radiology. briefly intubated for procedure. MRI is pending. Pt reports a history of globus, GERD and smoking. She always wears her dentures to eat, but also has ulcers on her gums and has to put a special substance on her denture to treat this and does not currently have it.    Assessment / Plan / Recommendation Clinical Impression  Cognitive/linguistic and motor speech screen were completed. Oral mechanism  exam revealed lingual deviation to the right given movement.  All other structures and function appeared to be functional.  Speech was clear and easy to understand.  No discernible dysarthria.  The patient achieved a score of 26/30 on the Mini Mental State Exam suggesting functional cognitive/linguistic skills.  The patient was oriented to person, place, time and situation.  She easily recalled 3 novel words immediately and with a short delay.  She was able to attend to presented task but was found to have a left field cut which impacted her ability to perform the writing and copying tasks.  She was able to name objects, follow a 3 step command and read/comprehend a short sentence.  She was able to provide logical solutions to simple problems.  She was aware of her deficits and potential impact/safety issues they may cause.  ST will not follow during acute but recommend in depth cognitive eval particularly to assess impact left field cut will have on her functional status.       SLP Assessment  SLP Recommendation/Assessment: All further Speech Lanaguage Pathology  needs can be addressed in the next venue of care SLP Visit Diagnosis: Attention and concentration deficit Attention and concentration deficit following: Cerebral infarction    Follow Up Recommendations  Other - In depth cognitive assessment at next level of care to evaluate effects left field cut will have on her functional abilities and to more formally assess writing.          SLP Evaluation Cognition  Overall Cognitive Status: Impaired/Different from  baseline Arousal/Alertness: Awake/alert Orientation Level: Oriented X4 Attention: Focused Focused Attention: Appears intact Memory: Appears intact Awareness: Appears intact Problem Solving: Appears intact Safety/Judgment: Appears intact       Comprehension  Auditory Comprehension Overall Auditory Comprehension: Appears within functional limits for tasks assessed Commands: Within  Functional Limits Conversation: Simple Reading Comprehension Reading Status: Within funtional limits    Expression Expression Primary Mode of Expression: Verbal Verbal Expression Overall Verbal Expression: Appears within functional limits for tasks assessed Initiation: No impairment Automatic Speech: Name;Social Response Level of Generative/Spontaneous Verbalization: Conversation Repetition: No impairment Naming: No impairment Pragmatics: No impairment Written Expression Dominant Hand: Left Written Expression: Exceptions to [facility] Self Formulation Ability: Letter   Oral / Motor  Motor Speech Overall Motor Speech: Appears within functional limits for tasks assessed Respiration: Within functional limits Phonation: Normal Resonance: Within functional limits Articulation: Within functional limitis Intelligibility: Intelligible Motor Planning: Witnin functional limits Motor Speech Errors: Not applicable   GO                    Dimas Aguas, MA, CCC-SLP Acute Rehab SLP [phone] [name] 03/24/2018, 12:34 PM

## 2018-03-24 NOTE — Consult Note (Signed)
Physical Medicine and Rehabilitation Consult Reason for Consult: Decreased functional mobility Referring Physician: Dr. Pearlean Brownie   HPI: Pam Johnson is a 67 y.o. right-handed female with history of spinal stenosis, tobacco abuse, hypertension.  Presented 03/22/2018 with left-sided weakness and facial droop.  Husband had reported a fall last week that was unwitnessed.  Per chart review patient lives with spouse.  Two-level home with bedroom on [address] and one-step to entry.  She uses a straight point cane prior to admission.  Husband can assist as needed.  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  CT angiogram of head and neck showed severe proximal left subclavian stenosis.  Severe right vertebral artery origin stenosis.  80% proximal left ICA stenosis.  Nearly 50% proximal left common carotid stenosis.  CT cerebral perfusion scan again with right ICA occlusion.  Infarct right MCA/PCA distribution.  Patient did not receive TPA.  Patient developed hypotension while in the CT scanner with systolic pressures of 88.  Echocardiogram is still pending.  Interventional radiology consulted for plan of left ICA stenting.  Currently maintained on aspirin and Plavix for CVA prophylaxis.  Subcutaneous heparin for DVT prophylaxis.  Dysphasia #2 thin liquid diet.  Physical and occupational therapy evaluations completed with recommendations of physical medicine rehab consult.   Review of Systems  Constitutional: Negative for chills and fever.  HENT: Negative for hearing loss.   Eyes: Negative for blurred vision and double vision.  Respiratory: Positive for cough and shortness of breath.   Cardiovascular: Positive for leg swelling. Negative for chest pain and palpitations.  Gastrointestinal: Positive for constipation. Negative for nausea.       GERD  Genitourinary: Negative for dysuria, flank pain and hematuria.    Musculoskeletal: Positive for falls and myalgias.  Skin: Negative for rash.  Neurological: Negative for seizures.  Psychiatric/Behavioral: Positive for depression.  All other systems reviewed and are negative.  Past Medical History:  Diagnosis Date  . Acid reflux   . COPD (chronic obstructive pulmonary disease) (HCC)   . Hypertension   . Sacral fracture (HCC)   . Spinal stenosis    Past Surgical History:  Procedure Laterality Date  . ABDOMINAL HYSTERECTOMY    . CHOLECYSTECTOMY    . HERNIA REPAIR    . NISSEN FUNDOPLICATION    . RADIOLOGY WITH ANESTHESIA N/A 03/22/2018   Procedure: IR WITH ANESTHESIA;  Surgeon: Julieanne Cotton, MD;  Location: [facility] OR;  Service: Radiology;  Laterality: N/A;   Family History  Problem Relation Age of Onset  . Hypertension Mother   . Hypertension Father    Social History:  reports that she has been smoking cigarettes.  She has been smoking about 1.00 pack per day. She does not have any smokeless tobacco history on file. She reports that she drinks alcohol. She reports that she does not use drugs. Allergies:  Allergies  Allergen Reactions  . Acyclovir And Related Other (See Comments)    unknown  . Ciprofloxacin Other (See Comments)    Altered mental status.  . Ciprofloxacin Other (See Comments)    Confused and nausea   . Penicillins Other (See Comments)    Unknown reaction Has patient had a PCN reaction causing immediate rash, facial/tongue/throat swelling, SOB or lightheadedness with hypotension: NO Has patient had a PCN reaction causing severe rash involving mucus membranes or skin necrosis: NO Has patient had a PCN reaction that required hospitalization: NO  Has patient had a PCN reaction occurring within the last 10 years: NO If all of the above answers are "NO", then may proceed with Cephalosporin use.   . Promethazine Other (See Comments)    Was advised by her PCP not to take this medication.  . Morphine Nausea And Vomiting    Medications Prior to Admission  Medication Sig Dispense Refill  . albuterol (PROVENTIL HFA;VENTOLIN HFA) 108 (90 BASE) MCG/ACT inhaler Inhale 2 puffs into the lungs every 6 (six) hours as needed for wheezing or shortness of breath.    Marland Kitchen amLODipine (NORVASC) 2.5 MG tablet Take 2.5 mg by mouth daily.    Marland Kitchen esomeprazole (NEXIUM) 40 MG capsule Take 40 mg by mouth 2 (two) times daily.    . fluticasone (FLONASE) 50 MCG/ACT nasal spray Place 2 sprays into both nostrils daily as needed for allergies or rhinitis.    . Fluticasone-Salmeterol (ADVAIR) 500-50 MCG/DOSE AEPB Inhale 1 puff into the lungs 2 (two) times daily.    Marland Kitchen gabapentin (NEURONTIN) 600 MG tablet Take 600 mg by mouth 3 (three) times daily.    . hydrochlorothiazide (HYDRODIURIL) 25 MG tablet Take 25 mg by mouth daily.    Marland Kitchen HYDROcodone-acetaminophen (NORCO) 7.5-325 MG tablet Take 1 tablet by mouth every 6 (six) hours as needed (breakthrough pain).    . hydrocortisone cream 1 % Apply 1 application topically 2 (two) times daily as needed for itching.    Marland Kitchen ipratropium-albuterol (DUONEB) 0.5-2.5 (3) MG/3ML SOLN Take 3 mLs by nebulization every 6 (six) hours. (Patient taking differently: Take 3 mLs by nebulization every 6 (six) hours as needed (shortness of breath). ) 360 mL 0  . metoCLOPramide (REGLAN) 5 MG tablet Take 5 mg by mouth 4 (four) times daily.    . mirtazapine (REMERON) 30 MG tablet Take 30 mg by mouth at bedtime.    . montelukast (SINGULAIR) 10 MG tablet Take 10 mg by mouth every morning.    . Multiple Vitamin (MULTIVITAMIN WITH MINERALS) TABS tablet Take 1 tablet by mouth daily.    . nitroGLYCERIN (NITROSTAT) 0.4 MG SL tablet Place 0.4 mg under the tongue every 5 (five) minutes as needed for chest pain.    Marland Kitchen olopatadine (PATANOL) 0.1 % ophthalmic solution Place 1 drop into both eyes 2 (two) times daily.    . ondansetron (ZOFRAN ODT) 4 MG disintegrating tablet Take 1 tablet (4 mg total) by mouth every 8 (eight) hours as needed for  nausea or vomiting. 20 tablet 0  . oxyCODONE (OXYCONTIN) 20 mg 12 hr tablet Take 20 mg by mouth 3 (three) times daily.    . sertraline (ZOLOFT) 100 MG tablet Take 150 mg by mouth daily.    Marland Kitchen tiotropium (SPIRIVA) 18 MCG inhalation capsule Place 18 mcg into inhaler and inhale daily.    Marland Kitchen triamcinolone cream (KENALOG) 0.5 % Apply 1 application topically daily as needed (rash).      Home: Home Living Family/patient expects to be discharged to:: Private residence Living Arrangements: Spouse/significant other Available Help at Discharge: Family, Available PRN/intermittently Type of Home: House Home Access: Stairs to enter Secretary/administrator of Steps: 1 Entrance Stairs-Rails: None Home Layout: Two level, Able to live on main level with bedroom/bathroom Alternate Level Stairs-Number of Steps: stays on main Bathroom Shower/Tub: Tub/shower unit [employer]: Standard(toilet riser with handles) Bathroom Accessibility: Yes Home Equipment: Environmental consultant - 4 wheels, Bedside commode, Shower seat, Cane - single point  Functional History: Prior Function Level of Independence: Needs assistance Gait / Transfers Assistance  Needed: SPC ADL's / Homemaking Assistance Needed: spouse makes meals but she gets her own lunch; Pt is supervision for bathing in tub Functional Status:  Mobility: Bed Mobility Overal bed mobility: Needs Assistance Bed Mobility: Supine to Sit, Sit to Supine Supine to sit: Supervision Sit to supine: Supervision General bed mobility comments: pt up in recliner Transfers Overall transfer level: Needs assistance Equipment used: Rolling walker (2 wheeled) Transfers: Sit to/from Stand Sit to Stand: Mod assist General transfer comment: assist for lines, balance, safety, lifting Ambulation/Gait Ambulation/Gait assistance: Min assist Ambulation Distance (Feet): 10 Feet Assistive device: Rolling walker (2 wheeled) Gait Pattern/deviations: Step-to pattern, Decreased stride length,  Trunk flexed General Gait Details: off balance and with decreased L side awareness not using walker on her L side without cues and assist decreased safety awareness    ADL: ADL Overall ADL's : Needs assistance/impaired Eating/Feeding: Moderate assistance, Sitting Eating/Feeding Details (indicate cue type and reason): trouble holding onto utensils, husband feeding when OT entered "I don't really have an appetite right now" Grooming: Wash/dry hands, Brushing hair, Sitting, Minimal assistance Grooming Details (indicate cue type and reason): due to HR, pt performed seated grooming at the EOB Upper Body Bathing: Minimal assistance, Sitting Lower Body Bathing: Minimal assistance, Sit to/from stand Upper Body Dressing : Minimal assistance, Sitting Lower Body Dressing: Minimal assistance, Sit to/from stand Lower Body Dressing Details (indicate cue type and reason): good flexibility at hips for access to LB, min A for boost and balance while managing clothing Toilet Transfer: Minimal assistance, Ambulation, Comfort height toilet, RW, Grab bars Toilet Transfer Details (indicate cue type and reason): vc for safety and sequencing, min A for boost and balance Toileting- Clothing Manipulation and Hygiene: Minimal assistance, Sit to/from stand Toileting - Clothing Manipulation Details (indicate cue type and reason): Pt dropped toilet paper several times, attempted with both hands Tub/ Shower Transfer: Moderate assistance Functional mobility during ADLs: Minimal assistance, Cueing for safety, Cueing for sequencing, Rolling walker General ADL Comments: very little safety awareness, fine motor coordiation deficits impact her ability to hold onto ADL tools/objects  Cognition: Cognition Overall Cognitive Status: Impaired/Different from baseline Orientation Level: Oriented X4 Cognition Arousal/Alertness: Awake/alert Behavior During Therapy: Impulsive Overall Cognitive Status: Impaired/Different from  baseline Area of Impairment: Attention, Following commands, Safety/judgement, Awareness, Problem solving Current Attention Level: Selective Following Commands: Follows one step commands consistently Safety/Judgement: Decreased awareness of safety, Decreased awareness of deficits Awareness: Emergent Problem Solving: Requires verbal cues, Requires tactile cues, Difficulty sequencing General Comments: Pt with decreased safety awareness, very impulsive and quick moving then gets very frustrated when asked to slow down. Then apologizes for being rude.   Blood pressure (!) 168/89, pulse 76, temperature 99 F (37.2 C), temperature source Oral, resp. rate 20, height 5\' 4"  (1.626 m), weight 51.4 kg (113 lb 5.1 oz), SpO2 100 %. Physical Exam  Vitals reviewed. Constitutional: No distress.  HENT:  Bruising left temple  Eyes: Right eye exhibits no discharge. Left eye exhibits no discharge.  Neck: Normal range of motion.  Cardiovascular: Normal rate.  Respiratory: Effort normal.  GI: Soft.  Musculoskeletal: Normal range of motion.  Neurological: She is alert. She has normal reflexes.  Patient is alert. Oriented to hospital, name, month, why she was here. She makes good eye contact with examiner.  Fair awareness of deficits.  Follows simple commands. Decreased visual perceptions of items left of midline. LUE 4/5 with decreased FTN. LLE 4- to 4/5. RUE and RLE 4+/5. Decreased sensation LUE and ? LLE.  Skin: She is not diaphoretic.  Psychiatric: She has a normal mood and affect.    No results found for this or any previous visit (from the past 24 hour(s)). Ct Angio Head W Or Wo Contrast  Result Date: 03/22/2018 CLINICAL DATA:  Right MCA syndrome EXAM: CT ANGIOGRAPHY HEAD AND NECK CT PERFUSION BRAIN TECHNIQUE: Multidetector CT imaging of the head and neck was performed using the standard protocol during bolus administration of intravenous contrast. Multiplanar CT image reconstructions and MIPs were  obtained to evaluate the vascular anatomy. Carotid stenosis measurements (when applicable) are obtained utilizing NASCET criteria, using the distal internal carotid diameter as the denominator. Multiphase CT imaging of the brain was performed following IV bolus contrast injection. Subsequent parametric perfusion maps were calculated using RAPID software. CONTRAST:  Dose not currently available COMPARISON:  None. FINDINGS: CTA NECK FINDINGS Aortic arch: Extensive atherosclerotic plaque.  No dissection. Right carotid system: Bulky, irregular calcified plaque at the brachiocephalic origin. Prominent noncalcified plaque primarily on the anterior wall the common carotid with up to 50% stenosis. Bulky plaque at the bifurcation of the common carotid with ICA occlusion. The non-opacified vessel is difficult to distinguish and may be collapsed from chronic occlusion. Left carotid system: Diffuse atherosclerotic plaque. Proximal common carotid stenosis of 50% on coronal reformats. Bulky calcified plaque at the ICA bulb causes ~80% stenosis. Negative for ulceration. Vertebral arteries: Extensive calcified plaque along the proximal left subclavian with nonvisualized lumen in places due to degree of stenosis and calcified plaque blooming. Beyond the vertebral origin there is additional bulky calcified plaque with at least 50% stenosis before and at the rib crossing (quantification is limited given the extent of disease which makes it difficult to determine baseline diameter). The left vertebral artery is occluded at the V2 segment. There is severe proximal right vertebral stenosis due to calcified plaque, with non visible lumen. Right proximal subclavian stenosis measures up to 50%. Skeleton: No acute finding. Severe cervical spine disc and facet degeneration with multilevel listhesis and kyphotic deformity. Other neck: No acute finding. Upper chest: Emphysema Review of the MIP images confirms the above findings CTA HEAD  FINDINGS Anterior circulation: Right ICA occlusion to the level of the terminus where there is reconstitution presumably via the small P1 segment and a tiny anterior communicating artery. Right MCA branches are under filled but symmetrically opacified. Symmetric bilateral ACA flow. Atherosclerotic plaque on the left carotid siphon with mild-to-moderate narrowing. No left-sided branch occlusion is seen. Posterior circulation: Robust flow in the right vertebral artery. Presumed retrograde flow in the left vertebral artery. Both picas are enhancing. There is moderate narrowing and calcified plaque at the left V4 segment. Hypoplastic right P1 segment. Under filled right PCA branches. Venous sinuses: Not yet opacified Anatomic variants: Fetal type right PCA Delayed phase: Not obtained in the emergent setting Review of the MIP images confirms the above findings CT Brain Perfusion Findings: CBF (<30%) Volume: 33mL Perfusion (Tmax>6.0s) volume: 336mL Mismatch Volume: 303mL Infarction Location:The infarct by CT perfusion encompasses the low-density completed infarct by noncontrast CT. It also includes areas along the upper watershed that were not clearly seen by CT. The penumbra calculation includes ACA territory. At time of head CT call report, Dr Amada JupiterKirkpatrick was already aware of RICA occlusion. Communication of perfusion findings is underway. 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 Limited major collateral pathways-fetal type right PCA with hypoplastic right P1 segment and probable tiny anterior communicating artery. 33 cc  infarct in the right MCA/PCA distribution, which includes the area of completed infarct by noncontrast CT and additional parenchyma along the watershed. Nearly the entire right cerebral hemisphere is in the penumbra range (336 cc). Severe underlying atherosclerotic disease: *Severe proximal left subclavian stenosis. Occlusion of  the non dominant left V2 segment with intracranial reconstitution. *Severe right vertebral artery origin stenosis. *~80% proximal left ICA stenosis. Nearly 50% proximal left common carotid stenosis. *~50% stenosis at the right common carotid origin. Emphysema. Electronically Signed   By: Marnee Spring M.D.   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 Cough and shortness of breath. Aspiration. EXAM: CHEST - 2 VIEW COMPARISON:  01/27/2016 chest radiographs and earlier. FINDINGS: Upright AP and lateral views of the chest at 1054 hours. Stable large lung volumes. No pneumothorax, pulmonary edema or consolidation. Small bilateral pleural effusions suspected. Chronic hiatal hernia suspected. Other mediastinal contours are within normal limits. Calcified aortic atherosclerosis. Visualized tracheal air column is within normal limits. Chronic lower thoracic augmented compression fracture. Partially visible right proximal humerus ORIF hardware. Stable visualized osseous structures. Negative visible bowel gas pattern. IMPRESSION: 1. Probable small bilateral pleural effusions. 2. No other acute cardiopulmonary abnormality identified. 3. Chronic hiatal hernia suspected. 4.  Aortic Atherosclerosis (ICD10-I70.0). Electronically Signed   By: Odessa Fleming M.D.   On: 03/23/2018 10:52   Ct Head Wo Contrast  Addendum Date: 03/23/2018   ADDENDUM REPORT: 03/23/2018 13:12 ADDENDUM: Study discussed by telephone with Neurology Dr. Pearlean Brownie on 03/23/2018 at 1304 hours. He advises that clinically the patient is doing well, with improved deficits since presentation and no known new or right side deficits. This argues against acute ischemia in the left hemisphere. The patient was unable to tolerate Brain MRI earlier today. Electronically Signed   By: Odessa Fleming M.D.   On: 03/23/2018 13:12   Result Date: 03/23/2018 CLINICAL  DATA:  67 year old female status post right ICA large vessel occlusion. Unsuccessful revascularization. Right anterior frontal lobe, right parietal and occipital lobe, and watershed areas of core infarct suspected at presentation. EXAM: CT HEAD WITHOUT CONTRAST TECHNIQUE: Contiguous axial images were obtained from the base of the skull through the vertex without intravenous contrast. COMPARISON:  CTA and CT perfusion plus presentation noncontrast head CT 03/22/2018. FINDINGS: Brain: Mildly enlarged and progressed areas of cytotoxic edema in the right parietal and occipital lobes since the presentation CT 1304 hours yesterday (series 3, image 19). Similar mild patchy increased cytotoxic edema related hypodensity in the right superior frontal lobe (images 22-24). No associated hemorrhage or mass effect. However, at the same time there is new confluent white matter hypodensity in the left corona radiata (series 3, image 18), tracking to the deep white matter capsules. The left M2 SIL a and basal ganglia appear to remain stable. Questionable new hypodensity at the lower left thalamus (image 13). Elsewhere gray-white matter differentiation appears to remain stable. No intracranial mass effect. No intracranial hemorrhage or ventriculomegaly. Vascular: Calcified atherosclerosis at the skull base. New calcified density in the left sylvian fissure on series 3, image 13 suggesting interval left MCA branch plaque thromboembolism. Skull: No acute osseous abnormality identified. Sinuses/Orbits: Visualized paranasal sinuses and mastoids are stable and generally well pneumatized. Other: No acute orbit or scalp soft tissue findings. IMPRESSION: 1. Comparatively mild evolution of right hemisphere cytotoxic edema since 1304 hours yesterday considering the CTA/CTP findings at presentation. The right  hemisphere core infarct size has perhaps doubled since that time. 2. Questionable new white matter ischemia in the left hemisphere. And  there is evidence of interval left MCA branch thromboembolism (series 3, image 13). 3. No intracranial hemorrhage or mass effect. Electronically Signed: By: Odessa Fleming M.D. On: 03/23/2018 13:01   Ct Angio Neck W Or Wo Contrast  Result Date: 03/22/2018 CLINICAL DATA:  Right MCA syndrome EXAM: CT ANGIOGRAPHY HEAD AND NECK CT PERFUSION BRAIN TECHNIQUE: Multidetector CT imaging of the head and neck was performed using the standard protocol during bolus administration of intravenous contrast. Multiplanar CT image reconstructions and MIPs were obtained to evaluate the vascular anatomy. Carotid stenosis measurements (when applicable) are obtained utilizing NASCET criteria, using the distal internal carotid diameter as the denominator. Multiphase CT imaging of the brain was performed following IV bolus contrast injection. Subsequent parametric perfusion maps were calculated using RAPID software. CONTRAST:  Dose not currently available COMPARISON:  None. FINDINGS: CTA NECK FINDINGS Aortic arch: Extensive atherosclerotic plaque.  No dissection. Right carotid system: Bulky, irregular calcified plaque at the brachiocephalic origin. Prominent noncalcified plaque primarily on the anterior wall the common carotid with up to 50% stenosis. Bulky plaque at the bifurcation of the common carotid with ICA occlusion. The non-opacified vessel is difficult to distinguish and may be collapsed from chronic occlusion. Left carotid system: Diffuse atherosclerotic plaque. Proximal common carotid stenosis of 50% on coronal reformats. Bulky calcified plaque at the ICA bulb causes ~80% stenosis. Negative for ulceration. Vertebral arteries: Extensive calcified plaque along the proximal left subclavian with nonvisualized lumen in places due to degree of stenosis and calcified plaque blooming. Beyond the vertebral origin there is additional bulky calcified plaque with at least 50% stenosis before and at the rib crossing (quantification is limited  given the extent of disease which makes it difficult to determine baseline diameter). The left vertebral artery is occluded at the V2 segment. There is severe proximal right vertebral stenosis due to calcified plaque, with non visible lumen. Right proximal subclavian stenosis measures up to 50%. Skeleton: No acute finding. Severe cervical spine disc and facet degeneration with multilevel listhesis and kyphotic deformity. Other neck: No acute finding. Upper chest: Emphysema Review of the MIP images confirms the above findings CTA HEAD FINDINGS Anterior circulation: Right ICA occlusion to the level of the terminus where there is reconstitution presumably via the small P1 segment and a tiny anterior communicating artery. Right MCA branches are under filled but symmetrically opacified. Symmetric bilateral ACA flow. Atherosclerotic plaque on the left carotid siphon with mild-to-moderate narrowing. No left-sided branch occlusion is seen. Posterior circulation: Robust flow in the right vertebral artery. Presumed retrograde flow in the left vertebral artery. Both picas are enhancing. There is moderate narrowing and calcified plaque at the left V4 segment. Hypoplastic right P1 segment. Under filled right PCA branches. Venous sinuses: Not yet opacified Anatomic variants: Fetal type right PCA Delayed phase: Not obtained in the emergent setting Review of the MIP images confirms the above findings CT Brain Perfusion Findings: CBF (<30%) Volume: 33mL Perfusion (Tmax>6.0s) volume: Mismatch Volume: Infarction Location:The infarct by CT perfusion encompasses the low-density completed infarct by noncontrast CT. It also includes areas along the upper watershed that were not clearly seen by CT. The penumbra calculation includes ACA territory. At time of head CT call report, Dr Amada Jupiter was already aware of RICA occlusion. Communication of perfusion findings is underway. IMPRESSION: Right ICA occlusion beginning at the  common carotid bifurcation with reconstitution at  the ICA terminus. Limited major collateral pathways-fetal type right PCA with hypoplastic right P1 segment and probable tiny anterior communicating artery. 33 cc infarct in the right MCA/PCA distribution, which includes the area of completed infarct by noncontrast CT and additional parenchyma along the watershed. Nearly the entire right cerebral hemisphere is in the penumbra range (336 cc). Severe underlying atherosclerotic disease: *Severe proximal left subclavian stenosis. 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 *Severe right vertebral artery origin stenosis. *~80% proximal left ICA stenosis. Nearly 50% proximal left common carotid stenosis. *~50% stenosis at the right common carotid origin. Emphysema. Electronically Signed   By: Marnee Spring M.D.   On: 03/22/2018 13:45   Dg Hand 2 View Right  Result Date: 03/23/2018 CLINICAL DATA:  Acute onset of dorsal right hand bruising and pain. Initial encounter. EXAM: RIGHT HAND - 2 VIEW COMPARISON:  None. 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 Mild degenerative change is noted at the first carpometacarpal joint, and there is mild underlying osseous fragmentation. Diffuse soft tissue swelling is noted about the wrist and dorsum of the hand. Remaining visualized joint spaces are grossly unremarkable. There is no definite evidence of fracture. IMPRESSION: 1. 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 This is of uncertain etiology. Would correlate with the patient's history. 2. Mild degenerative change at the first carpometacarpal joint, with mild underlying osseous fragmentation. 3. Diffuse soft tissue  swelling about the wrist and dorsum of the hand. Electronically Signed   By: Roanna Raider M.D.   On: 03/23/2018 22:39   Ct Cerebral Perfusion W Contrast  Result Date: 03/22/2018 CLINICAL DATA:  Right MCA syndrome EXAM: CT ANGIOGRAPHY HEAD AND NECK CT PERFUSION BRAIN TECHNIQUE: Multidetector CT imaging of the head and neck was performed using the standard protocol during bolus administration of intravenous contrast. Multiplanar CT image reconstructions and MIPs were obtained to evaluate the vascular anatomy. Carotid stenosis measurements (when applicable) are obtained utilizing NASCET criteria, using the distal internal carotid diameter as the denominator. Multiphase CT imaging of the brain was performed following IV bolus contrast injection. Subsequent parametric perfusion maps were calculated using RAPID software. CONTRAST:  Dose not currently available COMPARISON:  None. FINDINGS: CTA NECK FINDINGS Aortic arch: Extensive atherosclerotic plaque.  No dissection. Right carotid system: Bulky, irregular calcified plaque at the brachiocephalic origin. Prominent noncalcified plaque primarily on the anterior wall the common carotid with up to 50% stenosis. Bulky plaque at the bifurcation of the common carotid with ICA occlusion. The non-opacified vessel is difficult to distinguish and may be collapsed from chronic occlusion. Left carotid system: Diffuse atherosclerotic plaque. Proximal common carotid stenosis of 50% on coronal reformats. Bulky calcified plaque at the ICA bulb causes ~80% stenosis. Negative for ulceration. Vertebral arteries: Extensive calcified plaque along the proximal left subclavian with nonvisualized lumen in places due to degree of stenosis and calcified plaque blooming. Beyond the vertebral origin there is additional bulky calcified plaque with at least 50% stenosis before and at the rib crossing (quantification is limited given the extent of disease which makes it difficult to determine  baseline diameter). The left vertebral artery is occluded at the V2 segment. There is severe proximal right vertebral stenosis due to calcified plaque, with non  visible lumen. Right proximal subclavian stenosis measures up to 50%. Skeleton: No acute finding. Severe cervical spine disc and facet degeneration with multilevel listhesis and kyphotic deformity. Other neck: No acute finding. Upper chest: Emphysema Review of the MIP images confirms the above findings CTA HEAD FINDINGS Anterior circulation: Right ICA occlusion to the level of the terminus where there is reconstitution presumably via the small P1 segment and a tiny anterior communicating artery. Right MCA branches are under filled but symmetrically opacified. Symmetric bilateral ACA flow. Atherosclerotic plaque on the left carotid siphon with mild-to-moderate narrowing. No left-sided branch occlusion is seen. Posterior circulation: Robust flow in the right vertebral artery. Presumed retrograde flow in the left vertebral artery. Both picas are enhancing. There is moderate narrowing and calcified plaque at the left V4 segment. Hypoplastic right P1 segment. Under filled right PCA branches. Venous sinuses: Not yet opacified Anatomic variants: Fetal type right PCA Delayed phase: Not obtained in the emergent setting Review of the MIP images confirms the above findings CT Brain Perfusion Findings: CBF (<30%) Volume: 33mL Perfusion (Tmax>6.0s) volume: Mismatch Volume: Infarction Location:The infarct by CT perfusion encompasses the low-density completed infarct by noncontrast CT. It also includes areas along the upper watershed that were not clearly seen by CT. The penumbra calculation includes ACA territory. At time of head CT call report, Dr Amada Jupiter was already aware of RICA occlusion. Communication of perfusion findings is underway. 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  Limited major collateral pathways-fetal type right PCA with hypoplastic right P1 segment and probable tiny anterior communicating artery. 33 cc infarct in the right MCA/PCA distribution, which includes the area of completed infarct by noncontrast CT and additional parenchyma along the watershed. Nearly the entire right cerebral hemisphere is in the penumbra range (336 cc). Severe underlying atherosclerotic disease: *Severe proximal left subclavian stenosis. 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 *Severe right vertebral artery origin stenosis. *~80% proximal left ICA stenosis. Nearly 50% proximal left common carotid stenosis. *~50% stenosis at the right common carotid origin. Emphysema. Electronically Signed   By: Marnee Spring M.D.   On: 03/22/2018 13:45   Ct Head Code Stroke Wo Contrast  Result Date: 03/22/2018 CLINICAL DATA:  Code stroke. Left-sided weakness. Last seen normal 12 10 EXAM: CT HEAD WITHOUT CONTRAST TECHNIQUE: Contiguous axial images were obtained from the base of the skull through the vertex without intravenous contrast. COMPARISON:  08/30/2006 FINDINGS: Brain: Moderate area of cytotoxic edema in the right occipital lobe. More well-defined cortical and subcortical low-density in the high anterior right frontal lobe, also affecting a moderate area, likely pre-existing infarct. There is mild presumed small vessel ischemic change in the cerebral white matter. No hemorrhage. No hydrocephalus. Vascular: Atherosclerotic calcification.  No high-density vessel. Skull: Negative Sinuses/Orbits: Negative Other: These results were called by telephone at the time of interpretation on 03/22/2018 at 1:13 pm to Dr. Leonarda Salon, who was already aware. ASPECTS [facility] Stroke Program Early CT Score) - Ganglionic level infarction (caudate, lentiform nuclei, internal capsule, insula, M1-M3 cortex): 7 - Supraganglionic infarction (M4-M6 cortex): 1-2 Total score (0-10 with 10  being normal): 8-9 IMPRESSION: 1. 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 2. Favored remote anterior right frontal cortex and white matter infarct. 3. Negative for hemorrhage. Electronically Signed   By: Marnee Spring M.D.   On: 03/22/2018 13:17     Assessment/Plan: Diagnosis: right occipital lobe infarct 1. Does the need  for close, 24 hr/day medical supervision in concert with the patient's rehab needs make it unreasonable for this patient to be served in a less intensive setting? Yes 2. Co-Morbidities requiring supervision/potential complications: post-stroke sequelae, HTN, COPD 3. Due to bladder management, bowel management, safety, skin/wound care, disease management, medication administration, pain management and patient education, does the patient require 24 hr/day rehab nursing? Yes 4. Does the patient require coordinated care of a physician, rehab nurse, PT (1-2 hrs/day, 5 days/week), OT (1-2 hrs/day, 5 days/week) and SLP (1-2 hrs/day, 5 days/week) to address physical and functional deficits in the context of the above medical diagnosis(es)? Yes Addressing deficits in the following areas: balance, endurance, locomotion, strength, transferring, bowel/bladder control, bathing, dressing, feeding, grooming, toileting, cognition and psychosocial support 5. Can the patient actively participate in an intensive therapy program of at least 3 hrs of therapy per day at least 5 days per week? Yes 6. The potential for patient to make measurable gains while on inpatient rehab is excellent 7. Anticipated functional outcomes upon discharge from inpatient rehab are modified independent and supervision  with PT, modified independent and supervision with OT, modified independent with SLP. 8. Estimated rehab length of stay to reach the above functional goals is: 8-11 days 9. Anticipated D/C setting: Home 10. Anticipated post D/C treatments: HH therapy and Outpatient therapy 11. Overall  Rehab/Functional Prognosis: excellent  RECOMMENDATIONS: This patient's condition is appropriate for continued rehabilitative care in the following setting: CIR Patient has agreed to participate in recommended program. Yes Note that insurance prior authorization may be required for reimbursement for recommended care.  Comment: Rehab Admissions Coordinator to follow up.  Thanks,  Ranelle Oyster, MD, [place]  I have personally performed a face to face diagnostic evaluation of this patient. Additionally, I have reviewed and concur with the physician assistant's documentation above.     Mcarthur Rossetti Angiulli, PA-C 03/24/2018

## 2018-03-24 NOTE — Progress Notes (Signed)
Physical Therapy Treatment Patient Details Name: Pam Johnson Gangwer MRN: [id number] DOB: 11/05/1950 Today's Date: 03/24/2018    History of Present Illness Pam Johnson Maqueda is an 67 y.o. female with history of spinal stenosis, hypotension.  CT revealed a right occipital infarct. CTA was obtained which did show a left occluded internal carotid artery.  While in CT scanner patient was showing hypotensive with blood pressure with systolic reading in the 88    PT Comments    Patient progressing with hallway ambulation this session and able to further assess mobility challenges due to visual deficits.  Needed assist to guide walker straight and to avoid obstacles on R side.  She also loses tissue box on the L and with L UE difficulty managing toilet paper for perineal hygiene.  She remains appropriate for CIR level rehab upon d/c.   Follow Up Recommendations  Supervision/Assistance - 24 hour;CIR     Equipment Recommendations  None recommended by PT    Recommendations for Other Services       Precautions / Restrictions Precautions Precautions: Fall Precaution Comments: R field cut, L inattention    Mobility  Bed Mobility Overal bed mobility: Needs Assistance       Supine to sit: Supervision Sit to supine: Supervision   General bed mobility comments: assist for safety with lines with multiple cues for waiting until safe environment achieved  Transfers Overall transfer level: Needs assistance Equipment used: Rolling walker (2 wheeled) Transfers: Sit to/from Stand Sit to Stand: Mod assist         General transfer comment: assist for safety, lines, balance  Ambulation/Gait Ambulation/Gait assistance: Min assist Ambulation Distance (Feet): 70 Feet Assistive device: Rolling walker (2 wheeled) Gait Pattern/deviations: Step-through pattern;Decreased stride length;Drifts right/left     General Gait Details: difficulty managing walker straight pathway so assist for walker  management, assist for balance, pt fatigued in hallway    Stairs             Wheelchair Mobility    Modified Rankin (Stroke Patients Only) Modified Rankin (Stroke Patients Only) Pre-Morbid Rankin Score: Moderate disability Modified Rankin: Moderately severe disability     Balance Overall balance assessment: Needs assistance Sitting-balance support: Feet supported Sitting balance-Leahy Scale: Fair     Standing balance support: No upper extremity supported Standing balance-Leahy Scale: Poor Standing balance comment: washing hands at sink assist for balance                            Cognition Arousal/Alertness: Awake/alert Behavior During Therapy: Impulsive Overall Cognitive Status: Impaired/Different from baseline Area of Impairment: Attention;Problem solving;Awareness;Safety/judgement                   Current Attention Level: Selective     Safety/Judgement: Decreased awareness of safety;Decreased awareness of deficits Awareness: Emergent Problem Solving: Requires verbal cues;Requires tactile cues        Exercises      General Comments General comments (skin integrity, edema, etc.): toileted in bathroom with assist for managing paper, balance, etc      Pertinent Vitals/Pain Pain Assessment: Faces Faces Pain Scale: Hurts little more Pain Location: generalized mainly L wrist Pain Descriptors / Indicators: Aching;Sore Pain Intervention(s): Monitored during session;Repositioned    Home Living     Available Help at Discharge: Family;Available PRN/intermittently Type of Home: House              Prior Function  PT Goals (current goals can now be found in the care plan section) Progress towards PT goals: Progressing toward goals    Frequency    Min 4X/week      PT Plan Current plan remains appropriate    Co-evaluation              AM-PAC PT "6 Clicks" Daily Activity  Outcome Measure  Difficulty  turning over in bed (including adjusting bedclothes, sheets and blankets)?: A Little Difficulty moving from lying on back to sitting on the side of the bed? : A Little Difficulty sitting down on and standing up from a chair with arms (e.g., wheelchair, bedside commode, etc,.)?: Unable Help needed moving to and from a bed to chair (including a wheelchair)?: A Little Help needed walking in hospital room?: A Little Help needed climbing 3-5 steps with a railing? : A Lot 6 Click Score: 15    End of Session Equipment Utilized During Treatment: Gait belt Activity Tolerance: Patient tolerated treatment well Patient left: in bed;with call bell/phone within reach;with bed alarm set   PT Visit Diagnosis: Other abnormalities of gait and mobility (R26.89);Apraxia (R48.2);Other symptoms and signs involving the nervous system (R29.898)     Time: 1914-7829 PT Time Calculation (min) (ACUTE ONLY): 17 min  Charges:  $Gait Training: 8-22 mins                    G CodesSheran Lawless, [place] [phone] 03/24/2018    Elray Mcgregor 03/24/2018, 4:11 PM

## 2018-03-24 NOTE — Discharge Summary (Addendum)
Physician Discharge Summary  Patient ID: Pam Johnson MRN: [id number] DOB/AGE: 1951-03-29 67 y.o.  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 female with history of spinal stenosis, hypotension.  Per med rec patient is not on any antiplatelet.  She is neither on any anticoagulant.  Per husband patient was fine at 1205.  He left the room, upon entering the room he noted that she was flaccid on the left in addition she had a left facial droop.  He is also noted over the past week or 2 that she has been falling to the left and she has also been bumping into objects on the left.  Immediate CT was obtained secondary to code stroke which revealed a right occipital infarct that was well-demarcated and likely subacute within last couple weeks.  CTA was obtained which did show a left occluded internal carotid artery.  While in CT scanner patient was showing hypotensive with blood pressure with systolic reading in the 88.  Abdominal ultrasound was obtained however due to difficulty with gas in the belly it was hard to obtain a good study to look for an abdominal aortic aneurysm.  Due to patient having a significant neglect on the left patient was unable to do some of the exam.  At this time, patient will be brought to interventional radiology for further evaluation  Date last known well: Date: 03/22/2018 Time last known well: Time: 12:05 tPA Given: No: CVA within the last 3 months NIHSS 13 Modified Rankin: Rankin Score=0 Patient presented with significant flaccid left hemiplegia with left facial droop with-stroke scale of 13  underwent emergent CT  angiogram and perfusion scan. CT angiogram showed old ischemic right parietal infarct with occluded right ICA at the bulb with heavy calcification. CT perfusion showed significant penumbra in the right hemispherewith ischemic: 33 mL with some mismatch volume of 303 ML. Patient was taken for emergent revascularization by Dr. Corliss Skains on however he was unable to revascularize occluded right internal carotid artery. Patient actually did quite well and showed significant improvement in the left-sided strength. She had did have persistent left homonymous hemianopsia.transthoracic echo showed normal ejection fraction without cardiac source of embolism.LDL cholesterol was 44 mg percent and hemoglobin A1c was 5.6.urine drug screen was positive for opiates and cannabis.patient was initially kept in the intensive care unit where she continued to do well with significant improvement in her left hemiparesis but persistent visual field deficit.Patient was tightly controlled. She was mobilized out of bed and physical occupational speech therapy saw her. Patient was felt to be candidate for inpatient rehabilitation and she was seen at the rehabilitation team and felt to be medically stable and was transferred to inpatient rehabilitation on 03/24/18 when bed was available. Patient was counseled not to drive till the peripheral visual field improved and to quit marijuana.    Consults: rehabilitation medicine and  interventional neuroradiology  Significant Diagnostic Studies:   Code Stroke CT head moderate right occipital lobe infarct.  Old right frontal cortex and white matter infarct.    CTA head & neck R ICA occlusion beginning at the R common carotid bifurcation with reconstitution at the terminus.  Limited collateral pathways with fetal type R PCA  and hypoplastic R P1 and tiny ACom.  Severe proximal L subclavian stenosis.  Occlusion nondominant LV to with intracranial reconstitution.  Severe R VA origin stenosis.  L  ICA 80% proximally.  L common carotid 50%.  R common carotid origin 50%.  CT perfusion 33cc infarct R MCA/PCA and watershed area. Nearly R cerebral hemisphere in the penumbra range (336cc)  MRI could not tolerate  Cerebral angio L ICA stenosis, no intervention now, consider stent. Dr. Corliss Skainseveshwar following  Repeat CT head 5/30 read as mild evolution of right cytotoxic edema with core infarct doubling in size.  Questionable new white matter ischemia left hemisphere and interval development of left MCA branch thromboembolism.  Findings not supported by patient exam.  Dr. Pearlean BrownieSethi discussed with radiologist.  2D Echo  EF 55% no clot  LDL 44  HgbA1c 5.6    Discharge Exam: Blood pressure (!) 176/94, pulse 69, temperature 99.6 F (37.6 C), temperature source Oral, resp. rate 20, height 5\' 4"  (1.626 m), weight 113 lb 5.1 oz (51.4 kg), SpO2 98 %.  PHYSICAL EXAM   HEENT- Normocephalic, no lesions, without obvious abnormality. Normal external eye and conjunctiva.  Lungs-no rhonchi or wheezing noted, no excessive working breathing. Saturations within normal limits Extremities- Warm, dry and intact Musculoskeletal-no joint tenderness, deformity or swelling Skin-warm and dry, no hyperpigmentation, vitiligo, or suspicious lesions  Neurological Examination Mental Status: Alert and oriented x 3. Speech fluent without evidence of aphasia. Able to follow commands without difficulty. Cranial Nerves: ON:GEXBI:Left hemianopsia III,IV, VI: ptosis not present,EOMI. Pupils are equal round reactive to light. They are approximately 2 mm.  V,VII:face symmetric VIII: hearing normal bilaterally IX,X: uvula rises symmetrically XI: bilateral shoulder shrug XII: midline tongue extension Motor: Right :Upper extremity 5/5Left: Upper extremity 0/5 Lower extremity 5/5Lower extremity 2/5 Tone and  bulk:normal tone throughout; no atrophy noted Sensory:Sensation is intact  Plantars: Right: downgoingLeft: downgoing Cerebellar: No Dysmetria noted  Gait:Not tested    Disposition: Discharge disposition: 62-Rehab Facility         Home Discharge Medications   If d/c to Inpatient Rehab, Medications to to continued on Rehab .  stroke: mapping our early stages of recovery book   Does not apply Once  . aspirin EC  81 mg Oral Daily  . clopidogrel  75 mg Oral Daily  . heparin  5,000 Units Subcutaneous Q8H  . metoCLOPramide  5 mg Oral QID  . mirtazapine  30 mg Oral QHS  . mometasone-formoterol  2 puff Inhalation BID  . montelukast  10 mg Oral Daily  . multivitamin with minerals  1 tablet Oral Daily  . olopatadine  1 drop Both Eyes BID  . sertraline  150 mg Oral Daily  . tiotropium  18 mcg Inhalation Daily    Time spent on discharge summary 35 minutes   Signed: Delia Headyramod Sethi 03/24/2018, 3:26 PM

## 2018-03-24 NOTE — Care Management Note (Signed)
Case Management Note  Patient Details  Name: Pam Johnson MRN: [id number] Date of Birth: [birth year]  Subjective/Objective: Pt admitted on 03/22/18 with Rt occipital infarct.  PTA, pt independent, lives with spouse.                   Action/Plan: PT/OT recommending CIR, and consult completed.  Pt accepted to inpatient rehab for admission; plan dc to CIR today.  Expected Discharge Date:  03/24/18               Expected Discharge Plan:  IP Rehab Facility  In-House Referral:     Discharge planning Services  CM Consult  Post Acute Care Choice:    Choice offered to:     DME Arranged:    DME Agency:     HH Arranged:    HH Agency:     Status of Service:  Completed, signed off  If discussed at [employer], dates discussed:    Additional Comments:  Glennon Mac, RN 03/24/2018, 1:45 PM

## 2018-03-24 NOTE — Progress Notes (Signed)
Inpatient Rehabilitation  Met with patient and spouse at bedside to discuss team's recommendation for IP Rehab.  Shared booklets, insurance verification letter, and answered questions.  I have received medical clearance and have an IP Rehab bed available to offer today.  Patient and spouse in agreement with plan.  Will proceed with admission today.  Discussed with team.  Call if questions.    Carmelia Roller., CCC/SLP Admission Coordinator  [facility]  Cell [phone]

## 2018-03-24 NOTE — Progress Notes (Signed)
Went to round on patient in neuro ICU. Hx image-guided diagnostic cerebral angiogram with Dr. Corliss Skainseveshwar 03/22/2018.  Patient was using restroom when we arrived to her room. Nurse reports patient's condition stable at this time with no change in neuro exam.  Awaiting inpatient rehab consult. Continue working with PT/OT. Appreciate and agree with neurology management. IR to follow.  Waylan Bogalexandra M Cherrill Scrima, PA-C 03/24/2018, 9:44 AM

## 2018-03-25 ENCOUNTER — Inpatient Hospital Stay (HOSPITAL_COMMUNITY): Payer: Medicare Other | Admitting: Occupational Therapy

## 2018-03-25 ENCOUNTER — Inpatient Hospital Stay (HOSPITAL_COMMUNITY): Payer: Self-pay | Admitting: Physical Therapy

## 2018-03-25 ENCOUNTER — Inpatient Hospital Stay (HOSPITAL_COMMUNITY): Payer: Self-pay | Admitting: Occupational Therapy

## 2018-03-25 ENCOUNTER — Inpatient Hospital Stay (HOSPITAL_COMMUNITY): Payer: Self-pay

## 2018-03-25 DIAGNOSIS — G894 Chronic pain syndrome: Secondary | ICD-10-CM

## 2018-03-25 DIAGNOSIS — D62 Acute posthemorrhagic anemia: Secondary | ICD-10-CM

## 2018-03-25 DIAGNOSIS — I160 Hypertensive urgency: Secondary | ICD-10-CM

## 2018-03-25 DIAGNOSIS — I63511 Cerebral infarction due to unspecified occlusion or stenosis of right middle cerebral artery: Secondary | ICD-10-CM

## 2018-03-25 DIAGNOSIS — J449 Chronic obstructive pulmonary disease, unspecified: Secondary | ICD-10-CM

## 2018-03-25 LAB — CBC WITH DIFFERENTIAL/PLATELET
Abs Immature Granulocytes: 0 10*3/uL (ref 0.0–0.1)
Basophils Absolute: 0.1 10*3/uL (ref 0.0–0.1)
Basophils Relative: 1 %
EOS ABS: 0 10*3/uL (ref 0.0–0.7)
EOS PCT: 1 %
HEMATOCRIT: 40.4 % (ref 36.0–46.0)
Hemoglobin: 13.3 g/dL (ref 12.0–15.0)
Immature Granulocytes: 1 %
LYMPHS ABS: 1.5 10*3/uL (ref 0.7–4.0)
Lymphocytes Relative: 24 %
MCH: 29.6 pg (ref 26.0–34.0)
MCHC: 32.9 g/dL (ref 30.0–36.0)
MCV: 89.8 fL (ref 78.0–100.0)
MONOS PCT: 12 %
Monocytes Absolute: 0.8 10*3/uL (ref 0.1–1.0)
Neutro Abs: 3.8 10*3/uL (ref 1.7–7.7)
Neutrophils Relative %: 61 %
Platelets: 464 10*3/uL — ABNORMAL HIGH (ref 150–400)
RBC: 4.5 MIL/uL (ref 3.87–5.11)
RDW: 15.2 % (ref 11.5–15.5)
WBC: 6.3 10*3/uL (ref 4.0–10.5)

## 2018-03-25 LAB — COMPREHENSIVE METABOLIC PANEL
ALT: 11 U/L — ABNORMAL LOW (ref 14–54)
ANION GAP: 13 (ref 5–15)
AST: 18 U/L (ref 15–41)
Albumin: 3.3 g/dL — ABNORMAL LOW (ref 3.5–5.0)
Alkaline Phosphatase: 83 U/L (ref 38–126)
BILIRUBIN TOTAL: 0.5 mg/dL (ref 0.3–1.2)
BUN: <5 mg/dL — ABNORMAL LOW (ref 6–20)
CO2: 27 mmol/L (ref 22–32)
Calcium: 8.9 mg/dL (ref 8.9–10.3)
Chloride: 99 mmol/L — ABNORMAL LOW (ref 101–111)
Creatinine, Ser: 0.55 mg/dL (ref 0.44–1.00)
GFR calc Af Amer: >60 mL/min (ref >60)
GFR calc non Af Amer: >60 mL/min (ref >60)
Glucose, Bld: 108 mg/dL — ABNORMAL HIGH (ref 65–99)
POTASSIUM: 2.6 mmol/L — AB (ref 3.5–5.1)
Sodium: 139 mmol/L (ref 135–145)
TOTAL PROTEIN: 6.8 g/dL (ref 6.5–8.1)

## 2018-03-25 LAB — MAGNESIUM: MAGNESIUM: 1.6 mg/dL — AB (ref 1.7–2.4)

## 2018-03-25 MED ORDER — POTASSIUM CHLORIDE 10 MEQ/100ML IV SOLN
10.0000 meq | INTRAVENOUS | Status: AC
  Administered 2018-03-25 (×3): 10 meq via INTRAVENOUS
  Filled 2018-03-25 (×3): qty 100

## 2018-03-25 MED ORDER — HYDRALAZINE HCL 10 MG PO TABS
10.0000 mg | ORAL_TABLET | Freq: Three times a day (TID) | ORAL | Status: DC | PRN
  Administered 2018-03-26 – 2018-03-29 (×6): 10 mg via ORAL
  Filled 2018-03-25 (×6): qty 1

## 2018-03-25 MED ORDER — POTASSIUM CHLORIDE CRYS ER 20 MEQ PO TBCR
40.0000 meq | EXTENDED_RELEASE_TABLET | Freq: Every day | ORAL | Status: DC
  Administered 2018-03-25 – 2018-03-31 (×7): 40 meq via ORAL
  Filled 2018-03-25 (×7): qty 2

## 2018-03-25 MED ORDER — POTASSIUM CHLORIDE 10 MEQ/100ML IV SOLN
10.0000 meq | INTRAVENOUS | Status: DC
  Administered 2018-03-25 (×2): 10 meq via INTRAVENOUS
  Filled 2018-03-25 (×4): qty 100

## 2018-03-25 MED ORDER — POTASSIUM CHLORIDE 10 MEQ/100ML IV SOLN
10.0000 meq | INTRAVENOUS | Status: DC
Start: 2018-03-25 — End: 2018-03-25
  Filled 2018-03-25 (×5): qty 100

## 2018-03-25 NOTE — Progress Notes (Signed)
Occupational Therapy Session Note  Patient Details  Name: Pam Johnson Noviello MRN: [id number] Date of Birth: 11/03/1950  Today's Date: 03/25/2018 OT Individual Time: [id number] OT Individual Time Calculation (min): 38 min    Short Term Goals: Week 1:  OT Short Term Goal 1 (Week 1): STGs=LTGs due to ELOS  Skilled Therapeutic Interventions/Progress Updates:   Upon entering the room, pt returning to wheelchair with RN staff present to assist. Pt agreeable to OT intervention. OT assisted pt via wheelchair to [employer] gym. Pt on 2 L O2 via [place] as well as IV medication during this session. Pt engaged in standing dynavision task. Pt standing for 3 minutes total with steady assistance overall for balance and able to locate and hit 23 targets with 13 of them being located on L side. Pt is fastest locating targets on the right inferior half of board with average of 3 seconds and slowest on left inferior side with average of 11 seconds. Pt asking several questions regarding inpatient rehab expectations, OT goals, and POC with pt verbalizing understanding and agreement. Pt requesting to return to bed with steady assistance stand pivot transfer. Family member present in room and bed alarm activated. Call bell and all needed items within reach.    Therapy Documentation Precautions:  Precautions Precautions: Fall Precaution Comments: Lt inattention, visual deficits  Restrictions Weight Bearing Restrictions: No General: General PT Missed Treatment Reason: Nursing care(IV team) Vital Signs: Therapy Vitals Temp: 98.5 F (36.9 C) Temp Source: Oral Pulse Rate: 82 Resp: 18 BP: (!) 175/72 Patient Position (if appropriate): Lying Oxygen Therapy SpO2: 100 % O2 Device: Room Air Pain:   ADL: ADL ADL Comments: Please see functional navigator for ADL status Vision   Perception    Praxis Praxis: Intact Exercises:   Other Treatments:    See Function Navigator for Current Functional  Status.   Therapy/Group: Individual Therapy  Alen BleacherBradsher, Caidan Hubbert P 03/25/2018, 4:13 PM

## 2018-03-25 NOTE — Evaluation (Addendum)
Speech Language Pathology Assessment and Plan  Patient Details  Name: Pam Johnson MRN: [id number] Date of Birth: [birth year]  SLP Diagnosis: Dysphagia;Cognitive Impairments  Rehab Potential: Good ELOS: 8-11 days     Today's Date: 03/25/2018 SLP Individual Time: 1400-1500 SLP Individual Time Calculation (min): 60 min   Problem List:  Patient Active Problem List   Diagnosis Date Noted  . Hypertensive urgency   . Acute blood loss anemia   . Chronic obstructive pulmonary disease ([place])   . Chronic pain syndrome   . Acute ischemic right MCA stroke ([place]) 03/24/2018  . 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 Essential hypertension   . Stroke (cerebrum) ([place]) 03/22/2018  . 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 Pneumonia 10/18/2015   Past Medical History:  Past Medical History:  Diagnosis Date  . Acid reflux   . COPD (chronic obstructive pulmonary disease) ([place])   . Hypertension   . Sacral fracture ([place])   . Spinal stenosis    Past Surgical History:  Past Surgical History:  Procedure Laterality Date  . ABDOMINAL HYSTERECTOMY    . CHOLECYSTECTOMY    . HERNIA REPAIR    . IR ANGIO INTRA EXTRACRAN SEL COM CAROTID INNOMINATE BILAT MOD SED  03/22/2018  . IR ANGIO VERTEBRAL SEL VERTEBRAL UNI R MOD SED  03/22/2018  . IR CT HEAD LTD  03/22/2018  . IR PERCUTANEOUS ART THROMBECTOMY/INFUSION INTRACRANIAL INC DIAG ANGIO  03/22/2018  . NISSEN FUNDOPLICATION    . RADIOLOGY WITH ANESTHESIA N/A 03/22/2018   Procedure: IR WITH ANESTHESIA;  Surgeon: Luanne Bras, MD;  Location: [place];  Service: Radiology;  Laterality: N/A;  . TOTAL HIP ARTHROPLASTY Right 11/2017    Assessment / Plan / Recommendation Clinical Impression Pam Johnson is a 67 year old female with history of HTN, chronic pain due to spinal stenosis (Dr. Michail Sermon) , COPD, recent fall who was admitted on 03/22/18 with left sided weakness, tendency to run into things on the left for few days and facial droop.  UDS positive for THC and opiates. 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 She underwent CTA angio with unsuccessful revascularization of acutely occluded prox R-ICA. She had hypotensive episode while in CT scanner and was treated with fluid bolus. Repeat CT head showed mild evolution with right cytotoxic edema and right hemisphere core infarct perhaps doubled in size and interval development of L-MCA thromboembolism --finding not supported by exam per neuro. Dr. Leonie Man recommends ASA/plavix for 3 months followed by ASA alone.  MRI brain not done due to claustrophobia.  2D echo done today  Patient with resultant left sided weakness, left inattention and difficulty performing tasks. CIR recommended due to functional deficits.  Pt presents with moderate impairments in higher level cognitive linguistic skills in semi-complex problem solving, error awareness, and selective attention, supported by subsections of ALFA medication management and MOCA blind 26 out 30 indicating within functional limits for basic cognition. Pt's problem solving ability is largely impacted by visual deficit, only seeing midline and impaired focus, per pt report. Pt presents with mild oral impairment in swallow function due to inefficient mastication of dys 3 and regular textured foods, due to improper fitting dentures and oral sores. SLP recommends dys 2 and thin liquid diet  at this time.  Pt would benefit from skilled ST services in order to maximize functional independence and reduce burden of care prior to discharge.    Skilled Therapeutic Interventions          Skilled ST services focused on cognitive skills. SLP facilitated  semi-complex problem solving providing moderate verbal cues during medication management task, which is further impacted by visual deficits.Daughter states some attention deficits are baseline due to internal distractions. Pt was left in room with call bell within reach and daughter present. Reccomend to continue skilled ST services.    SLP Assessment  Patient will need skilled Speech Lanaguage Pathology Services during CIR admission    Recommendations  SLP Diet Recommendations: Thin;Dysphagia 2 (Fine chop)(due to oral sores) Liquid Administration via: Cup;Straw Medication Administration: Whole meds with liquid Supervision: Patient able to self feed Compensations: Slow rate;Small sips/bites Postural Changes and/or Swallow Maneuvers: Seated upright 90 degrees;Upright 30-60 min after meal Oral Care Recommendations: Oral care BID Patient destination: Home Follow up Recommendations: 24 hour supervision/assistance;Outpatient SLP;Home Health SLP Equipment Recommended: None recommended by SLP    SLP Frequency 3 to 5 out of 7 days   SLP Duration  SLP Intensity  SLP Treatment/Interventions 8-11 days   Minumum of 1-2 x/day, 30 to 90 minutes  Cognitive remediation/compensation;Cueing hierarchy;Dysphagia/aspiration precaution training;Functional tasks;Medication managment    Pain Pain Assessment Pain Score: 0-No pain  Prior Functioning Cognitive/Linguistic Baseline: Within functional limits Type of Home: House  Lives With: Spouse Available Help at Discharge: Family;Available 24 hours/day Vocation: Retired  Function:  Eating Eating   Modified Consistency Diet: Yes Eating Assist Level: More than reasonable amount of time           Cognition Comprehension Comprehension assist level: Follows basic conversation/direction with extra time/assistive device  Expression   Expression assist level: Expresses basic needs/ideas: With extra time/assistive device  Social Interaction Social  Interaction assist level: Interacts appropriately with others with medication or extra time (anti-anxiety, antidepressant).  Problem Solving Problem solving assist level: Solves basic problems with no assist  Memory Memory assist level: Recognizes or recalls 90% of the time/requires cueing < 10% of the time   Short Term Goals: Week 1: SLP Short Term Goal 1 (Week 1): Pt will consume dys 3 trials with efficient mastication and no overt s/s aspiration to demonstrate readiness of diet advancement, SLP Short Term Goal 2 (Week 1): Pt will demonstrate selective attention in moderately distracting environment for 45 minutes with min A verbal cues. SLP Short Term Goal 3 (Week 1): Pt will demonstrate functional problem solving for basic and familar (mildly complex) tasks with min A verbal cues. SLP Short Term Goal 4 (Week 1): Pt will demonstarte compensatory strategies for visual deficits during functional problem solving tasks with min A verbal cues., SLP Short Term Goal 5 (Week 1): Pt will self-monitor and correct functional errors with min A verbal cues.   Refer to Care Plan for Long Term Goals  Recommendations for other services: None   Discharge Criteria: Patient will be discharged from SLP if patient refuses treatment 3 consecutive times without medical reason, if treatment goals not met, if there is a change in medical status, if patient makes no progress towards goals or if patient is discharged from hospital.  The above assessment, treatment plan, treatment alternatives and goals were discussed and mutually agreed upon: by patient and by family  Pam Johnson  [facility] 03/25/2018, 6:28 PM

## 2018-03-25 NOTE — Progress Notes (Signed)
CRITICAL VALUE ALERT  Critical Value:  K 2.6  Date & Time Notied:  03/25/18- 1012  Provider Notified: MDPatel  Orders Received/Actions taken: Orders received-runs of K+, oral K+, labs

## 2018-03-25 NOTE — Plan of Care (Signed)
LTGs established 03/25/18

## 2018-03-25 NOTE — Progress Notes (Signed)
[place] PHYSICAL MEDICINE & REHABILITATION     PROGRESS NOTE  Subjective/Complaints:  Patient seen lying in bed this morning. She states she did not sleep well overnight because she was restless. Discussed elevated blood pressures with nursing.  ROS: denies CP, SOB, nausea, vomiting, diarrhea.  Objective: Vital Signs: Blood pressure (!) 188/90, pulse (!) 54, temperature 98.2 F (36.8 C), temperature source Oral, resp. rate (!) 21, height 5\' 4"  (1.626 m), weight 51.4 kg (113 lb 5.1 oz), SpO2 96 %. Dg Chest 2 View  Result Date: 03/23/2018 CLINICAL DATA:  67 year old female status post right ICA large vessel occlusion with cerebral infarct. Cough and shortness of breath. Aspiration. EXAM: CHEST - 2 VIEW COMPARISON:  01/27/2016 chest radiographs and earlier. FINDINGS: Upright AP and lateral views of the chest at 1054 hours. Stable large lung volumes. No pneumothorax, pulmonary edema or consolidation. Small bilateral pleural effusions suspected. Chronic hiatal hernia suspected. Other mediastinal contours are within normal limits. Calcified aortic atherosclerosis. Visualized tracheal air column is within normal limits. Chronic lower thoracic augmented compression fracture. Partially visible right proximal humerus ORIF hardware. Stable visualized osseous structures. Negative visible bowel gas pattern. IMPRESSION: 1. Probable small bilateral pleural effusions. 2. No other acute cardiopulmonary abnormality identified. 3. Chronic hiatal hernia suspected. 4.  Aortic Atherosclerosis (ICD10-I70.0). Electronically Signed   By: Odessa Fleming M.D.   On: 03/23/2018 10:52   Ct Head Wo Contrast  Addendum Date: 03/23/2018   ADDENDUM REPORT: 03/23/2018 13:12 ADDENDUM: Study discussed by telephone with Neurology Dr. Pearlean Brownie on 03/23/2018 at 1304 hours. He advises that clinically the patient is doing well, with improved deficits since presentation and no known new or right side deficits. This argues against acute ischemia in  the left hemisphere. The patient was unable to tolerate Brain MRI earlier today. Electronically Signed   By: Odessa Fleming M.D.   On: 03/23/2018 13:12   Result Date: 03/23/2018 CLINICAL DATA:  67 year old female status post right ICA large vessel occlusion. Unsuccessful revascularization. Right anterior frontal lobe, right parietal and occipital lobe, and watershed areas of core infarct suspected at presentation. EXAM: CT HEAD WITHOUT CONTRAST TECHNIQUE: Contiguous axial images were obtained from the base of the skull through the vertex without intravenous contrast. COMPARISON:  CTA and CT perfusion plus presentation noncontrast head CT 03/22/2018. FINDINGS: Brain: Mildly enlarged and progressed areas of cytotoxic edema in the right parietal and occipital lobes since the presentation CT 1304 hours yesterday (series 3, image 19). Similar mild patchy increased cytotoxic edema related hypodensity in the right superior frontal lobe (images 22-24). No associated hemorrhage or mass effect. However, at the same time there is new confluent white matter hypodensity in the left corona radiata (series 3, image 18), tracking to the deep white matter capsules. The left M2 SIL a and basal ganglia appear to remain stable. Questionable new hypodensity at the lower left thalamus (image 13). Elsewhere gray-white matter differentiation appears to remain stable. No intracranial mass effect. No intracranial hemorrhage or ventriculomegaly. Vascular: Calcified atherosclerosis at the skull base. New calcified density in the left sylvian fissure on series 3, image 13 suggesting interval left MCA branch plaque thromboembolism. Skull: No acute osseous abnormality identified. Sinuses/Orbits: Visualized paranasal sinuses and mastoids are stable and generally well pneumatized. Other: No acute orbit or scalp soft tissue findings. IMPRESSION: 1. Comparatively mild evolution of right hemisphere cytotoxic edema since 1304 hours yesterday considering the  CTA/CTP findings at presentation. The right hemisphere core infarct size has perhaps doubled since that time. 2. Questionable  new white matter ischemia in the left hemisphere. And there is evidence of interval left MCA branch thromboembolism (series 3, image 13). 3. No intracranial hemorrhage or mass effect. Electronically Signed: By: Odessa Fleming M.D. On: 03/23/2018 13:01   Dg Hand 2 View Right  Result Date: 03/23/2018 CLINICAL DATA:  Acute onset of dorsal right hand bruising and pain. Initial encounter. EXAM: RIGHT HAND - 2 VIEW COMPARISON:  None. 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 Mild degenerative change is noted at the first carpometacarpal joint, and there is mild underlying osseous fragmentation. Diffuse soft tissue swelling is noted about the wrist and dorsum of the hand. Remaining visualized joint spaces are grossly unremarkable. There is no definite evidence of fracture. IMPRESSION: 1. 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 This is of uncertain etiology. Would correlate with the patient's history. 2. Mild degenerative change at the first carpometacarpal joint, with mild underlying osseous fragmentation. 3. Diffuse soft tissue swelling about the wrist and dorsum of the hand. Electronically Signed   By: Roanna Raider M.D.   On: 03/23/2018 22:39   Recent Labs    03/22/18 1317 03/23/18 0425  WBC 7.4 5.5  HGB 12.8 10.3*  HCT 39.1 31.6*  PLT 460* 383   Recent Labs    03/22/18 1317 03/23/18 0420  NA 137 141  K 3.9 3.7  CL 100* 110  GLUCOSE 115* 97  BUN 8 <5*  CREATININE 0.99 0.48  CALCIUM 8.8* 7.7*   CBG (last 3)  No results for input(s): GLUCAP in the last 72 hours.  Wt Readings from Last 3 Encounters:   03/24/18 51.4 kg (113 lb 5.1 oz)  03/22/18 51.4 kg (113 lb 5.1 oz)  01/27/16 51.3 kg (113 lb)    Physical Exam:  BP (!) 188/90 (BP Location: Right Leg)   Pulse (!) 54   Temp 98.2 F (36.8 C) (Oral)   Resp (!) 21   Ht 5\' 4"  (1.626 m)   Wt 51.4 kg (113 lb 5.1 oz)   SpO2 96%   BMI 19.45 kg/m  Constitutional: No distress. Nasal cannula in place. Frail appearing  HENT: Ecchymotic areas on left forehead (with induration and scabbing) and bilateral cheeks.  Eyes: EOMI. No discharge. Cardiovascular: Normal rate and regular rhythm. No JVD. Respiratory: No respiratory distress. Barrel chested. Intermittent wheezing.  Neurological: alert and oriented. Fair insight and awareness.  Motor: LUE 4+/5 prox to distal. LLE 4/5 proximally, 4+/5 distally RUE/RLE 5/5.  Skin: She is not diaphoretic. BUE with multiple healed skin tears and ecchymotic areas. Bilateral shins with multiple petechial areas (due to trauma)  Psychiatric: She has a normal mood and affect. Her behavior is normal.   Assessment/Plan: 1. Functional deficits secondary to right acute/subacute parietal-occipital lobe infarct which require 3+ hours per day of interdisciplinary therapy in a comprehensive inpatient rehab setting. Physiatrist is providing close team supervision and 24 hour management of active medical problems listed below. Physiatrist and rehab team continue to assess barriers to discharge/monitor patient progress toward functional and medical goals.  Function:  Bathing Bathing position      Bathing parts      Bathing assist        Upper Body Dressing/Undressing Upper body dressing  Upper body assist        Lower Body Dressing/Undressing Lower body dressing                                  Lower body assist        Toileting Toileting   Toileting steps completed by patient: Adjust clothing prior to toileting, Performs perineal hygiene, Adjust clothing after  toileting   Toileting Assistive Devices: Grab bar or rail  Toileting assist Assist level: Touching or steadying assistance (Pt.75%)   Transfers Chair/bed transfer             Locomotion Ambulation           Wheelchair          Cognition Comprehension Comprehension assist level: Follows basic conversation/direction with no assist  Expression Expression assist level: Expresses basic needs/ideas: With no assist  Social Interaction Social Interaction assist level: Interacts appropriately with others with medication or extra time (anti-anxiety, antidepressant).  Problem Solving Problem solving assist level: Solves basic problems with no assist  Memory Memory assist level: Recognizes or recalls 90% of the time/requires cueing < 10% of the time    Medical Problem List and Plan:  1. Functional and mobility deficits secondary to right acute/subacute parietal-occipital lobe infarct on 5/29  Begin CIR   Notes reviewed, images reviewed (left MCA occlusion not clinically correlated), labs reviewed 2. DVT Prophylaxis/Anticoagulation: Pharmaceutical: Lovenox  3. Chronic LBP pain/Pain Management: Was on Oxycontin 20 mg tid/ Lyrica tid with hydrocodone qid prn.   Resumed OxyContin at 10 mg bid and monitor for tolerance.  Continue hydrocodone prn  4. Mood: LCSW to follow for evaluation and support.  5. Neuropsych: This patient is capable of making decisions on her own behalf.  6. Skin/Wound Care: routine pressure relief measures.  7. Fluids/Electrolytes/Nutrition: Monitor I/O.    Labs pending 8. COPD: Last exacerbation 12/2017. Continue Dulera bid, Singulair and spiriva  9. HTN: Monitor BP bid. 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   On Norvasc 2.5 PTA   Hydralazine when necessary  Permissive hypertension at present   Monitor with increased mobility, hypertensive urgency overnight 10. 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 Tobacco abuse: Continue to encourage cessation. Added  nicotine patch.  12. H/o anxiety disorder: On Remeron and Zoloft daily.  13. Advanced OA right wrist: Continue splint with local measures. 14. Acute blood loss anemia   Hemoglobin 10.3 on 5/30   Labs pending  LOS (Days) 1 A FACE TO FACE EVALUATION WAS PERFORMED  Nassim Cosma Karis Jubanil Dawit Tankard 03/25/2018 8:11 AM

## 2018-03-25 NOTE — Evaluation (Signed)
Occupational Therapy Assessment and Plan  Patient Details  Name: Pam Johnson MRN: [id number] Date of Birth: 1951/08/28  OT Diagnosis: hemiplegia affecting dominant side, muscle weakness (generalized) and coordination disorder Rehab Potential: Rehab Potential (ACUTE ONLY): Excellent ELOS: 7-10 days   Today's Date: 03/25/2018 OT Individual Time: 7824-2353 OT Individual Time Calculation (min): 62 min     Problem List:  Patient Active Problem List   Diagnosis Date Noted  . Hypertensive urgency   . Acute blood loss anemia   . Chronic obstructive pulmonary disease ([place])   . Chronic pain syndrome   . Acute ischemic right MCA stroke ([place]) 03/24/2018  . 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 Essential hypertension   . Stroke (cerebrum) ([place]) 03/22/2018  . 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 Pneumonia 10/18/2015    Past Medical History:  Past Medical History:  Diagnosis Date  . Acid reflux   . COPD (chronic obstructive pulmonary disease) ([place])   . Hypertension   . Sacral fracture ([place])   . Spinal stenosis    Past Surgical History:  Past Surgical History:  Procedure Laterality Date  . ABDOMINAL HYSTERECTOMY    . CHOLECYSTECTOMY    . HERNIA REPAIR    . IR ANGIO INTRA EXTRACRAN SEL COM CAROTID INNOMINATE BILAT MOD SED  03/22/2018  . IR ANGIO VERTEBRAL SEL VERTEBRAL UNI R MOD SED  03/22/2018  . IR CT HEAD LTD  03/22/2018  . IR PERCUTANEOUS ART THROMBECTOMY/INFUSION INTRACRANIAL INC DIAG ANGIO  03/22/2018  . NISSEN FUNDOPLICATION    . RADIOLOGY WITH ANESTHESIA N/A 03/22/2018   Procedure: IR WITH ANESTHESIA;  Surgeon: Luanne Bras, MD;  Location: [facility];  Service: Radiology;  Laterality: N/A;  . TOTAL HIP ARTHROPLASTY Right 11/2017    Assessment & Plan Clinical Impression: Pam Johnson is a 67 year old female with history of HTN, chronic pain due to spinal stenosis (Dr. Michail Sermon) , COPD, recent fall who was admitted on 03/22/18 with left sided weakness,  tendency to run into things on the left for few days and facial droop. UDS positive for THC and opiates. 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 She underwent CTA angio with unsuccessful revascularization of acutely occluded prox R-ICA. She had hypotensive episode while in CT scanner and was treated with fluid bolus. Repeat CT head showed mild evolution with right cytotoxic edema and right hemisphere core infarct perhaps doubled in size and interval development of L-MCA thromboembolism --finding not supported by exam per neuro. Dr. Leonie Man recommends ASA/plavix for 3 months followed by ASA alone.  MRI brain not done due to claustrophobia.  2D echo done today  Patient with resultant left sided weakness, left inattention and difficulty performing tasks. CIR recommended due to functional deficits.   Patient currently requires min with basic self-care skills secondary to muscle weakness, decreased cardiorespiratoy endurance, decreased coordination, decreased visual acuity and field cut, decreased attention to left and left side neglect, decreased attention, decreased awareness, decreased problem solving, decreased safety awareness and decreased memory and decreased standing balance and hemiplegia.  Prior to hospitalization, patient could complete BADLs with supervision.  Patient will benefit from skilled intervention to increase independence with basic self-care skills prior to discharge home with spouse.  Anticipate patient will require  24 hour supervision and follow up home health.  OT - End of Session Endurance Deficit: Yes OT Assessment Rehab Potential (ACUTE ONLY): Excellent OT Barriers to  Discharge: Medical stability;New oxygen OT Patient demonstrates impairments in the following area(s): Balance;Behavior;Perception;Cognition;Safety;Sensory;Endurance;Motor;Vision OT Basic ADL's Functional Problem(s): Eating;Grooming;Bathing;Dressing;Toileting OT Advanced ADL's Functional Problem(s): Simple Meal Preparation OT Transfers Functional Problem(s): Toilet;Tub/Shower OT Additional Impairment(s): Fuctional Use of Upper Extremity OT Plan OT Intensity: Minimum of 1-2 x/day, 45 to 90 minutes OT Frequency: 5 out of 7 days OT Duration/Estimated Length of Stay: 7-10 days OT Treatment/Interventions: Balance/vestibular training;Discharge planning;Functional electrical stimulation;Pain management;Therapeutic Activities;UE/LE Coordination activities;Visual/perceptual remediation/compensation;Therapeutic Exercise;Skin care/wound managment;Patient/family education;Functional mobility training;Disease mangement/prevention;Cognitive remediation/compensation;Community reintegration;DME/adaptive equipment instruction;Neuromuscular re-education;Splinting/orthotics;Psychosocial support;UE/LE Strength taining/ROM;Wheelchair propulsion/positioning OT Self Feeding Anticipated Outcome(s): Supervision/setup OT Basic Self-Care Anticipated Outcome(s): Supervision/cuing  OT Toileting Anticipated Outcome(s): Supervision/setup OT Bathroom Transfers Anticipated Outcome(s): Supervision/cuing  OT Recommendation Recommendations for Other Services: Speech consult Patient destination: Home Follow Up Recommendations: Home health OT;24 hour supervision/assistance Equipment Recommended: Tub/shower bench;To be determined   Skilled Therapeutic Intervention  Skilled OT session completed with focus on initial evaluation, education on OT role/POC, and establishment of patient-centered goals.   Pt greeted supine in bed with spouse present. On 2L supplemental 02. Once she received inhaler from RN, pt was agreeable to complete  B/D at shower level. Throughout session, she completed functional transfers at ambulatory level using RW and Min A. Pt required cues for locating all transfer surfaces due to visual deficits. When bathing at sit<stand level, pt exhibited undershooting/overshooting when reaching for ADL items. Pt with Lt neglect dressing EOB, great difficultly locating clothing items and grooming products placed on Lt side. Cues also required for 02 mgt. She completed all ADL tasks with steady assist at sit<stand level overall. Pt with anxious behaviors during session and responded well to calming cues. At end of session pt was left in recliner with chair alarm set. She was left with all needs within reach and spouse present.     OT Evaluation Precautions/Restrictions  Precautions Precautions: Fall Precaution Comments: Lt inattention, visual deficits  Restrictions Weight Bearing Restrictions: No General Chart Reviewed: Yes Family/Caregiver Present: Yes(spouse) Vital Signs Oxygen Therapy SpO2: 97 % O2 Device: Nasal Cannula O2 Flow Rate (L/min): 3 L/min Pain No c/o pain during session    Home Living/Prior [place] expects to be discharged to:: Private residence Living Arrangements: Spouse/significant other Available Help at Discharge: Family, Available 24 hours/day Type of Home: House Home Layout: Two level, Able to live on main level with bedroom/bathroom Bathroom Shower/Tub: Chiropodist: Standard(toilet risers with handles) Bathroom Accessibility: Yes  Lives With: Spouse IADL History Homemaking Responsibilities: No(Spouse assists with majority of ADLs, pt has lunch prep responsibilities) Occupation: Retired Type of Occupation: Product/process development scientist  Leisure and Hobbies: Cooking + cleaning  Prior Function Level of Independence: Requires assistive device for independence, Independent with basic ADLs, Independent with transfers(Spouse provided  supervision assist for ADLs) ADL ADL ADL Comments: Please see functional navigator for ADL status Vision Baseline Vision/History: Wears glasses Wears Glasses: At all times Patient Visual Report: Eye fatigue/eye pain/headache;Blurring of vision Vision Assessment?: Vision impaired- to be further tested in functional context Perception  Perception: Impaired Inattention/Neglect: Does not attend to left visual field;Does not attend to left side of body Praxis Praxis: Intact Cognition Overall Cognitive Status: Impaired/Different from baseline Arousal/Alertness: Awake/alert Orientation Level: Person;Place;Situation Person: Oriented Place: Oriented Situation: Oriented Year: 2019(First response, "1019" until she corrected herself) Month: June Day of Week: Correct Memory: Impaired Immediate  Memory Recall: Sock;Blue;Bed Memory Recall: Sock;Blue;Bed Memory Recall Sock: Without Cue Memory Recall Blue: With Cue Memory Recall Bed: Without Cue Attention: Sustained;Divided Divided Attention: Impaired Awareness: Appears intact Problem Solving: Impaired Safety/Judgment: Appears intact Sensation Sensation Light Touch: Appears Intact(She reports numbness/tingling in fingertips at baseline) Stereognosis: Not tested Hot/Cold: Appears Intact Proprioception: Appears Intact Coordination Gross Motor Movements are Fluid and Coordinated: Yes Fine Motor Movements are Fluid and Coordinated: No(Pt dropped multiple items during grooming task completion when standing at sink, required extra time and assist to meet FM demands) Motor  Motor Motor: Other (comment);Hemiplegia Motor - Skilled Clinical Observations: Mild Lt hemi, generalized weakness  Mobility  Transfers Transfers: Sit to Stand;Stand to Sit Sit to Stand: From toilet;4: Min assist Sit to Stand Details: Verbal cues for precautions/safety;Verbal cues for safe use of DME/AE;Verbal cues for technique Stand to Sit: 4: Min assist;To  toilet Stand to Sit Details (indicate cue type and reason): Verbal cues for precautions/safety;Verbal cues for safe use of DME/AE;Verbal cues for technique  Trunk/Postural Assessment  Cervical Assessment Cervical Assessment: Exceptions to WFL(Forward head) Thoracic Assessment Thoracic Assessment: Exceptions to WFL(Rounded shoulders) Lumbar Assessment Lumbar Assessment: Exceptions to WFL(posterior pelvic tilt) Postural Control Postural Control: Within Functional Limits  Balance Balance Balance Assessed: Yes Dynamic Sitting Balance Dynamic Sitting - Balance Support: No upper extremity supported;During functional activity Dynamic Sitting - Level of Assistance: 5: Stand by assistance Dynamic Sitting Balance - Compensations: donning pants EOB Dynamic Standing Balance Dynamic Standing - Balance Support: No upper extremity supported;During functional activity Dynamic Standing - Level of Assistance: 4: Min assist Dynamic Standing - Balance Activities: Lateral lean/weight shifting;Forward lean/weight shifting;Reaching for objects Dynamic Standing - Comments: Standing in shower during pericare completion  Extremity/Trunk Assessment RUE Assessment RUE Assessment: Within Functional Limits(4+/5 proximal/distal) LUE Assessment LUE Assessment: Exceptions to WFL(3/5 proximally, 4+/5 distally)   See Function Navigator for Current Functional Status.   Refer to Care Plan for Long Term Goals  Recommendations for other services: Other: SLP   Discharge Criteria: Patient will be discharged from OT if patient refuses treatment 3 consecutive times without medical reason, if treatment goals not met, if there is a change in medical status, if patient makes no progress towards goals or if patient is discharged from hospital.  The above assessment, treatment plan, treatment alternatives and goals were discussed and mutually agreed upon: by patient and by family  Skeet Simmer 03/25/2018, 10:56 AM

## 2018-03-25 NOTE — Evaluation (Signed)
Physical Therapy Assessment and Plan  Patient Details  Name: Pam Johnson MRN: [id number] Date of Birth: 1951/10/16  PT Diagnosis: Ataxic gait, Difficulty walking and Muscle weakness Rehab Potential: Good ELOS: 7-9 days   Today's Date: 03/25/2018 PT Individual Time: 1120-1205 PT Individual Time Calculation (min): 45 min    Problem List:  Patient Active Problem List   Diagnosis Date Noted  . Hypertensive urgency   . Acute blood loss anemia   . Chronic obstructive pulmonary disease ([place])   . Chronic pain syndrome   . Acute ischemic right MCA stroke ([place]) 03/24/2018  . 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 Essential hypertension   . Stroke (cerebrum) ([place]) 03/22/2018  . 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 Pneumonia 10/18/2015    Past Medical History:  Past Medical History:  Diagnosis Date  . Acid reflux   . COPD (chronic obstructive pulmonary disease) ([place])   . Hypertension   . Sacral fracture ([place])   . Spinal stenosis    Past Surgical History:  Past Surgical History:  Procedure Laterality Date  . ABDOMINAL HYSTERECTOMY    . CHOLECYSTECTOMY    . HERNIA REPAIR    . IR ANGIO INTRA EXTRACRAN SEL COM CAROTID INNOMINATE BILAT MOD SED  03/22/2018  . IR ANGIO VERTEBRAL SEL VERTEBRAL UNI R MOD SED  03/22/2018  . IR CT HEAD LTD  03/22/2018  . IR PERCUTANEOUS ART THROMBECTOMY/INFUSION INTRACRANIAL INC DIAG ANGIO  03/22/2018  . NISSEN FUNDOPLICATION    . RADIOLOGY WITH ANESTHESIA N/A 03/22/2018   Procedure: IR WITH ANESTHESIA;  Surgeon: Luanne Bras, MD;  Location: [facility];  Service: Radiology;  Laterality: N/A;  . TOTAL HIP ARTHROPLASTY Right 11/2017    Assessment & Plan Clinical Impression: Pam Johnson is a 67 year old female with history of HTN, chronic pain due to spinal stenosis (Dr. Michail Sermon) , COPD, recent fall who was admitted on 03/22/18 with left sided weakness, tendency to run into things on the left for few days and facial droop. UDS positive  for THC and opiates. 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 She underwent CTA angio with unsuccessful revascularization of acutely occluded prox R-ICA. She had hypotensive episode while in CT scanner and was treated with fluid bolus. Repeat CT head showed mild evolution with right cytotoxic edema and right hemisphere core infarct perhaps doubled in size and interval development of L-MCA thromboembolism --finding not supported by exam per neuro. Dr. Leonie Man recommends ASA/plavix for 3 months followed by ASA alone.  MRI brain not done due to claustrophobia.  2D echo done today  Patient with resultant left sided weakness, left inattention and difficulty performing tasks. CIR recommended due to functional deficits. Patient transferred to CIR on 03/24/2018 .   Patient currently requires min with mobility secondary to muscle weakness, decreased cardiorespiratoy endurance, decreased coordination, decreased visual perceptual skills and field cut and decreased standing balance, decreased postural control, hemiplegia and decreased balance strategies.  Prior to hospitalization, patient was modified independent  with mobility and lived with Spouse in a House home.  Home access is 1 threshold step.  Patient will benefit from skilled PT intervention to maximize safe functional mobility, minimize fall risk and decrease caregiver burden for planned discharge home with 24  hour supervision.  Anticipate patient will benefit from follow up OP at discharge.  PT - End of Session Activity Tolerance: Tolerates 10 - 20 min activity with multiple rests Endurance Deficit: Yes Endurance Deficit Description: fatigues quickly  with mobility activities PT Assessment Rehab Potential (ACUTE/IP ONLY): Good PT Barriers to Discharge: Other (comments) PT Barriers to Discharge Comments: visual impairments PT Patient demonstrates impairments in the following area(s): Balance;Endurance;Motor;Perception;Safety PT Transfers Functional Problem(s): Bed Mobility;Bed to Chair;Car;Furniture PT Locomotion Functional Problem(s): Ambulation;Stairs PT Plan PT Intensity: Minimum of 1-2 x/day ,45 to 90 minutes PT Frequency: 5 out of 7 days PT Duration Estimated Length of Stay: 7-9 days PT Treatment/Interventions: Ambulation/gait training;Discharge planning;Functional mobility training;Psychosocial support;Therapeutic Activities;Visual/perceptual remediation/compensation;Therapeutic Exercise;Skin care/wound management;Neuromuscular re-education;Disease management/prevention;Balance/vestibular training;Cognitive remediation/compensation;DME/adaptive equipment instruction;Pain management;Splinting/orthotics;UE/LE Strength taining/ROM;UE/LE Coordination activities;Stair training;Patient/family education;Community reintegration PT Transfers Anticipated Outcome(s): modI PT Locomotion Anticipated Outcome(s): S overall with LRAD PT Recommendation Recommendations for Other Services: Therapeutic Recreation consult Therapeutic Recreation Interventions: Pet therapy;Kitchen group;Outing/community reintergration Follow Up Recommendations: Outpatient PT Patient destination: Home Equipment Recommended: None recommended by PT Equipment Details: has RW, rollator, SPC at home  Skilled Therapeutic Intervention Pt received supine in bed with husband present; missed 15 min of session d/t IV team working with pt. Upon therapist's arrival pt agreeable to treatment, denies pain but reports uncomfortable in her back d/t being in bed for a long time. PT initial evaluation performed and completed with min guard/minA overall as described below with assist need for  minor LOBs/unsteadiness, mod/max cues for safety and awareness of environment d/t visual field cuts. With vision assessment, pt appears to have significant visual field cuts resulting in relative tunnel vision; also noted functionally with pt frequently driving walker into obstacles with poor awareness and requires max cueing for management of RW and safety. Educated pt and husband extensively regarding compensatory head turns/scanning to improve awareness; husband reports pt as always been "fast moving" and it has been difficult to slow her down and increase her awareness of deficits. Educated pt and husband on rehab process, goals, estimated LOS and falls prevention safety; both agreeable to all the above. Remained seated in recliner with husband present; NT alerted to chair alarm in place but requires new batteries.   PT Evaluation Precautions/Restrictions Precautions Precautions: Fall Precaution Comments: Lt inattention, visual deficits  Restrictions Weight Bearing Restrictions: No General PT Amount of Missed Time (min): 20 Minutes PT Missed Treatment Reason: Nursing care(IV team)  Pain  Denies pain Home Living/Prior Functioning Home Living Available Help at Discharge: Family;Available 24 hours/day Type of Home: House Home Layout: Two level;Able to live on main level with bedroom/bathroom Bathroom Shower/Tub: Chiropodist: Standard(toilet risers with handles) Bathroom Accessibility: Yes  Lives With: Spouse Prior Function Level of Independence: Requires assistive device for independence;Independent with basic ADLs;Independent with transfers(Spouse provided supervision assist for ADLs) Vision/Perception  Perception Perception: Impaired Inattention/Neglect: Does not attend to left visual field;Does not attend to left side of body Praxis Praxis: Intact  Cognition Overall Cognitive Status: Impaired/Different from baseline Arousal/Alertness: Awake/alert Attention:  Sustained;Divided Divided Attention: Impaired Memory: Impaired Awareness: Appears intact Problem Solving: Impaired Safety/Judgment: Appears intact Sensation Sensation Light Touch: Appears Intact(She reports numbness/tingling in fingertips at baseline) Stereognosis: Not tested Hot/Cold: Appears Intact Proprioception: Appears Intact Coordination Gross Motor Movements are Fluid and Coordinated: Yes Fine Motor Movements are Fluid and Coordinated: No(Pt dropped multiple items during grooming task completion when standing at sink, required extra time and assist to meet FM demands) Motor  Motor Motor: Other (comment);Hemiplegia Motor -  Skilled Clinical Observations: Mild Lt hemi, generalized weakness   Mobility Transfers Sit to Stand: From toilet;4: Min assist Sit to Stand Details: Verbal cues for precautions/safety;Verbal cues for safe use of DME/AE;Verbal cues for technique Stand to Sit: 4: Min assist;To toilet Stand to Sit Details (indicate cue type and reason): Verbal cues for precautions/safety;Verbal cues for safe use of DME/AE;Verbal cues for technique Locomotion  Ambulation Ambulation: Yes Ambulation/Gait Assistance: 4: Min guard Ambulation Distance (Feet): 150 Feet Assistive device: Rolling walker Gait Gait: Yes Gait Pattern: Impaired Gait Pattern: Poor foot clearance - right;Poor foot clearance - left Gait velocity: decreased for age/gender norms Stairs / Additional Locomotion Stairs: No(time constraints) Wheelchair Mobility Wheelchair Mobility: No  Trunk/Postural Assessment  Cervical Assessment Cervical Assessment: Exceptions to WFL(Forward head) Thoracic Assessment Thoracic Assessment: Exceptions to WFL(Rounded shoulders) Lumbar Assessment Lumbar Assessment: Exceptions to WFL(posterior pelvic tilt) Postural Control Postural Control: Within Functional Limits  Balance Balance Balance Assessed: Yes Dynamic Sitting Balance Dynamic Sitting - Balance Support: No  upper extremity supported;During functional activity Dynamic Sitting - Level of Assistance: 5: Stand by assistance Dynamic Sitting Balance - Compensations: donning pants EOB Dynamic Standing Balance Dynamic Standing - Balance Support: No upper extremity supported;During functional activity Dynamic Standing - Level of Assistance: 4: Min assist Dynamic Standing - Balance Activities: Lateral lean/weight shifting;Forward lean/weight shifting;Reaching for objects Dynamic Standing - Comments: Standing in shower during pericare completion  Extremity Assessment  RUE Assessment RUE Assessment: Within Functional Limits(4+/5 proximal/distal) LUE Assessment LUE Assessment: Exceptions to WFL(3/5 proximally, 4+/5 distally) RLE Assessment RLE Assessment: Within Functional Limits LLE Assessment LLE Assessment: Exceptions to WFL(grossly 4/5 throughout)   See Function Navigator for Current Functional Status.   Refer to Care Plan for Long Term Goals  Recommendations for other services: Therapeutic Recreation  Pet therapy, Kitchen group and Outing/community reintegration  Discharge Criteria: Patient will be discharged from PT if patient refuses treatment 3 consecutive times without medical reason, if treatment goals not met, if there is a change in medical status, if patient makes no progress towards goals or if patient is discharged from hospital.  The above assessment, treatment plan, treatment alternatives and goals were discussed and mutually agreed upon: by patient and by family  Luberta Mutter 03/25/2018, 12:54 PM

## 2018-03-26 ENCOUNTER — Inpatient Hospital Stay (HOSPITAL_COMMUNITY): Payer: Self-pay | Admitting: Occupational Therapy

## 2018-03-26 DIAGNOSIS — E876 Hypokalemia: Secondary | ICD-10-CM

## 2018-03-26 DIAGNOSIS — I69391 Dysphagia following cerebral infarction: Secondary | ICD-10-CM

## 2018-03-26 LAB — BASIC METABOLIC PANEL
Anion gap: 11 (ref 5–15)
BUN: 6 mg/dL (ref 6–20)
CALCIUM: 9 mg/dL (ref 8.9–10.3)
CO2: 25 mmol/L (ref 22–32)
Chloride: 101 mmol/L (ref 101–111)
Creatinine, Ser: 0.53 mg/dL (ref 0.44–1.00)
GFR calc non Af Amer: >60 mL/min (ref >60)
Glucose, Bld: 103 mg/dL — ABNORMAL HIGH (ref 65–99)
Potassium: 3.7 mmol/L (ref 3.5–5.1)
SODIUM: 137 mmol/L (ref 135–145)

## 2018-03-26 MED ORDER — MAGNESIUM SULFATE 2 GM/50ML IV SOLN
2.0000 g | Freq: Once | INTRAVENOUS | Status: AC
  Administered 2018-03-26: 2 g via INTRAVENOUS
  Filled 2018-03-26: qty 50

## 2018-03-26 MED ORDER — DOCUSATE SODIUM 100 MG PO CAPS
100.0000 mg | ORAL_CAPSULE | Freq: Two times a day (BID) | ORAL | Status: DC
  Administered 2018-03-26 – 2018-04-01 (×13): 100 mg via ORAL
  Filled 2018-03-26 (×13): qty 1

## 2018-03-26 MED ORDER — AMLODIPINE BESYLATE 2.5 MG PO TABS
2.5000 mg | ORAL_TABLET | Freq: Every day | ORAL | Status: DC
  Administered 2018-03-26: 2.5 mg via ORAL
  Filled 2018-03-26 (×2): qty 1

## 2018-03-26 MED ORDER — MAGNESIUM OXIDE 400 (241.3 MG) MG PO TABS
400.0000 mg | ORAL_TABLET | Freq: Every day | ORAL | Status: DC
  Administered 2018-03-26 – 2018-04-01 (×7): 400 mg via ORAL
  Filled 2018-03-26 (×7): qty 1

## 2018-03-26 NOTE — IPOC Note (Addendum)
Overall Plan of Care [facility]) Patient Details Name: Pam Johnson MRN: [id number] DOB: [birth year]  Admitting Diagnosis: <principal problem not specified>  Hospital Problems: Active Problems:   Acute ischemic right MCA stroke (HCC)   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[facility])   Essential hypertension   Hypertensive urgency   Acute blood loss anemia   Chronic obstructive pulmonary disease (HCC)   Chronic pain syndrome   Hypokalemia   Hypomagnesemia   Dysphagia, post-stroke     Functional Problem List: Nursing Endurance, Medication Management, Nutrition, Pain, Safety, Skin Integrity  PT Balance, Endurance, Motor, Perception, Safety  OT Balance, Behavior, Perception, Cognition, Safety, Sensory, Endurance, Motor, Vision  SLP Cognition  TR         Basic ADL's: OT Eating, Grooming, Bathing, Dressing, Toileting     Advanced  ADL's: OT Simple Meal Preparation     Transfers: PT Bed Mobility, Bed to Chair, Car, Occupational psychologist, Research scientist (life sciences): PT Ambulation, Stairs     Additional Impairments: OT Fuctional Use of Upper Extremity  SLP Swallowing   Problem Solving, Attention  TR      Anticipated Outcomes Item Anticipated Outcome  Self Feeding Supervision/setup  Swallowing  Min A   Basic self-care  Supervision/cuing   Toileting  Supervision/setup   Bathroom Transfers Supervision/cuing   Bowel/Bladder  Mod I  Transfers  modI  Locomotion  S overall with LRAD  Communication     Cognition  Min A  Pain  4 or less  Safety/Judgment  supervision   Therapy Plan: PT Intensity: Minimum of 1-2 x/day ,45 to 90 minutes PT Frequency: 5 out of 7 days PT Duration Estimated Length of Stay: 7-9 days OT Intensity: Minimum of 1-2 x/day, 45 to 90 minutes OT Frequency: 5 out of 7 days OT Duration/Estimated Length of Stay: 7-10 days SLP Intensity: Minumum of 1-2 x/day, 30 to 90 minutes SLP Frequency: 3 to 5 out of 7 days SLP Duration/Estimated Length of Stay: 8-11  days     Team Interventions: Nursing Interventions Patient/Family Education, Disease Management/Prevention, Skin Care/Wound Management, Pain Management, Cognitive Remediation/Compensation, Medication Management, Discharge Planning  PT interventions Ambulation/gait training, Discharge planning, Functional mobility training, Psychosocial support, Therapeutic Activities, Visual/perceptual remediation/compensation, Therapeutic Exercise, Skin care/wound management, Neuromuscular re-education, Disease management/prevention, Balance/vestibular training, Cognitive remediation/compensation, DME/adaptive equipment instruction, Pain management, Splinting/orthotics, UE/LE Strength taining/ROM, UE/LE Coordination activities, Stair training, Patient/family education, Community reintegration  OT Interventions Warden/ranger, Discharge planning, Functional electrical stimulation, Pain management, Therapeutic Activities, UE/LE Coordination activities, Visual/perceptual remediation/compensation, Therapeutic Exercise, Skin care/wound managment, Patient/family education, Functional mobility training, Disease mangement/prevention, Cognitive remediation/compensation, Firefighter, Fish farm manager, Neuromuscular re-education, Splinting/orthotics, Psychosocial support, UE/LE Strength taining/ROM, Wheelchair propulsion/positioning  SLP Interventions Cognitive remediation/compensation, Cueing hierarchy, Dysphagia/aspiration precaution training, Functional tasks, Medication managment  TR Interventions    SW/CM Interventions  Psychosocial Assessment, Pt & Family Education & Discharge Planning   Barriers to Discharge MD  Medical stability  Nursing      PT Other (comments) visual impairments  OT Medical stability, New oxygen    SLP      SW       Team Discharge Planning: Destination: PT-Home ,OT- Home , SLP-Home Projected Follow-up: PT-Outpatient PT, OT-  Home health OT, 24 hour  supervision/assistance, SLP-24 hour supervision/assistance, Outpatient SLP, Home Health SLP Projected Equipment Needs: PT-None recommended by PT, OT- Tub/shower bench, To be determined, SLP-None recommended by SLP Equipment Details: PT-has RW, rollator, SPC at home, OT-  Patient/family involved in discharge planning: PT- Patient, Family  member/caregiver,  OT-Patient, Family member/caregiver, SLP-Patient, Family member/caregiver  MD ELOS: 7-10d Medical Rehab Prognosis:  Good Assessment:   67 year old female with history of HTN, chronic pain due to spinal stenosis (Dr. Comer LocketJowza) , COPD, recent fall who was admitted on 03/22/18 with left sided weakness, tendency to run into things on the left for few days and facial droop. UDS positive for THC and opiates. 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 She underwent CTA angio with unsuccessful revascularization of acutely occluded prox R-ICA. She had hypotensive episode while in CT scanner and was treated with fluid bolus. Repeat CT head showed mild evolution with right cytotoxic edema and right hemisphere core infarct perhaps doubled in size and interval development of L-MCA thromboembolism --finding not supported by exam per neuro. Dr. Pearlean BrownieSethi recommends ASA/plavix for 3 months followed by ASA alone.    Now requiring 24/7 Rehab RN,MD, as well as CIR level PT, OT and SLP.  Treatment team will focus on ADLs and mobility with goals set at Sup   See Team Conference Notes for weekly updates to the plan of care

## 2018-03-26 NOTE — Progress Notes (Signed)
Occupational Therapy Session Note  Patient Details  Name: Pam Johnson MRN: [id number] Date of Birth: 11/07/1950  Today's Date: 03/26/2018 OT Individual Time: 0905-1002 OT Individual Time Calculation (min): 57 min   Short Term Goals: Week 1:  OT Short Term Goal 1 (Week 1): STGs=LTGs due to ELOS  Skilled Therapeutic Interventions/Progress Updates:    Pt greeted supine in bed, agreeable to tx once she had inhaler from RN. Tx focus on visual scanning, balance, and anxiety mgt during dressing, toileting, and grooming tasks. Pt ambulated with steady assist and RW to gather clothes from drawers and closet. Min vcs for scanning due to vision. When dressing EOB, cues required for locating socks and shoes on Lt side.  She was motivated to remove supplemental 02 during session, as she does not use it at home often. Her 02 sats remained between 92-97% throughout tx, except when she bent forward to don shoes/tie laces. It dropped to 86%, increasing to 94% with calming cues for PLB. Provided pt with lavender essential oil to use via inhalation to increase feelings of calmness as well. We also had on channel 37 (relaxing beach sounds) to remind pt to be mindful of deep breathing. She reported her anxiousness keeps her awake at night. We discussed using aromatherapy techniques and channel 37 for improving sleep hygiene. Also suggested k pad with RN to promote neutral warmth when pt is in bed at night. Pt appreciative of education and recommendations. Toilet transfer/toileting completed at ambulatory level, with cues for locating transfer surfaces and avoiding environmental barriers. Once back in recliner, 02 sats 97% on RA. Per RN ok to keep supplemental 02 removed. Pt was left with all needs within reach at session exit.   Therapy Documentation Precautions:  Precautions Precautions: Fall Precaution Comments: Lt inattention, visual deficits  Restrictions Weight Bearing Restrictions: No Vital Signs: Therapy  Vitals Pulse Rate: 81 BP: (!) 172/73 Patient Position (if appropriate): Sitting Pain: No c/o pain during session    ADL: ADL ADL Comments: Please see functional navigator for ADL status     See Function Navigator for Current Functional Status.   Therapy/Group: Individual Therapy  Durwin Davisson A Braxtyn Bojarski 03/26/2018, 12:42 PM

## 2018-03-26 NOTE — Progress Notes (Signed)
[place] PHYSICAL MEDICINE & REHABILITATION     PROGRESS NOTE  Subjective/Complaints:  Patient seen lying in bed this morning. She states she slept well overnight. She states she uses supplemental oxygen when necessary at home. She notes she had a good first in therapies yesterday. Was called yesterday regarding critical potassium value  ROS: denies CP, SOB, nausea, vomiting, diarrhea.  Objective: Vital Signs: Blood pressure (!) 196/85, pulse 80, temperature 98.3 F (36.8 C), temperature source Oral, resp. rate (!) 21, height 5\' 4"  (1.626 m), weight 51.4 kg (113 lb 5.1 oz), SpO2 98 %. No results found. Recent Labs    03/25/18 0837  WBC 6.3  HGB 13.3  HCT 40.4  PLT 464*   Recent Labs    03/25/18 0837  NA 139  K 2.6*  CL 99*  GLUCOSE 108*  BUN <5*  CREATININE 0.55  CALCIUM 8.9   CBG (last 3)  No results for input(s): GLUCAP in the last 72 hours.  Wt Readings from Last 3 Encounters:  03/24/18 51.4 kg (113 lb 5.1 oz)  03/22/18 51.4 kg (113 lb 5.1 oz)  01/27/16 51.3 kg (113 lb)    Physical Exam:  BP (!) 196/85 (BP Location: Right Arm)   Pulse 80   Temp 98.3 F (36.8 C) (Oral)   Resp (!) 21   Ht 5\' 4"  (1.626 m)   Wt 51.4 kg (113 lb 5.1 oz)   SpO2 98%   BMI 19.45 kg/m  Constitutional: No distress. Nasal cannula in place. Frail appearing  HENT: Ecchymotic areas on left forehead (with induration and scabbing) and bilateral cheeks.  Eyes: EOMI. No discharge. Cardiovascular: RRR. No JVD. Respiratory: unlabored. Barrel chested. Intermittent wheezing.  Neurological: alert and oriented. + Cameron Fair insight and awareness.  Motor: LUE 4+/5 prox to distal.  LLE 4/5 proximally, 4+/5 distally RUE/RLE 5/5 (stable).  Skin: She is not diaphoretic. BUE with multiple healed skin tears and ecchymotic areas. Bilateral shins with multiple petechial areas (due to trauma)  Psychiatric: She has a normal mood and affect. Her behavior is normal.   Assessment/Plan: 1. Functional  deficits secondary to right acute/subacute parietal-occipital lobe infarct which require 3+ hours per day of interdisciplinary therapy in a comprehensive inpatient rehab setting. Physiatrist is providing close team supervision and 24 hour management of active medical problems listed below. Physiatrist and rehab team continue to assess barriers to discharge/monitor patient progress toward functional and medical goals.  Function:  Bathing Bathing position   Position: Shower  Bathing parts Body parts bathed by patient: Right arm, Left arm, Chest, Abdomen, Front perineal area, Buttocks, Right upper leg, Left upper leg, Right lower leg, Left lower leg Body parts bathed by helper: Back  Bathing assist Assist Level: Touching or steadying assistance(Pt > 75%)      Upper Body Dressing/Undressing Upper body dressing   What is the patient wearing?: Pull over shirt/dress     Pull over shirt/dress - Perfomed by patient: Thread/unthread right sleeve, Thread/unthread left sleeve, Put head through opening, Pull shirt over trunk          Upper body assist Assist Level: Supervision or verbal cues      Lower Body Dressing/Undressing Lower body dressing   What is the patient wearing?: Underwear, Pants, Non-skid slipper socks Underwear - Performed by patient: Thread/unthread right underwear leg, Thread/unthread left underwear leg, Pull underwear up/down   Pants- Performed by patient: Thread/unthread right pants leg, Thread/unthread left pants leg, Pull pants up/down   Non-skid slipper socks- Performed  by patient: Don/doff right sock, Don/doff left sock                    Lower body assist Assist for lower body dressing: Touching or steadying assistance (Pt > 75%)      Toileting Toileting Toileting activity did not occur: No continent bowel/bladder event Toileting steps completed by patient: Adjust clothing after toileting   Toileting Assistive Devices: Grab bar or rail  Toileting  assist Assist level: Supervision or verbal cues   Transfers Chair/bed transfer   Chair/bed transfer method: Stand pivot Chair/bed transfer assist level: Touching or steadying assistance (Pt > 75%) Chair/bed transfer assistive device: Armrests     Locomotion Ambulation     Max distance: 150 Assist level: Touching or steadying assistance (Pt > 75%)   Wheelchair Wheelchair activity did not occur: N/A        Cognition Comprehension Comprehension assist level: Follows basic conversation/direction with no assist  Expression Expression assist level: Expresses basic needs/ideas: With extra time/assistive device  Social Interaction Social Interaction assist level: Interacts appropriately with others with medication or extra time (anti-anxiety, antidepressant).  Problem Solving Problem solving assist level: Solves basic problems with no assist  Memory Memory assist level: Recognizes or recalls 90% of the time/requires cueing < 10% of the time    Medical Problem List and Plan:  1. Functional and mobility deficits secondary to right acute/subacute parietal-occipital lobe infarct on 5/29  Continue CIR 2. DVT Prophylaxis/Anticoagulation: Pharmaceutical: Lovenox  3. Chronic LBP pain/Pain Management: Was on Oxycontin 20 mg tid/ Lyrica tid with hydrocodone qid prn.   Resumed OxyContin at 10 mg bid and monitor for tolerance.  Continue hydrocodone prn  4. Mood: LCSW to follow for evaluation and support.  5. Neuropsych: This patient is capable of making decisions on her own behalf.  6. Skin/Wound Care: routine pressure relief measures.  7. Fluids/Electrolytes/Nutrition: Monitor I/O.    D3 thins, advance diet as tolerated 8. COPD: Last exacerbation 12/2017. Continue Dulera bid, Singulair and spiriva  9. HTN: Monitor BP bid. 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   On Norvasc 2.5 PTA, restarted on 6/2   Hydralazine when necessary  Hypertensive urgency overnight 10. HH with GERD: On Reglan  qid  11. Tobacco abuse: Continue to encourage cessation. Added nicotine patch.  12. H/o anxiety disorder: On Remeron and Zoloft daily.  13. Advanced OA right wrist: Continue splint with local measures. 14. Acute blood loss anemia: Resolved   Hemoglobin 13.3 on 6/1 15. Hypokalemia  Potassium 2.6 on 6/1, supplemented with IV therapy on 6/1 and daily supplementation started on 6/1  Labs pending 16. Hypomagnesemia  Magnesium 1.6 and 6/1  Supplement initiated on 6/2  LOS (Days) 2 A FACE TO FACE EVALUATION WAS PERFORMED  Johna Kearl Karis Juba 03/26/2018 7:24 AM

## 2018-03-27 ENCOUNTER — Inpatient Hospital Stay (HOSPITAL_COMMUNITY): Payer: Self-pay | Admitting: Occupational Therapy

## 2018-03-27 ENCOUNTER — Inpatient Hospital Stay (HOSPITAL_COMMUNITY): Payer: Medicare Other | Admitting: Physical Therapy

## 2018-03-27 ENCOUNTER — Encounter (HOSPITAL_COMMUNITY): Payer: Self-pay

## 2018-03-27 ENCOUNTER — Inpatient Hospital Stay (HOSPITAL_COMMUNITY): Payer: Self-pay

## 2018-03-27 DIAGNOSIS — J410 Simple chronic bronchitis: Secondary | ICD-10-CM

## 2018-03-27 DIAGNOSIS — R414 Neurologic neglect syndrome: Secondary | ICD-10-CM

## 2018-03-27 MED ORDER — AMLODIPINE BESYLATE 5 MG PO TABS
5.0000 mg | ORAL_TABLET | Freq: Every day | ORAL | Status: DC
  Administered 2018-03-27 – 2018-03-29 (×3): 5 mg via ORAL
  Filled 2018-03-27 (×2): qty 1

## 2018-03-27 MED ORDER — MIRTAZAPINE 15 MG PO TABS
7.5000 mg | ORAL_TABLET | Freq: Every day | ORAL | Status: DC
  Administered 2018-03-27 – 2018-03-31 (×5): 7.5 mg via ORAL
  Filled 2018-03-27 (×5): qty 1

## 2018-03-27 MED ORDER — ZOLPIDEM TARTRATE 5 MG PO TABS
5.0000 mg | ORAL_TABLET | Freq: Every evening | ORAL | Status: DC | PRN
  Administered 2018-03-27 – 2018-03-31 (×5): 5 mg via ORAL
  Filled 2018-03-27 (×5): qty 1

## 2018-03-27 NOTE — Evaluation (Signed)
Recreational Therapy Assessment and Plan  Patient Details  Name: HALEN MOSSBARGER MRN: [id number] Date of Birth: 1951-10-24 Today's Date: 03/27/2018  Rehab Potential: Good  ELOS: 7 days  Assessment Problem List:      Patient Active Problem List   Diagnosis Date Noted  . Hypertensive urgency   . Acute blood loss anemia   . Chronic obstructive pulmonary disease ([place])   . Chronic pain syndrome   . Acute ischemic right MCA stroke ([place]) 03/24/2018  . 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([place])   . Essential hypertension   . Stroke (cerebrum) ([place]) 03/22/2018  . 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 Pneumonia 10/18/2015    Past Medical History:      Past Medical History:  Diagnosis Date  . Acid reflux   . COPD (chronic obstructive pulmonary disease) ([place])   . Hypertension   . Sacral fracture ([place])   . Spinal stenosis    Past Surgical History:       Past Surgical History:  Procedure Laterality Date  . ABDOMINAL HYSTERECTOMY    . CHOLECYSTECTOMY    . HERNIA REPAIR    . IR ANGIO INTRA EXTRACRAN SEL COM CAROTID INNOMINATE BILAT MOD SED  03/22/2018  . IR ANGIO VERTEBRAL SEL VERTEBRAL UNI R MOD SED  03/22/2018  . IR CT HEAD LTD  03/22/2018  . IR PERCUTANEOUS ART THROMBECTOMY/INFUSION INTRACRANIAL INC DIAG ANGIO  03/22/2018  . NISSEN FUNDOPLICATION    . RADIOLOGY WITH ANESTHESIA N/A 03/22/2018   Procedure: IR WITH ANESTHESIA;  Surgeon: Luanne Bras, MD;  Location: [facility];  Service: Radiology;  Laterality: N/A;  . TOTAL HIP ARTHROPLASTY Right 11/2017    Assessment & Plan Clinical Impression: SHAKITA KEIR is a 67 year old female with history of HTN, chronic pain due to spinal stenosis (Dr. Michail Sermon) , COPD, recent fall who was admitted on 03/22/18 with left sided weakness, tendency to run into things on the left for few days and facial droop. UDS positive for THC and opiates. CT head done showing moderate acute infarct in right occipital  lobe. 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 She underwent CTA angio with unsuccessful revascularization of acutely occluded prox R-ICA. She had hypotensive episode while in CT scanner and was treated with fluid bolus. Repeat CT head showed mild evolution with right cytotoxic edema and right hemisphere core infarct perhaps doubled in size and interval development of L-MCA thromboembolism --finding not supported by exam per neuro. Dr. Leonie Man recommends ASA/plavix for 3 months followed by ASA alone.  MRI brain not done due to claustrophobia. 2D echo done today. Patient with resultant left sided weakness, left inattention and difficulty performing tasks. CIR recommended due to functional deficits. Patient transferred to CIR on 03/24/2018 .  Met with pt to discuss TR services during co-treat with PT.  Much of conversation focused on pt sharing her history of anxiety and how it is exacerbated with recent health issues and now this admission.  Discussed neuro-psychological services offered on CIR and pt agreeable to participate.  SW made aware.    Plan Min 1 TR session >20 minutes during LOS   Recommendations for other services: Neuropsych  Discharge Criteria: Patient will be discharged from TR if patient refuses treatment 3 consecutive times without medical reason.  If treatment goals not met, if there  is a change in medical status, if patient makes no progress towards goals or if patient is discharged from hospital.  The above assessment, treatment plan, treatment alternatives and goals were discussed and mutually agreed upon: by patient  [place] 03/27/2018, 3:31 PM

## 2018-03-27 NOTE — Progress Notes (Signed)
Social Work  Social Work Assessment and Plan  Patient Details  Name: Pam Johnson MRN: [id number] Date of Birth: [birth year]  Today's Date: 03/27/2018  Problem List:  Patient Active Problem List   Diagnosis Date Noted  . Hypokalemia   . Hypomagnesemia   . Dysphagia, post-stroke   . Hypertensive urgency   . Acute blood loss anemia   . Chronic obstructive pulmonary disease (HCC)   . Chronic pain syndrome   . Acute ischemic right MCA stroke (HCC) 03/24/2018  . 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(HCC)   . Essential hypertension   . 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 Pneumonia 10/18/2015   Past Medical History:  Past Medical History:  Diagnosis Date  . Acid reflux   . COPD (chronic obstructive pulmonary disease) (HCC)   . Hypertension   . Sacral fracture (HCC)   . Spinal stenosis    Past Surgical History:  Past Surgical History:  Procedure Laterality Date  . ABDOMINAL HYSTERECTOMY    . CHOLECYSTECTOMY    . HERNIA REPAIR    . IR ANGIO INTRA EXTRACRAN SEL COM CAROTID INNOMINATE UNI L MOD SED  03/22/2018  . IR ANGIO VERTEBRAL SEL SUBCLAVIAN INNOMINATE UNI R MOD SED  03/22/2018  . IR CT HEAD LTD  03/22/2018  . IR PERCUTANEOUS ART THROMBECTOMY/INFUSION INTRACRANIAL INC DIAG ANGIO  03/22/2018  . NISSEN FUNDOPLICATION    . RADIOLOGY WITH ANESTHESIA N/A 03/22/2018   Procedure: IR WITH ANESTHESIA;  Surgeon: Julieanne Cotton, MD;  Location: [facility] OR;  Service: Radiology;  Laterality: N/A;  . TOTAL HIP ARTHROPLASTY Right 11/2017   Social History:  reports that she has been smoking cigarettes.  She has been smoking about 1.00 pack per day. She has never used smokeless tobacco. She reports that she drinks alcohol. She reports that she does not use drugs.  Family / Support Systems Marital Status: Married Patient Roles: Spouse, Parent Spouse/Significant Other: Huel [phone] Children: Crystal Cole-daughter [phone]  Josh-son  [phone] Other Supports: Friends Anticipated Caregiver: Husband Ability/Limitations of Caregiver: Husband has health issues and can only propvide supervision due to bad knees and a bad back Caregiver Availability: 24/7 Family Dynamics: Close with husband and children they all are there for one another. Pt had recently recovered from a THR and now this ha happened. She feels between her family and friends she has good supports.  Social History Preferred language: English Religion: None Cultural Background: No issues Education: High School Read: Yes Write: Yes Employment Status: Retired Fish farm manager Issues: No issues Guardian/Conservator: None-according to MD pt is capable of making her own decisions while here   Abuse/Neglect Abuse/Neglect Assessment Can Be Completed: Yes Physical Abuse: Denies Verbal Abuse: Denies Sexual Abuse: Denies Exploitation of patient/patient's resources: Denies Self-Neglect: Denies  Emotional Status Pt's affect, behavior adn adjustment status: Pt is able to explain her stroke and deficits. She has always been one who has taken care of herself and takes pride in this. She was doing well with her recovery from her hip surgery until this. She is glad she did not damage her hip with falling. Recent Psychosocial Issues: other health issues-recent hip surgery 11/2017 Pyschiatric History: No issues Substance Abuse History: Tobacco pt aware she should quit, but probably will not. She doesn't feel there are any other issues.  Patient / Family Perceptions, Expectations & Goals Pt/Family understanding of illness & functional limitations: Pt and husband can explain her stroke and deficits. She has made progress and is  enouraged by this and hopeful. She does talk with the MD and feels her questions and concerns are being addressed. Premorbid pt/family roles/activities: Wife, Mother, grandmother, church member, friend, etc Anticipated changes in  roles/activities/participation: resume Pt/family expectations/goals: Pt states: " I want to be able to take care of myself before I go home."  Husband states: " I will do whatever she needs, she would for me.""  [employer] Community Agencies: None Premorbid Home Care/DME Agencies: Other (Comment)([facility] had before-11/2017) Transportation available at discharge: Husband  Discharge Planning Living Arrangements: Spouse/significant other Support Systems: Spouse/significant other, Children, Friends/neighbors, ArchitectChurch/faith community, Other relatives Type of Residence: Private residence Insurance Resources: [employer] Financial Resources: Social Security Financial Screen Referred: No Living Expenses: Own Money Management: Spouse, Patient Does the patient have any problems obtaining your medications?: No Home Management: Both of them husband has been doing since her hip surgery Patient/Family Preliminary Plans: Return home with husband who can provide assist if needed. He plans to be here daily and observe pt in therapies to see her progress here. Discussed team conference on Wed will disucss goals and target discharge date. Work on discharge needs. Social Work Anticipated Follow Up Needs: HH/OP  Clinical Impression Pleasant female who is motivated to do well and recover from this stroke. She has progressed since it has happened which has been encouraging to her. Her husband is involved and willing to assist her at discharge, will await team conference Wed.  Lucy Chrisupree, Manvir Thorson G 03/27/2018, 12:38 PM

## 2018-03-27 NOTE — Progress Notes (Addendum)
Physical Therapy Session Note  Patient Details  Name: Pam Johnson Masley MRN: [id number] Date of Birth: 08/20/1951  Today's Date: 03/27/2018 PT Individual Time: 1335-1446 PT Individual Time Calculation (min): 71 min   Short Term Goals: Week 1:  PT Short Term Goal 1 (Week 1): =LTG due to estimated LOS  Skilled Therapeutic Interventions/Progress Updates:  Pt received in bed & agreeable to tx. Pt reporting R wrist pain but applied muscle rub. Recreational therapist arrived and present for session. Pt's husband present as well. Discussed PLOF with pt reporting she no longer cooks at home and has been using a RW since her hip replacement surgery in February, but she also has a SPC at home. Pt able to report her vision has been different since stroke and therapist educated pt & husband on this being a barrier and recommends supervision upon d/c. Pt transferred OOB and ambulates within room & bathroom with RW & steady assist with max cuing to attend to L as pt bumping RW on doorframe. Pt completes toilet transfer with close supervision<>steady assist; pt with continent void on toilet and performed peri hygiene without assistance. Pt completes hand hygiene and applies dentures standing at sink, with assistance applying fixodent. Pt ambulates room>elevators with RW and min assist before requiring seated rest break 2/2 SOB but SpO2 = 99%. Educated pt on pursed lip breathing during activities. Pt ambulates 78 ft without AD and min assist with task focusing on dynamic balance and NMR with improving weight shifting L<>R as task progressed. Pt negotiated uneven surface without AD & min assist as well. Pt engaged in standing ball toss with steady assist with task focusing on standing balance & endurance before pt requiring seated rest break 2/2 SOB. Pt requesting to return to room 2/2 anxiety & fatigue but engaged in conversation with recreational therapist & therapist providing encouragement. Pt returned to room 2/2 need  to void & ambulated within room & bathroom with continued cuing to attend to L side of RW. Pt with continent void on toilet and requires cuing to square RW up to sink when performing hand hygiene. Pt returned to bed & left with alarm set, call bell within reach, husband in room.    Addendum: pt only able to stand for 1-2 minutes when engaging in ball toss before requiring rest break.  Therapy Documentation Precautions:  Precautions Precautions: Fall Precaution Comments: Lt inattention, visual deficits  Restrictions Weight Bearing Restrictions: No    See Function Navigator for Current Functional Status.   Therapy/Group: Individual Therapy  Sandi MariscalVictoria Johnson Azula Zappia 03/27/2018, 4:30 PM

## 2018-03-27 NOTE — Progress Notes (Signed)
Occupational Therapy Session Note  Patient Details  Name: Pam Johnson Deeney MRN: [id number] Date of Birth: 05/30/1951  Today's Date: 03/27/2018 OT Individual Time: 0930-1000 and 1100-1200 OT Individual Time Calculation (min): 30 min and 60 min   Short Term Goals: Week 1:  OT Short Term Goal 1 (Week 1): STGs=LTGs due to ELOS  Skilled Therapeutic Interventions/Progress Updates:    Visit 1:  Pt seen this session to address L inattention and L shoulder strength. Pt has kyphotic posture which limits her shoulder flexion ROM.  Worked on active trunk extension and scapular retraction. Pt also demonstrated with impaired L proprioception. Worked on awareness of limb position. Pt requested to lay back down before next session. Pt resting in bed with bed alarm on.  Visit 2: no c/o pain  Pt seen this session for ADL training of shower and dressing. Pt used RW with steadying A to ambulate to and from bathroom. Cues to not try to pick up walker and for negotiation through tight spaces.  Pt was able to bathe self with S and then ambulated to bed to dress with S and cues for donning pants. Pt had difficulty with underwear and pants as she was confusing L/R sides. With cues to place L leg first she was able to don.  Pt's husband present and discussed safety at home, slowing down her pace, awareness of L side. Pt's lunch arrived. Pt sat in w/c with QRB on and lunch meal in place.  Therapy Documentation Precautions:  Precautions Precautions: Fall Precaution Comments: Lt inattention, visual deficits  Restrictions Weight Bearing Restrictions: No    Vital Signs: Therapy Vitals Temp: 98.4 F (36.9 C) Pulse Rate: 92 Resp: 18 BP: (!) 151/78 Patient Position (if appropriate): Lying Oxygen Therapy SpO2: 95 % O2 Device: Room Air O2 Flow Rate (L/min): 2 L/min Pain:   ADL: ADL ADL Comments: Please see functional navigator for ADL status  See Function Navigator for Current Functional  Status.   Therapy/Group: Individual Therapy  Elanore Talcott 03/27/2018, 9:26 AM

## 2018-03-27 NOTE — Care Management Note (Signed)
[facility] Individual Statement of Services  Patient Name:  Pam Johnson  Date:  03/27/2018  Welcome to the [facility].  Our goal is to provide you with an individualized program based on your diagnosis and situation, designed to meet your specific needs.  With this comprehensive rehabilitation program, you will be expected to participate in at least 3 hours of rehabilitation therapies Monday-Friday, with modified therapy programming on the weekends.  Your rehabilitation program will include the following services:  Physical Therapy (PT), Occupational Therapy (OT), Speech Therapy (ST), 24 hour per day rehabilitation nursing, Case Management (Social Worker), Rehabilitation Medicine, Nutrition Services and Pharmacy Services  Weekly team conferences will be held on Wednesday to discuss your progress.  Your Social Worker will talk with you frequently to get your input and to update you on team discussions.  Team conferences with you and your family in attendance may also be held.  Expected length of stay: 7-10 days Overall anticipated outcome: supervision with cueing  Depending on your progress and recovery, your program may change. Your Social Worker will coordinate services and will keep you informed of any changes. Your Social Worker's name and contact numbers are listed  below.  The following services may also be recommended but are not provided by the [facility]:   Driving Evaluations  Home Health Rehabiltiation Services  Outpatient Rehabilitation Services    Arrangements will be made to provide these services after discharge if needed.  Arrangements include referral to agencies that provide these services.  Your insurance has been verified to be:  Medicare Part A & B Your primary doctor is:  None  Pertinent information will be shared with your doctor and your insurance company.  Social Worker:  Dossie DerBecky Kayde Warehime, SW  [phone] or [phone]) [phone]  Information discussed with and copy given to patient by: Lucy Chrisupree, Samiel Peel G, 03/27/2018, 12:40 PM

## 2018-03-27 NOTE — Progress Notes (Signed)
Speech Language Pathology Daily Session Note  Patient Details  Name: [name] MRN: [id number] Date of Birth: 12/09/1950  Today's Date: 03/27/2018 SLP Individual Time: [id number] SLP Individual Time Calculation (min): 45 min  Short Term Goals: Week 1: SLP Short Term Goal 1 (Week 1): Pt will consume dys 3 trials with efficient mastication and no overt s/s aspiration to demonstrate readiness of diet advancement, SLP Short Term Goal 2 (Week 1): Pt will demonstrate selective attention in moderately distracting environment for 45 minutes with min A verbal cues. SLP Short Term Goal 3 (Week 1): Pt will demonstrate functional problem solving for basic and familar (mildly complex) tasks with min A verbal cues. SLP Short Term Goal 4 (Week 1): Pt will demonstarte compensatory strategies for visual deficits during functional problem solving tasks with min A verbal cues., SLP Short Term Goal 5 (Week 1): Pt will self-monitor and correct functional errors with min A verbal cues.   Skilled Therapeutic Interventions:Skilled ST services focused on cognitive skills. SLP facilitated semi-complex problem solving utilizing ALFA daily math problems, pt required min A verbal cues for working memory and complex problems solving with mod A verbal cues for error awareness/correction. Pt demonstrated anxious behaviors and required mod-min A verbal cues for selective attention. Pt was left in room with call bell within reach. Recommend to continue skilled ST services.     Function:  Eating Eating                 Cognition Comprehension Comprehension assist level: Follows basic conversation/direction with no assist  Expression   Expression assist level: Expresses basic needs/ideas: With extra time/assistive device  Social Interaction Social Interaction assist level: Interacts appropriately with others with medication or extra time (anti-anxiety, antidepressant).  Problem Solving Problem solving assist  level: Solves basic problems with no assist  Memory Memory assist level: Recognizes or recalls 90% of the time/requires cueing < 10% of the time    Pain Pain Assessment Pain Score: 0-No pain  Therapy/Group: Individual Therapy  Maurya Nethery  [facility] 03/27/2018, 2:46 PM

## 2018-03-27 NOTE — Progress Notes (Signed)
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  Objective: Vital Signs: Blood pressure (!) 200/96, pulse 92, temperature 98.4 F (36.9 C), resp. rate 18, height 5\' 4"  (1.626 m), weight 51.4 kg (113 lb 5.1 oz), SpO2 100 %. No results found. Recent Labs    03/25/18 0837  WBC 6.3  HGB 13.3  HCT 40.4  PLT 464*   Recent Labs    03/25/18 0837 03/26/18 1101  NA 139 137  K 2.6* 3.7  CL 99* 101  GLUCOSE 108* 103*  BUN <5* 6  CREATININE 0.55 0.53  CALCIUM 8.9 9.0   CBG (last 3)  No results for input(s): GLUCAP in the last 72 hours.  Wt Readings from Last 3 Encounters:  03/24/18 51.4 kg (113 lb 5.1 oz)  03/22/18 51.4 kg (113 lb 5.1 oz)  01/27/16 51.3 kg (113 lb)    Physical Exam:  BP (!) 200/96 (BP Location: Right Arm)   Pulse 92   Temp 98.4 F (36.9 C)   Resp 18   Ht 5\' 4"  (1.626 m)   Wt 51.4 kg (113 lb 5.1 oz)   SpO2 100%   BMI 19.45 kg/m  Constitutional: No distress. Nasal cannula in place. Frail appearing  HENT: Ecchymotic areas on left forehead (with induration and scabbing) and bilateral cheeks.  Eyes: EOMI. No discharge. Cardiovascular: RRR. No JVD. Respiratory: unlabored. Barrel chested. Intermittent wheezing.  Neurological: alert and oriented. +  Fair insight and awareness.  Motor: LUE 4+/5 prox to distal.  LLE 4/5 proximally, 4+/5 distally RUE/RLE 5/5 (stable).  Skin: She is not diaphoretic. BUE with multiple healed skin tears and ecchymotic areas. Bilateral shins with multiple petechial areas (due to trauma)  Psychiatric: She has a normal mood and affect. Her behavior is normal.    Assessment/Plan: 1. Functional deficits secondary to right acute/subacute parietal-occipital lobe infarct which require 3+ hours per day of interdisciplinary therapy in a comprehensive inpatient rehab setting. Physiatrist is providing close team supervision and 24 hour management of active medical problems listed below. Physiatrist and rehab team continue to assess barriers to discharge/monitor patient progress toward functional and medical goals.  Function:  Bathing Bathing position Bathing activity did not occur: Refused Position: Systems developer parts bathed by patient: Right arm, Left arm, Chest, Abdomen, Front perineal area, Buttocks, Right upper leg, Left upper leg, Right lower leg, Left lower leg Body parts bathed by helper: Back  Bathing assist Assist Level: Touching or steadying assistance(Pt > 75%)      Upper Body Dressing/Undressing Upper body dressing   What is the patient wearing?: Pull over shirt/dress     Pull over shirt/dress - Perfomed by patient: Thread/unthread right sleeve, Thread/unthread left sleeve, Put head through opening, Pull shirt over trunk          Upper body assist Assist Level: Supervision or verbal cues      Lower Body Dressing/Undressing Lower body dressing   What is the patient wearing?: Pants, Socks, Shoes Underwear - Performed by patient: Thread/unthread right underwear leg, Thread/unthread left underwear leg, Pull underwear up/down   Pants- Performed by patient: Thread/unthread right pants leg,  Thread/unthread left pants leg, Pull pants up/down   Non-skid slipper socks- Performed by patient: Don/doff right sock, Don/doff left sock   Socks - Performed by patient: Don/doff right sock, Don/doff left sock   Shoes - Performed by patient: Don/doff left shoe, Fasten left Shoes - Performed by helper: Don/doff right shoe, Fasten right          Lower body assist Assist for lower body dressing: Touching or steadying assistance  (Pt > 75%)      Toileting Toileting Toileting activity did not occur: No continent bowel/bladder event Toileting steps completed by patient: Adjust clothing after toileting   Toileting Assistive Devices: Grab bar or rail  Toileting assist Assist level: Touching or steadying assistance (Pt.75%)   Transfers Chair/bed transfer   Chair/bed transfer method: Stand pivot Chair/bed transfer assist level: Touching or steadying assistance (Pt > 75%) Chair/bed transfer assistive device: Armrests     Locomotion Ambulation     Max distance: 150 Assist level: Touching or steadying assistance (Pt > 75%)   Wheelchair Wheelchair activity did not occur: N/A        Cognition Comprehension Comprehension assist level: Follows basic conversation/direction with no assist  Expression Expression assist level: Expresses basic needs/ideas: With extra time/assistive device  Social Interaction Social Interaction assist level: Interacts appropriately with others with medication or extra time (anti-anxiety, antidepressant).  Problem Solving Problem solving assist level: Solves basic problems with no assist  Memory Memory assist level: Recognizes or recalls 90% of the time/requires cueing < 10% of the time    Medical Problem List and Plan:  1. Functional and mobility deficits secondary to right acute/subacute parietal-occipital lobe infarct on 5/29- ASA and Clopidigrel  Continue CIR PT, OT, SLP 2. DVT Prophylaxis/Anticoagulation: Pharmaceutical: Lovenox  3. Chronic LBP pain/Pain Management: Was on Oxycontin 20 mg tid/ Lyrica tid with hydrocodone qid prn.   Resumed OxyContin at 10 mg bid and monitor for tolerance.  Continue hydrocodone prn Took 3-4 tabs per day at Home 4. Mood: LCSW to follow for evaluation and support.  5. Neuropsych: This patient is capable of making decisions on her own behalf.  6. Skin/Wound Care: routine pressure relief measures.  7. Fluids/Electrolytes/Nutrition: Monitor I/O.    D3  thins, advance diet as tolerated 8. COPD: Last exacerbation 12/2017. Continue Dulera bid, Singulair and spiriva  9. HTN: Monitor BP bid. 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   On Norvasc 2.5 PTA, restarted on 6/2- increase to 5mg    Hydralazine when necessary   10. 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 Tobacco abuse: Continue to encourage cessation. Added nicotine patch.  12. H/o anxiety disorder: On Remeron and Zoloft daily.  13. Advanced OA right wrist: Continue splint with local measures. 14. Acute blood loss anemia: Resolved   Hemoglobin 13.3 on 6/1 15. Hypokalemia- improved post supplementation- recheck 6/5  Potassium 2.6 on 6/1, supplemented with IV therapy on 6/1 and daily supplementation started on 6/1   16. Hypomagnesemia  Magnesium 1.6 and 6/1  Supplement initiated on 6/2  LOS (Days) 3 A FACE TO FACE EVALUATION WAS PERFORMED  Erick Colacendrew E Syrina Wake 03/27/2018 7:51 AM

## 2018-03-28 ENCOUNTER — Inpatient Hospital Stay (HOSPITAL_COMMUNITY): Payer: Self-pay | Admitting: Occupational Therapy

## 2018-03-28 ENCOUNTER — Inpatient Hospital Stay (HOSPITAL_COMMUNITY): Payer: Medicare Other | Admitting: Physical Therapy

## 2018-03-28 ENCOUNTER — Inpatient Hospital Stay (HOSPITAL_COMMUNITY): Payer: Self-pay

## 2018-03-28 MED ORDER — BOOST / RESOURCE BREEZE PO LIQD CUSTOM
1.0000 | Freq: Two times a day (BID) | ORAL | Status: DC
  Administered 2018-03-29 – 2018-04-01 (×7): 1 via ORAL

## 2018-03-28 NOTE — Progress Notes (Signed)
[facility] PHYSICAL MEDICINE & REHABILITATION     PROGRESS NOTE  Subjective/Complaints:   [place] from fixodent Slept better with Ambien  ROS: denies CP, SOB, nausea, vomiting, diarrhea.  Objective: Vital Signs: Blood pressure (!) 164/84, pulse 91, temperature 99.3 F (37.4 C), temperature source Oral, resp. rate 19, height 5\' 4"  (1.626 m), weight 51.4 kg (113 lb 5.1 oz), SpO2 93 %. No results found. Recent Labs    03/25/18 0837  WBC 6.3  HGB 13.3  HCT 40.4  PLT 464*   Recent Labs    03/25/18 0837 03/26/18 1101  NA 139 137  K 2.6* 3.7  CL 99* 101  GLUCOSE 108* 103*  BUN <5* 6  CREATININE 0.55 0.53  CALCIUM 8.9 9.0   CBG (last 3)  No results for input(s): GLUCAP in the last 72 hours.  Wt Readings from Last 3 Encounters:  03/24/18 51.4 kg (113 lb 5.1 oz)  03/22/18 51.4 kg (113 lb 5.1 oz)  01/27/16 51.3 kg (113 lb)    Physical Exam:  BP (!) 164/84 (BP Location: Right Arm)   Pulse 91   Temp 99.3 F (37.4 C) (Oral)   Resp 19   Ht 5\' 4"  (1.626 m)   Wt 51.4 kg (113 lb 5.1 oz)   SpO2 93%   BMI 19.45 kg/m  Constitutional: No distress. Nasal cannula in place. Frail appearing  HENT: Ecchymotic areas on left forehead (with induration and scabbing) and bilateral cheeks.  Eyes: EOMI. No discharge. Cardiovascular: RRR. No JVD. Respiratory: unlabored. Barrel chested. Intermittent wheezing.  Neurological: alert and oriented. + [place] Fair insight and awareness.  Motor: LUE 4+/5 prox to distal.  LLE 4/5 proximally, 4+/5 distally RUE/RLE 5/5 (stable).  Skin: She is not diaphoretic. BUE with multiple healed skin tears and ecchymotic areas. Bilateral shins with multiple petechial areas (due to trauma)  Psychiatric: She has a normal mood and affect. Her behavior is normal.   Assessment/Plan: 1. Functional deficits secondary to right acute/subacute parietal-occipital lobe infarct which require 3+ hours per day of interdisciplinary therapy in a comprehensive inpatient rehab  setting. Physiatrist is providing close team supervision and 24 hour management of active medical problems listed below. Physiatrist and rehab team continue to assess barriers to discharge/monitor patient progress toward functional and medical goals.  Function:  Bathing Bathing position Bathing activity did not occur: Refused Position: Systems developerhower  Bathing parts Body parts bathed by patient: Right arm, Left arm, Chest, Abdomen, Front perineal area, Buttocks, Right upper leg, Left upper leg, Right lower leg, Left lower leg Body parts bathed by helper: Back  Bathing assist Assist Level: Supervision or verbal cues      Upper Body Dressing/Undressing Upper body dressing   What is the patient wearing?: Pull over shirt/dress     Pull over shirt/dress - Perfomed by patient: Thread/unthread right sleeve, Thread/unthread left sleeve, Put head through opening, Pull shirt over trunk          Upper body assist Assist Level: Supervision or verbal cues      Lower Body Dressing/Undressing Lower body dressing   What is the patient wearing?: Underwear, Pants, Non-skid slipper socks Underwear - Performed by patient: Thread/unthread right underwear leg, Thread/unthread left underwear leg, Pull underwear up/down   Pants- Performed by patient: Thread/unthread right pants leg, Thread/unthread left pants leg, Pull pants up/down   Non-skid slipper socks- Performed by patient: Don/doff right sock, Don/doff left sock   Socks - Performed by patient: Don/doff right sock, Don/doff left sock  Shoes - Performed by patient: Don/doff left shoe, Fasten left Shoes - Performed by helper: Don/doff right shoe, Fasten right          Lower body assist Assist for lower body dressing: Supervision or verbal cues      Toileting Toileting Toileting activity did not occur: No continent bowel/bladder event Toileting steps completed by patient: Adjust clothing after toileting Toileting steps completed by helper:  Adjust clothing prior to toileting, Performs perineal hygiene, Adjust clothing after toileting(per Candice, NT report) Toileting Assistive Devices: Grab bar or rail  Toileting assist Assist level: Touching or steadying assistance (Pt.75%)   Transfers Chair/bed transfer   Chair/bed transfer method: Ambulatory Chair/bed transfer assist level: Touching or steadying assistance (Pt > 75%) Chair/bed transfer assistive device: Patent attorney     Max distance: 100 ft  Assist level: Touching or steadying assistance (Pt > 75%)   Wheelchair Wheelchair activity did not occur: N/A        Cognition Comprehension Comprehension assist level: Follows basic conversation/direction with no assist  Expression Expression assist level: Expresses basic needs/ideas: With extra time/assistive device  Social Interaction Social Interaction assist level: Interacts appropriately with others with medication or extra time (anti-anxiety, antidepressant).  Problem Solving Problem solving assist level: Solves basic problems with no assist  Memory Memory assist level: Recognizes or recalls 90% of the time/requires cueing < 10% of the time    Medical Problem List and Plan:  1. Functional and mobility deficits secondary to right acute/subacute parietal-occipital lobe infarct on 5/29- ASA and Clopidigrel  Continue CIR PT, OT, SLP 2. DVT Prophylaxis/Anticoagulation: Pharmaceutical: Lovenox  3. 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   Resumed OxyContin at 10 mg bid and monitor for tolerance.  Continue hydrocodone prn Took 3-4 tabs per day at Home- currently on 7.5mg  q6h prn 4. Mood: LCSW to follow for evaluation and support.  5. Neuropsych: This patient is capable of making decisions on her own behalf.  6. Skin/Wound Care: routine pressure relief measures.  7. Fluids/Electrolytes/Nutrition: Monitor I/O.  recorded yest, BUN 5, may d/c IV   D3 thins, advance diet as tolerated 8. COPD: Last exacerbation 12/2017. Continue Dulera bid, Singulair and spiriva  9. HTN: Monitor BP bid. 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   On Norvasc 2.5 PTA, restarted on 6/2- increase to 5mg    Hydralazine when necessary  Am value improving Vitals:   03/27/18 2013 03/28/18 0501  BP:  (!) 164/84  Pulse: 79 91  Resp: 19 19  Temp:  99.3 F (37.4 C)  SpO2: 96% 93%   10. 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 Tobacco abuse: Continue to encourage cessation. Added nicotine patch.  12. H/o anxiety disorder: On Remeron and Zoloft daily.  13. Advanced OA right wrist: Continue splint with local measures.  15. Hypokalemia- improved post supplementation- recheck 6/5  Potassium 2.6 on 6/1, supplemented with IV therapy on 6/1 and daily supplementation started on 6/1   16. Hypomagnesemia  Magnesium 1.6 and 6/1  Supplement initiated on 6/2- recheck 6/5  LOS (Days) 4 A FACE TO FACE EVALUATION WAS PERFORMED  Erick Colace 03/28/2018 7:52 AM

## 2018-03-28 NOTE — Progress Notes (Signed)
Occupational Therapy Session Note  Patient Details  Name: Pam Johnson MRN: [id number] Date of Birth: 07/17/1951  Today's Date: 03/28/2018 OT Individual Time: 1103-1200 OT Individual Time Calculation (min): 57 min    Short Term Goals: Week 1:  OT Short Term Goal 1 (Week 1): STGs=LTGs due to ELOS  Skilled Therapeutic Interventions/Progress Updates:     Pt seen for OT session focusing on ADL re-training, functional ambulation, attention to task and functional standing balance/endurance. Pt sitting up in w/c upon arrival, complaints of general pain- alerted RN and pain medication administered at start of session. Pt ambulated throughout session with min A, verbal and then visual cues provided for midline orientation of RW as pt's L field cut impacting safe and functional use of RW. Placed green [place] at center of RW for visual orientation.  She ambulated into bathroom and completed toileting task with guarding assist. Returned to sink to complete hand hygiene and oral care standing at sink, VCs to slow down for improved performance as pt anxious with all tasks leading to clumsiness.  Following seated rest break, she ambulated to therapy gym in same manner as described above. [facility] required to match playing card onto mirror board focusing on attention to task, attention to L with compensatory techniques for field cut. Pt able to complete dynamic standing without UE support and close supervision. Difficulty with card orientation, placing card sideways to target etc, and requiring multimodal cuing to correct.  Pt returned to room in same manner as described above. Returned to sitting in w/c, QRB donned. Set-up assist required for lunch, pt with decreased awareness of L UE placement, dragging hand through food without awareness, etc.   Therapy Documentation Precautions:  Precautions Precautions: Fall Precaution Comments: Lt inattention, visual deficits   Restrictions Weight Bearing Restrictions: No ADL: ADL ADL Comments: Please see functional navigator for ADL status  See Function Navigator for Current Functional Status.   Therapy/Group: Individual Therapy  Mckoy Bhakta L 03/28/2018, 7:06 AM

## 2018-03-28 NOTE — Progress Notes (Signed)
Recreational Therapy Session Note  Patient Details  Name: Pam Johnson MRN: [id number] Date of Birth: 05/12/1951 Today's Date: 03/28/2018  Pain: intermittent c/o of back pain & arthritic pain, unrated.  Rest breaks provided relief Skilled Therapeutic Interventions/Progress Updates: session focused on activity tolerance, anxiety management (deep breathing techniques at rest and during activity), ambulation with rollator, ball toss activity on tile and then compliant surface with UE.  Pt required contact guard assistance for all activity.  Pt most limited by cardiorespiratory endurance and anxiety with activity.  Pt with multiple questions about medicines used to assist with anxiety management.  Relayed message to pt's PA to address.  Pt appreciative.  Pt's son and his friend participatory and observing first half of the session.  Therapy/Group: Co-Treatment  Zahir Eisenhour 03/28/2018, 4:07 PM

## 2018-03-28 NOTE — Progress Notes (Signed)
Speech Language Pathology Daily Session Note  Patient Details  Name: Townsend RogerConstance M Giambalvo MRN: [id number] Date of Birth: 11/29/1950  Today's Date: 03/28/2018 SLP Individual Time: [id number] SLP Individual Time Calculation (min): 45 min  Short Term Goals: Week 1: SLP Short Term Goal 1 (Week 1): Pt will consume dys 3 trials with efficient mastication and no overt s/s aspiration to demonstrate readiness of diet advancement, SLP Short Term Goal 2 (Week 1): Pt will demonstrate selective attention in moderately distracting environment for 45 minutes with min A verbal cues. SLP Short Term Goal 3 (Week 1): Pt will demonstrate functional problem solving for basic and familar (mildly complex) tasks with min A verbal cues. SLP Short Term Goal 4 (Week 1): Pt will demonstarte compensatory strategies for visual deficits during functional problem solving tasks with min A verbal cues., SLP Short Term Goal 5 (Week 1): Pt will self-monitor and correct functional errors with min A verbal cues.   Skilled Therapeutic Interventions:Skilled ST services focused on cognitive skills. SLP facilitated basic money management of counting change, pt required max A verbal cues for selective attention in 5 minute intervals due to internal distractions, mod A verbal cues for organization/problem solving and min A verbal cues for recall during functional task. SLP educated pt in strategies to control internal distractions, pt completed return demonstration in structured task. Pt was left in room with call bell within reach. Reccomend to continue skilled ST services.   Function:  Eating Eating                 Cognition Comprehension Comprehension assist level: Follows basic conversation/direction with no assist  Expression   Expression assist level: Expresses basic needs/ideas: With extra time/assistive device  Social Interaction Social Interaction assist level: Interacts appropriately 90% of the time - Needs monitoring or  encouragement for participation or interaction.  Problem Solving Problem solving assist level: Solves basic 75 - 89% of the time/requires cueing 10 - 24% of the time  Memory Memory assist level: Recognizes or recalls 75 - 89% of the time/requires cueing 10 - 24% of the time    Pain Pain Assessment Pain Score: 0-No pain  Therapy/Group: Individual Therapy  Kimmarie Pascale  [facility] 03/28/2018, 4:58 PM

## 2018-03-28 NOTE — Progress Notes (Addendum)
Physical Therapy Session Note  Patient Details  Name: Pam Johnson MRN: [id number] Date of Birth: 04/29/1951  Today's Date: 03/28/2018 PT Individual Time: 1417-1530 PT Individual Time Calculation (min): 73 min  and Today's Date: 03/28/2018 PT Concurrent Time: 1530-1545 PT Concurrent Time Calculation (min): 15 min  Short Term Goals: Week 1:  PT Short Term Goal 1 (Week 1): =LTG due to estimated LOS  Skilled Therapeutic Interventions/Progress Updates:  Pt received in bed & agreeable to tx, no c/o pain reported. Pt seen in conjunction with recreational therapist. Pt in bed & performs all bed mobility (supine<>sit) with hospital bed features and mod I. During session pt reports need to use restroom x 2 times & completes toileting with close supervision and RW then rollator, with continent void x 2. Pt performs hand hygiene standing at sink with close supervision. Pt performs grooming tasks (removes dentures) with close supervision<>steady assist for standing balance. Pt's family present for session. In gym therapist provided pt with rollator to try and pt ambulated >150 ft with device with steady assist and intermittent bouts of supervision & pt reporting "I feel more safe" with this AD. Pt already has rollator at home and therapist recommends pt use rollator at all times upon d/c. Pt engaged in ball toss to L/R while standing on compliant & noncompliant surfaces with close supervision<>steady assist for balance with task focusing on standing tolerance, balance, and L attention/scanning. Pt only able to stand 30 seconds - 1 minute at a time before requiring rest break 2/2 pt report SOB but SpO2 > or = 95% throughout session. Therapists continue to educate pt on pursed lip breathing during activity and also focused on ways to reduce pt's anxiety with task. Pt reporting fatigue after returning to room to void 2nd time but willing to continue. Pt ambulates room<>dayroom with rollator and uses nu-step on level  1 x 10 minutes with 3-4 rest breaks 2/2 fatigue; tasks focuses on coordination of reciprocal movements and endurance training.  Last 15 minutes of session pt seen for concurrent session. Pt completed sit<>stand transfers (5 reps at a time x 2-3 sets) from w/c with BUE support with task focusing on BLE strengthening & endurance training. Discussed pt's need to void frequently and recommended pt place night lights in house to increase safety when going to the bathroom at night. Pt returns to bed in room & left in bed with alarm set & all needs within reach, husband present to supervise.   During session pt required cuing when exiting bathroom to attend to L side of rollator as pt bumping it on doorframe but with decreased attention & safety awareness.   Therapy Documentation Precautions:  Precautions Precautions: Fall Precaution Comments: Lt inattention, visual deficits  Restrictions Weight Bearing Restrictions: No    See Function Navigator for Current Functional Status.   Therapy/Group: Individual Therapy and concurrent  Pam Johnson 03/28/2018, 6:15 PM

## 2018-03-29 ENCOUNTER — Inpatient Hospital Stay (HOSPITAL_COMMUNITY): Payer: Self-pay

## 2018-03-29 ENCOUNTER — Encounter (HOSPITAL_COMMUNITY): Payer: Self-pay | Admitting: Psychology

## 2018-03-29 ENCOUNTER — Inpatient Hospital Stay (HOSPITAL_COMMUNITY): Payer: Self-pay | Admitting: Occupational Therapy

## 2018-03-29 ENCOUNTER — Inpatient Hospital Stay (HOSPITAL_COMMUNITY): Payer: Medicare Other | Admitting: Physical Therapy

## 2018-03-29 LAB — CBC
HEMATOCRIT: 37.8 % (ref 36.0–46.0)
HEMOGLOBIN: 12.2 g/dL (ref 12.0–15.0)
MCH: 29.8 pg (ref 26.0–34.0)
MCHC: 32.3 g/dL (ref 30.0–36.0)
MCV: 92.4 fL (ref 78.0–100.0)
Platelets: 492 10*3/uL — ABNORMAL HIGH (ref 150–400)
RBC: 4.09 MIL/uL (ref 3.87–5.11)
RDW: 15.4 % (ref 11.5–15.5)
WBC: 6.4 10*3/uL (ref 4.0–10.5)

## 2018-03-29 LAB — MAGNESIUM: Magnesium: 2.2 mg/dL (ref 1.7–2.4)

## 2018-03-29 MED ORDER — CLONAZEPAM 0.5 MG PO TABS: 0.2500 mg | ORAL_TABLET | Freq: Two times a day (BID) | ORAL | Status: DC | PRN

## 2018-03-29 MED ORDER — AMLODIPINE BESYLATE 10 MG PO TABS
10.0000 mg | ORAL_TABLET | Freq: Every day | ORAL | Status: DC
  Administered 2018-03-30 – 2018-04-01 (×3): 10 mg via ORAL
  Filled 2018-03-29 (×3): qty 1

## 2018-03-29 MED ORDER — CLONAZEPAM 0.25 MG PO TBDP
0.2500 mg | ORAL_TABLET | Freq: Two times a day (BID) | ORAL | Status: DC | PRN
  Administered 2018-03-29 – 2018-04-01 (×7): 0.25 mg via ORAL
  Filled 2018-03-29 (×7): qty 1

## 2018-03-29 NOTE — Progress Notes (Signed)
[place] PHYSICAL MEDICINE & REHABILITATION     PROGRESS NOTE  Subjective/Complaints:   SLP working on complex problem solving, $ management and reassessment of swallow  ROS: denies CP, SOB, nausea, vomiting, diarrhea.  Objective: Vital Signs: Blood pressure (!) 188/78, pulse 68, temperature 98 F (36.7 C), temperature source Oral, resp. rate 18, height 5' 4" (1.626 m), weight 51 kg (112 lb 7 oz), SpO2 99 %. No results found. Recent Labs    03/29/18 0500  WBC 6.4  HGB 12.2  HCT 37.8  PLT 492*   Recent Labs    03/26/18 1101  NA 137  K 3.7  CL 101  GLUCOSE 103*  BUN 6  CREATININE 0.53  CALCIUM 9.0   CBG (last 3)  No results for input(s): GLUCAP in the last 72 hours.  Wt Readings from Last 3 Encounters:  03/29/18 51 kg (112 lb 7 oz)  03/22/18 51.4 kg (113 lb 5.1 oz)  01/27/16 51.3 kg (113 lb)    Physical Exam:  BP (!) 188/78 (BP Location: Right Arm)   Pulse 68   Temp 98 F (36.7 C) (Oral)   Resp 18   Ht 5' 4" (1.626 m)   Wt 51 kg (112 lb 7 oz)   SpO2 99%   BMI 19.30 kg/m  Constitutional: No distress. Nasal cannula in place. Frail appearing  HENT: Ecchymotic areas on left forehead (with induration and scabbing) and bilateral cheeks.  Eyes: EOMI. No discharge. Cardiovascular: RRR. No JVD. Respiratory: unlabored. Barrel chested. Intermittent wheezing.  Neurological: alert and oriented. +  Fair insight and awareness.  Motor: LUE 4+/5 prox to distal.  LLE 4/5 proximally, 4+/5 distally RUE/RLE 5/5 (stable).  Skin: She is not diaphoretic. BUE with multiple healed skin tears and ecchymotic areas. Bilateral shins with multiple petechial areas (due to trauma)  Psychiatric: She has a normal mood and affect. Her behavior is normal.   Assessment/Plan: 1. Functional deficits secondary to right acute/subacute parietal-occipital lobe infarct which require 3+ hours per day of interdisciplinary therapy in a comprehensive inpatient rehab setting. Physiatrist is  providing close team supervision and 24 hour management of active medical problems listed below. Physiatrist and rehab team continue to assess barriers to discharge/monitor patient progress toward functional and medical goals.  Function:  Bathing Bathing position Bathing activity did not occur: Refused Position: Production manager parts bathed by patient: Right arm, Left arm, Chest, Abdomen, Front perineal area, Buttocks, Right upper leg, Left upper leg, Right lower leg, Left lower leg Body parts bathed by helper: Back  Bathing assist Assist Level: Supervision or verbal cues      Upper Body Dressing/Undressing Upper body dressing   What is the patient wearing?: Pull over shirt/dress     Pull over shirt/dress - Perfomed by patient: Thread/unthread right sleeve, Thread/unthread left sleeve, Put head through opening, Pull shirt over trunk          Upper body assist Assist Level: Supervision or verbal cues      Lower Body Dressing/Undressing Lower body dressing   What is the patient wearing?: Underwear, Pants, Non-skid slipper socks Underwear - Performed by patient: Thread/unthread right underwear leg, Thread/unthread left underwear leg, Pull underwear up/down   Pants- Performed by patient: Thread/unthread right pants leg, Thread/unthread left pants leg, Pull pants up/down   Non-skid slipper socks- Performed by patient: Don/doff right sock, Don/doff left sock   Socks - Performed by patient: Don/doff right sock, Don/doff left sock   Shoes - Performed by  patient: Don/doff left shoe, Fasten left Shoes - Performed by helper: Don/doff right shoe, Fasten right          Lower body assist Assist for lower body dressing: Supervision or verbal cues      Toileting Toileting Toileting activity did not occur: No continent bowel/bladder event Toileting steps completed by patient: Adjust clothing after toileting Toileting steps completed by helper: Adjust clothing prior to  toileting, Performs perineal hygiene, Adjust clothing after toileting(per Candice, NT report) Toileting Assistive Devices: Grab bar or rail  Toileting assist Assist level: Touching or steadying assistance (Pt.75%)   Transfers Chair/bed transfer   Chair/bed transfer method: Ambulatory Chair/bed transfer assist level: Touching or steadying assistance (Pt > 75%) Chair/bed transfer assistive device: Walker(rollator)     Locomotion Ambulation     Max distance: >150 ft Assist level: Touching or steadying assistance (Pt > 75%)   Wheelchair Wheelchair activity did not occur: N/A        Cognition Comprehension Comprehension assist level: Follows basic conversation/direction with no assist  Expression Expression assist level: Expresses basic needs/ideas: With extra time/assistive device  Social Interaction Social Interaction assist level: Interacts appropriately 90% of the time - Needs monitoring or encouragement for participation or interaction.  Problem Solving Problem solving assist level: Solves basic 75 - 89% of the time/requires cueing 10 - 24% of the time  Memory Memory assist level: Recognizes or recalls 75 - 89% of the time/requires cueing 10 - 24% of the time    Medical Problem List and Plan:  1. Functional and mobility deficits secondary to right acute/subacute parietal-occipital lobe infarct on 5/29-Poor attention to Left side  ASA and Clopidigrel  Continue CIR PT, OT, SLP, Team conference today please see physician documentation under team conference tab, met with team face-to-face to discuss problems,progress, and goals. Formulized individual treatment plan based on medical history, underlying problem and comorbidities. 2. DVT Prophylaxis/Anticoagulation: Pharmaceutical: Lovenox plt slighly elevated 492K 3. 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   Resumed OxyContin at 10 mg bid and  monitor for tolerance.  Continue hydrocodone prn Took 3-4 tabs per day at Home- currently on 7.96m q6h prn 4. Mood: LCSW to follow for evaluation and support.  5. Neuropsych: This patient is capable of making decisions on her own behalf.  6. Skin/Wound Care: routine pressure relief measures.  7. Fluids/Electrolytes/Nutrition: Monitor I/O. 6369mrecorded yest, BUN 5, may d/c IV   D3 thins, advance diet as tolerated 8. COPD: Last exacerbation 12/2017. Continue Dulera bid, Singulair and spiriva  9. HTN: Monitor BP bid. 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   On Norvasc 2.5 PTA, restarted on 6/2- increase to 67m18mn 6/3 will increase to 30m67mHydralazine when necessary   Vitals:   03/29/18 0518 03/29/18 0656  BP: (!) 209/93 (!) 188/78  Pulse: 68   Resp: 18   Temp: 98 F (36.7 C)   SpO2: 99%    10. 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 Tobacco abuse: Continue to encourage cessation. Added nicotine patch.  12. H/o anxiety disorder: On Remeron and Zoloft daily.  13. Advanced OA right wrist: Continue splint with local measures.  15. Hypokalemia- improved post supplementation- recheck 6/5  Potassium 2.6 on 6/1, supplemented with IV therapy on 6/1 and daily supplementation started on 6/1   16. Hypomagnesemia  Magnesium 1.6 and 6/1  Supplement initiated on  6/2- recheck 6/5  LOS (Days) 5 A FACE TO FACE EVALUATION WAS PERFORMED  Charlett Blake 03/29/2018 8:09 AM

## 2018-03-29 NOTE — Consult Note (Signed)
Neuropsychological Consultation   Patient:   Pam Johnson   DOB:   12/02/1950  MR Number:  [id number]  Location:  [facility] [id number] [place] [address] Dept: [phone] Loc: [phone]           Date of Service:   03/29/2018  Start Time:   11 AM End Time:   12 PM  Provider/Observer:  Pam Phenix, Psy.D.       Clinical Neuropsychologist       Billing Code/Service: [phone] 4 Units  Chief Complaint:    Pam Johnson is a 67 year old female with history of HTN, chronic pain due to spinal stenosis, COPD, and recent fall.  Admitted on 03/22/2018 with left sided weakness, left neglect and facial droop.  USD positive for THC and opiates (prescribed).  CT showing moderate acute infarct in right occipital lobe.  Patient reports that she has been told of past "mini strokes" and development of vision changes even before most recent CVA.  Patient has prior history of anxiety disorder and reports exacerbation of anxiety with CVA and hospitalization.  Patient reports that medications for anxiety have helped but wants to learn how to reduce anxiety in other ways than medications.  Patient's anxiety to point that she was unalbe to do MRI due to claustrophobia.  Patient overtly denied marijuana use and this issues was not addressed further.    Reason for Service:  Pam Johnson was referred for neuropsychological consultation due to adjustment and coping issues along with anxiety.   Below is the HPI for the current admission.  HPI: Pam Johnson is a 67 year old female with history of HTN, chronic pain due to spinal stenosis (Dr. Comer Locket) , COPD, recent fall who was admitted on 03/22/18 with left sided weakness, tendency to run into things on the left for few days and facial droop. UDS positive for THC and opiates. 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 She underwent CTA angio with unsuccessful revascularization of acutely occluded prox R-ICA. She had hypotensive episode while in CT scanner and was treated with fluid bolus. Repeat CT head showed mild evolution with right cytotoxic edema and right hemisphere core infarct perhaps doubled in size and interval development of L-MCA thromboembolism --finding not supported by exam per neuro. Dr. Pearlean Brownie recommends ASA/plavix for 3 months followed by ASA alone.  MRI brain not done due to claustrophobia.  2D echo done today. Patient with resultant left sided weakness, left inattention and difficulty performing tasks. CIR recommended due to functional deficits.   Current Status:  Patient reports that she is having a lot of anxiety and difficulty falling asleep.  She reports that both klonopin and Ambien have helped with anxiety and sleep.  Patient reports that she wants to work on other coping skills for anxiety.  This was done to some degree during visit today.  Patient is aware of visual neglect and left sided inattention and she is learning to more consistently adjust for these deficits.  Memory and cognitive appear generally intact.    Behavioral Observation: Pam Johnson  presents as a 67 y.o.-year-old Right Caucasian Female who appeared her stated age. her dress was  Appropriate and she was Well Groomed and her manners were Appropriate to the situation.  her participation was indicative of Appropriate and Attentive behaviors.  There were any physical disabilities noted.  she displayed an appropriate level of cooperation and motivation.     Interactions:    Active Appropriate and Attentive  Attention:   within normal limits and attention  span and concentration were age appropriate  Memory:   within normal limits; recent and remote memory intact  Visuo-spatial:  While not directly examamed today, the patient does have visual neglect and cortical blindness for left visual field.    Speech (Volume):  normal  Speech:   normal; normal  Thought Process:  Coherent and Relevant  Though Content:  WNL; not suicidal and not homicidal  Orientation:   person, place, time/date and situation  Judgment:   Fair  Planning:   Fair  Affect:    Anxious  Mood:    Anxious  Insight:   Fair  Medical History:   Past Medical History:  Diagnosis Date  . Acid reflux   . COPD (chronic obstructive pulmonary disease) (HCC)   . Hypertension   . Sacral fracture (HCC)   . Spinal stenosis         Psychiatric History:  Patient does report significant prior history of anxiety and chronic pain.  Family Med/Psych History:  Family History  Problem Relation Age of Onset  . Hypertension Mother   . Hypertension Father     Risk of Suicide/Violence: low Patient denies SI or HI  Impression/DX:  Pam Johnson is a 67 year old female with history of HTN, chronic pain due to spinal stenosis, COPD, and recent fall.  Admitted on 03/22/2018 with left sided weakness, left neglect and facial droop.  USD positive for THC and opiates (prescribed).  CT showing moderate acute infarct in right occipital lobe.  Patient reports that she has been told of past "mini strokes" and development of vision changes even before most recent CVA.  Patient has prior history of anxiety disorder and reports exacerbation of anxiety with CVA and hospitalization.  Patient reports that medications for anxiety have helped but wants to learn how to reduce anxiety in other ways than medications.  Patient's anxiety to point that she was unalbe to do MRI due to claustrophobia.  Patient overtly denied marijuana use and this issues was not addressed further.   Patient reports that she  is having a lot of anxiety and difficulty falling asleep.  She reports that both klonopin and Ambien have helped with anxiety and sleep.  Patient reports that she wants to work on other coping skills for anxiety.  This was done to some degree during visit today.  Patient is aware of visual neglect and left sided inattention and she is learning to more consistently adjust for these deficits.  Memory and cognitive appear generally intact.          Electronically Signed   _______________________ Pam Johnson, Psy.D.

## 2018-03-29 NOTE — Progress Notes (Signed)
Speech Language Pathology Daily Session Note  Patient Details  Name: Pam Johnson MRN: [id number] Date of Birth: 10/31/1950  Today's Date: 03/29/2018 SLP Individual Time: [phone] SLP Individual Time Calculation (min): 30 min and 43 min  Short Term Goals: Week 1: SLP Short Term Goal 1 (Week 1): Pt will consume dys 3 trials with efficient mastication and no overt s/s aspiration to demonstrate readiness of diet advancement, SLP Short Term Goal 1 - Progress (Week 1): Discontinued (comment)(least restrictive diet without dentures and pt preference ) SLP Short Term Goal 2 (Week 1): Pt will demonstrate sustained attention during functional problem solving tasks with Mod A verbal cues. SLP Short Term Goal 2 - Progress (Week 1): Revised due to lack of progress SLP Short Term Goal 3 (Week 1): Pt will demonstrate functional problem solving for basic and familar (mildly complex) tasks with Mod A verbal cues. SLP Short Term Goal 3 - Progress (Week 1): Revised due to lack of progress SLP Short Term Goal 4 (Week 1): Pt will demonstarte compensatory strategies for visual deficits during functional problem solving tasks with Mod A verbal cues., SLP Short Term Goal 4 - Progress (Week 1): Revised due to lack of progress SLP Short Term Goal 5 (Week 1): Pt will self-monitor and correct functional errors with Mod A verbal cues.  SLP Short Term Goal 5 - Progress (Week 1): Revised due to lack of progress  Skilled Therapeutic Interventions: 1#Skilled ST services focused on swallow and cognitive skills. SLP facilitated swallow skills on dys 3 and dys 2 trials without denture, due pt stated at home she worn dentures to consume "regular textures" only otherwise consumed without dentures. Pt demonstrated continued oral pain with dys 3 trial and requested dys 2 due to ease of mastication with present sore. Pt demonstrates no swallow impairment and discontinued swallow goal. SLP educated pt about dys 2 textured  foods to consume at home upon discharge, pt stated understanding. SLP facilitated basic money management task, pt required max A verbal cues for sustained attention to task, problem solving due to impairment in organization. Pt demonstrated greater function preforming verbal problem solving tasks in money management compared to functional performances, likely due to visual deficits and sustain attention while compensating for deficits.  Pt was left in room with call bell within reach. Reccomend to continue skilled ST services.   2#Skilled ST services focused on cognitive skills. SLP facilitated problem solving, organization and sustained attention in 3-4 step sequencing card tasks, pt required max A verbal cues for 4 cards and mod A verbal cues for 3 cards. Pt continues to require redirection during basic tasks due to sustained attention impairment with internal distractions, seemly evoked by axenity, which daughter noted attention as mild impairment prior to stroke on evaluation date. SLP downgraded STG and LTG due to lack of progress and short ELOS. Pt will continue to require skilled ST services at next level of care. Pt was left in room with call bell within reach. Reccomend to continue skilled ST services.      Function:  Eating Eating   Modified Consistency Diet: Yes Eating Assist Level: More than reasonable amount of time           Cognition Comprehension Comprehension assist level: Follows basic conversation/direction with no assist  Expression   Expression assist level: Expresses basic needs/ideas: With extra time/assistive device  Social Interaction Social Interaction assist level: Interacts appropriately 90% of the time - Needs monitoring or encouragement for participation or interaction.  Problem Solving Problem solving assist level: Solves basic 25 - 49% of the time - needs direction more than half the time to initiate, plan or complete simple activities;Solves basic 50 - 74% of the  time/requires cueing 25 - 49% of the time  Memory Memory assist level: Recognizes or recalls 75 - 89% of the time/requires cueing 10 - 24% of the time;Recognizes or recalls 50 - 74% of the time/requires cueing 25 - 49% of the time    Pain Pain Assessment Pain Scale: 0-10 Pain Score: 0-No pain Pain Type: Chronic pain Pain Location: Back Pain Descriptors / Indicators: Aching Pain Frequency: Constant Pain Onset: On-going Patients Stated Pain Goal: 2 Pain Intervention(s): Medication (See eMAR)  Therapy/Group: Individual Therapy  Manami Tutor 03/29/2018, 3:00 PM

## 2018-03-29 NOTE — Patient Care Conference (Signed)
Inpatient RehabilitationTeam Conference and Plan of Care Update Date: 03/29/2018   Time: 10:15 AM    Patient Name: Pam Johnson      Medical Record Number: [id number]  Date of Birth: 12-Sep-1951 Sex: Female         Room/Bed: 4W20C/4W20C-01 Payor Info: Payor: MEDICARE / Plan: MEDICARE PART A AND B / Product Type: *No Product type* /    Admitting Diagnosis: CVA  Admit Date/Time:  03/24/2018  3:46 PM Admission Comments: No comment available   Primary Diagnosis:  <principal problem not specified> Principal Problem: <principal problem not specified>  Patient Active Problem List   Diagnosis Date Noted  . Hypokalemia   . Hypomagnesemia   . Dysphagia, post-stroke   . Hypertensive urgency   . Acute blood loss anemia   . Chronic obstructive pulmonary disease ([place])   . Chronic pain syndrome   . Acute ischemic right MCA stroke ([place]) 03/24/2018  . 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 Essential hypertension   . Stroke (cerebrum) ([place]) 03/22/2018  . 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 Pneumonia 10/18/2015    Expected Discharge Date: Expected Discharge Date: 04/01/18  Team Members Present: Physician leading conference: Dr. Alysia Penna Social Worker Present: Ovidio Kin, LCSW Nurse Present: Other (comment)(Latoya Foote-RN) PT Present: Lavone Nian, PT OT Present: Napoleon Form, OT SLP Present: Charolett Bumpers, SLP PPS Coordinator present : Daiva Nakayama, RN, CRRN     Current Status/Progress Goal Weekly Team Focus  Medical   Chronic anxiety, chronic pain.  Taking less pain medicine then at home.,  Blood pressure is labile  Maintain medical stability, adequate pain relief without oversedation  Blood pressure management, anxiety management   Bowel/Bladder        Cont B & B     Swallow/Nutrition/ Hydration    dys 2 and thin , due to pain with dentures  Mod I - upgraded and goal met no swallowing issue  education on wearing and not wearing dentures and appropriate  textures   ADL's   Min A overall with cuing for safety; L field cut impacting safety with ADLs and mobility  Supervision overall  ADL re-training, neuro re-ed, visual compensatory techniques; family ed; d/c planning   Mobility   steady<>min assist overall, decreased endurance 2/2 SOB, L inattention/visual deficits, impulsive  supervision overall with LRAD  L attention/compensatory strategies for vision, balance, transfers, gait, NMR, coordination, stair negotiation, endurance training & strengthening   Communication             Safety/Cognition/ Behavioral Observations  Max- Mod A - greater diffculity functioonally then verbally in problem solving   mod A - downgraded 6/5  selective attention / internal distratcions/anexity, mildly compelx problem solving, memory, emergent awareness    Pain        less than 3-takes tylenol and MD limiting narcotics-goes to chronic pain clinic     Skin        monitor skin no issues at this time        *See Care Plan and progress notes for long and short-term goals.     Barriers to Discharge  Current Status/Progress Possible Resolutions Date Resolved   Physician    Medical stability     Progressing towards goals  Continue rehabilitation program, medication management      Nursing                  PT  Other (comments);Behavior  visual impairments, pt slightly impulsive  OT                  SLP                SW                Discharge Planning/Teaching Needs:  HOme with husband who can provide light min due to his own health issues. He has been here daily and observed in therapies.      Team Discussion:  Goals supervision level has decreased endurance and her anxiety distracts her. L-field cut and MD dealing with her chronic pain issues and pain meds. Weaned off O2. Husband was here yesterday for therapies.Pt making progress in therapies. Impulsive at times.  Revisions to Treatment Plan:  DC 6/8    Continued Need for Acute  Rehabilitation Level of Care: The patient requires daily medical management by a physician with specialized training in physical medicine and rehabilitation for the following conditions: Daily direction of a multidisciplinary physical rehabilitation program to ensure safe treatment while eliciting the highest outcome that is of practical value to the patient.: Yes Daily medical management of patient stability for increased activity during participation in an intensive rehabilitation regime.: Yes Daily analysis of laboratory values and/or radiology reports with any subsequent need for medication adjustment of medical intervention for : Neurological problems  Kimbley Sprague, Gardiner Rhyme 03/30/2018, 10:14 AM

## 2018-03-29 NOTE — Progress Notes (Addendum)
Physical Therapy Session Note  Patient Details  Name: Pam Johnson MRN: [id number] Date of Birth: 12/17/1950  Today's Date: 03/29/2018 PT Individual Time: 1435-1546 PT Individual Time Calculation (min): 71 min   Short Term Goals: Week 1:  PT Short Term Goal 1 (Week 1): =LTG due to estimated LOS  Skilled Therapeutic Interventions/Progress Updates:  Pt received sitting EOB wishing to doff shorts & don pants and did so with supervision from therapist. Pt reports 8/10 back pain, RN in room administering meds. Pt also reports muscle soreness across body. Pt voices need to void and ambulates in room, bathroom with rollator and supervision with cuing to attend to L visual field. Pt and husband report she does not want to be on Klonopin & PA (Pam) made aware.  Pt ambulates throughout unit with RW & supervision with rollator, intermittently practicing sit<>stand transfers from AD with cuing for rollator brake management. Pt completed car transfer from sedan simulated height with supervision. Pt completed Berg Balance Test & scored [phone]/[id number]; educated pt on interpretation of score & current fall risk as well as safety recommendations (ambulate with RW & supervision). Patient demonstrates increased fall risk as noted by score of 38/56 on Berg Balance Scale.  (<36= high risk for falls, close to 100%; 37-45 significant >80%; 46-51 moderate >50%; 52-55 lower >25%). Throughout session pt's husband present and provided supervision and appropriate cuing for rollator brake management throughout session. Pt reqires cuing to slow down 2/2 impulsivity and cuing to safely sit on chair as pt almost sat on edge 2/2 L vision deficits. Therapist made recommendations to remove throw rugs and ensure clear paths to reduce tripping hazards in their home. At end of session pt left in bed with alarm set, husband present, needs within reach.  Therapy Documentation Precautions:  Precautions Precautions: Fall Precaution Comments: Lt  inattention, visual deficits  Restrictions Weight Bearing Restrictions: No    Balance: Balance Balance Assessed: Yes Standardized Balance Assessment Standardized Balance Assessment: Berg Balance Test Berg Balance Test Sit to Stand: Able to stand without using hands and stabilize independently Standing Unsupported: Able to stand 2 minutes with supervision Sitting with Back Unsupported but Feet Supported on Floor or Stool: Able to sit safely and securely 2 minutes Stand to Sit: Sits safely with minimal use of hands Transfers: Able to transfer with verbal cueing and /or supervision(max cuing 2/2 L visual deficits) Standing Unsupported with Eyes Closed: Able to stand 10 seconds with supervision Standing Ubsupported with Feet Together: Able to place feet together independently and stand for 1 minute with supervision From Standing, Reach Forward with Outstretched Arm: Can reach forward >12 cm safely (5") From Standing Position, Pick up Object from Floor: Able to pick up shoe, needs supervision From Standing Position, Turn to Look Behind Over each Shoulder: Looks behind from both sides and weight shifts well Turn 360 Degrees: Needs close supervision or verbal cueing Standing Unsupported, Alternately Place Feet on Step/Stool: Able to complete 4 steps without aid or supervision(completes 8 steps but with close supervision) Standing Unsupported, One Foot in Front: Needs help to step but can hold 15 seconds Standing on One Leg: Tries to lift leg/unable to hold 3 seconds but remains standing independently Total Score: 38    See Function Navigator for Current Functional Status.   Therapy/Group: Individual Therapy  Sandi MariscalVictoria M Miller 03/29/2018, 4:18 PM

## 2018-03-29 NOTE — Progress Notes (Signed)
Occupational Therapy Session Note  Patient Details  Name: Pam Johnson [place]ll MRN: [id number] Date of Birth: 09/06/1951  Today's Date: 03/29/2018 OT Individual Time: 1300-1400 OT Individual Time Calculation (min): 60 min    Short Term Goals: Week 1:  OT Short Term Goal 1 (Week 1): STGs=LTGs due to ELOS  Skilled Therapeutic Interventions/Progress Updates:    Pt seen for OT ADL bathing/dressing session. Pt sitting up in bed upon arrival, agreeable to tx session and desiring tos however. She ambulated throughout session with use of rollator, required max cuing throughout session for management of rollator brakes during sit<> stands and rollator management during functional transfers. She gathered clothing items in prep for shower transfer, ambulating to dresser and retrieving items from low drawers with close supervision. She completed toileting task with close supervision and transitioned into tub bench in shower stall requiring increased cues for safety awareness and rollator management. She bathed seated on tub bench, demonstrates perceptual deficits when attempting to pour soap onto washcloth. She ambulated out of shower and dressed seated on standard chair. Grooming tasks completed standing at sink with supervision.  She ambulated to ADL apartment, one instance of running into environmental obstacle on L without awareness and requiring max multimodal cuing for awareness and safety. In apartment, completed sit<> Stand transfer from low soft surface couch with supervision in simulation of home environment.  Completed simulated tub/shower transfer utilizing tub bench- supervision following demonstration. Pt reports she will not be able to finicially afford tub transfer bench and it will not be covered by insurance. She has shower chair. Therefore, practiced stepping over tub wall to shower chair, completed with guarding assisst and multimodal cuing following demonstration. Pt requiring increased cuing to  attend to task due to impaired sustained attention as well as anxiety. Pt returned to room at end of session, left seated in standard chair with hand off to SLP.   Therapy Documentation Precautions:  Precautions Precautions: Fall Precaution Comments: Lt inattention, visual deficits  Restrictions Weight Bearing Restrictions: No Pain:  No/denies pain ADL: ADL ADL Comments: Please see functional navigator for ADL status  See Function Navigator for Current Functional Status.   Therapy/Group: Individual Therapy  Reve Crocket L 03/29/2018, 7:17 AM

## 2018-03-29 NOTE — Progress Notes (Signed)
Social Work Patient ID: Pam Johnson, female   DOB: 08-18-1951, 67 y.o.   MRN: [id number]  Met with pt to discuss team conference goals supervision level and discharge date 6/8. She is pleased with her progress and feels she is doing better but needs reassurance. Neuro-psych to see today for coping. Has equipment and will have [facility] provide follow up therapies since she had them before.

## 2018-03-30 ENCOUNTER — Inpatient Hospital Stay (HOSPITAL_COMMUNITY): Payer: Self-pay | Admitting: Occupational Therapy

## 2018-03-30 ENCOUNTER — Inpatient Hospital Stay (HOSPITAL_COMMUNITY): Payer: Self-pay | Admitting: Speech Pathology

## 2018-03-30 ENCOUNTER — Inpatient Hospital Stay (HOSPITAL_COMMUNITY): Payer: Self-pay | Admitting: Physical Therapy

## 2018-03-30 MED ORDER — HYDROCODONE-ACETAMINOPHEN 7.5-325 MG PO TABS
1.0000 | ORAL_TABLET | Freq: Three times a day (TID) | ORAL | Status: DC | PRN
  Administered 2018-03-30 – 2018-03-31 (×2): 1 via ORAL
  Filled 2018-03-30 (×2): qty 1

## 2018-03-30 NOTE — Progress Notes (Signed)
Occupational Therapy Session Note  Patient Details  Name: [name] MRN: [id number] Date of Birth: 07/15/1951  Today's Date: 03/30/2018 OT Individual Time: 1100-1158 OT Individual Time Calculation (min): 58 min    Short Term Goals: Week 1:  OT Short Term Goal 1 (Week 1): STGs=LTGs due to ELOS  Skilled Therapeutic Interventions/Progress Updates:    Pt seen for OT session focusing on IADL re-training with emphasis on functional standing balance/endurance. PT in supine upon arrival, agreeable to tx session. She declined bathing/dressing session this morning.  She ambulated throughout session with rollator, requiring cuing for rollator management including breaks.  She completed grooming tasks standing at sink with supervision.  Discussed pt's PLOF including home management tasks and daily routine. Pt reports being primary person for laundry, therefore completed simulated laundry task in pt laundry facility. She was able to place and remove items from top loading washer. She returned to high/low table to fold towels using B UE simultaneously.  She returned to room to cont with IADL tasks, removing sheets from bed and attempted to have her re-make bed. Due to pt's poor safety awareness, arthritis, and anxiety, unsuccessful/unsafe in making bed, therefore pt donned pillow cases and therapist assisted with making bed. Throughout functional tasks, pt required VCs for redirection to current task asl well as seated rest breaks. Pt returned to recliner at end of session, assist provided for est-up, left with chair alarm on and all needs in reach.   Therapy Documentation Precautions:  Precautions Precautions: Fall Precaution Comments: Lt inattention, visual deficits  Restrictions Weight Bearing Restrictions: No Pain: Pain Assessment Pain Scale: 0-10 Pain Score: 9  Pain Type: Chronic pain Pain Location: Back Pain Descriptors / Indicators: Aching Pain Frequency: Intermittent Pain Onset:  On-going Patients Stated Pain Goal: 2 Pain Intervention(s): Medication (See eMAR), RN made aware, increased activity, distraction ADL: ADL ADL Comments: Please see functional navigator for ADL status  See Function Navigator for Current Functional Status.   Therapy/Group: Individual Therapy  Evelina Lore L 03/30/2018, 7:02 AM

## 2018-03-30 NOTE — Progress Notes (Signed)
Physical Therapy Session Note  Patient Details  Name: Pam Johnson MRN: [id number] Date of Birth: 03/14/1951  Today's Date: 03/30/2018 PT Individual Time: 1415-1530 PT Individual Time Calculation (min): 75 min   Short Term Goals: Week 1:  PT Short Term Goal 1 (Week 1): =LTG due to estimated LOS  Skilled Therapeutic Interventions/Progress Updates:    Pt received supine in bed, agreeable to PT. Pt reports some low back pain this PM, not rated. Bed mobility Mod I. Sit to stand Supervision to 4WW, v/c for safe brake management. Ambulation 2 x 200 ft with [id number] and Supervision, focus on environment scanning and engaging in cognitive memory task. Ascend/descend one 6" curb with 4WW and min A for balance, mod verbal cues for safety. Standing balance: ball toss with no UE support 2 x 5 min before onset of fatigue. Nustep level 3 x 15 min with B UE/LE for global endurance. Toilet transfer with Supervision with use of grab bar and 4WW. Pt is able to complete 3/3 toileting steps with SBA. Pt left semi-reclined in bed with needs in reach, bed alarm in place.  Therapy Documentation Precautions:  Precautions Precautions: Fall Precaution Comments: Lt inattention, visual deficits  Restrictions Weight Bearing Restrictions: No  See Function Navigator for Current Functional Status.   Therapy/Group: Individual Therapy   Peter Congoaylor Emilina Smarr, PT, DPT  03/30/2018, 3:58 PM

## 2018-03-30 NOTE — Progress Notes (Addendum)
Social Work Patient ID: Pam Johnson, female   DOB: 06/27/1951, 67 y.o.   MRN: [id number]  Pt got a rolling walker in 2014 and would need to private pay for a rollator. Asked pt and she will private pay for the equipment, the rollator and the tub bench. Son's FMLA paperwork faxed into [employer] have left originals in pt's room she is aware and will give to son when visits.

## 2018-03-30 NOTE — Progress Notes (Signed)
PHYSICAL MEDICINE & REHABILITATION     PROGRESS NOTE  Subjective/Complaints:   No issues overnite, pt sleeping well. ROS: denies CP, SOB, nausea, vomiting, diarrhea.  Objective: Vital Signs: Blood pressure (!) 142/92, pulse 94, temperature 98.2 F (36.8 C), resp. rate 20, height 5\' 4"  (1.626 m), weight 51 kg (112 lb 7 oz), SpO2 96 %. No results found. Recent Labs    03/29/18 0500  WBC 6.4  HGB 12.2  HCT 37.8  PLT 492*   No results for input(s): NA, K, CL, GLUCOSE, BUN, CREATININE, CALCIUM in the last 72 hours.  Invalid input(s): CO CBG (last 3)  No results for input(s): GLUCAP in the last 72 hours.  Wt Readings from Last 3 Encounters:  03/29/18 51 kg (112 lb 7 oz)  03/22/18 51.4 kg (113 lb 5.1 oz)  01/27/16 51.3 kg (113 lb)    Physical Exam:  BP (!) 142/92   Pulse 94   Temp 98.2 F (36.8 C)   Resp 20   Ht 5\' 4"  (1.626 m)   Wt 51 kg (112 lb 7 oz)   SpO2 96%   BMI 19.30 kg/m  Constitutional: No distress. Nasal cannula in place. Frail appearing  HENT: Ecchymotic areas on left forehead (with induration and scabbing) and bilateral cheeks.  Eyes: EOMI. No discharge. Cardiovascular: RRR. No JVD. Respiratory: unlabored. Barrel chested. Intermittent wheezing.  Neurological: alert and oriented. + [place] insight and awareness.  Motor: LUE 4+/5 prox to distal.  LLE 4/5 proximally, 4+/5 distally RUE/RLE 5/5 (stable).  Skin: She is not diaphoretic. BUE with multiple healed skin tears and ecchymotic areas. Bilateral shins with multiple petechial areas (due to trauma)  Psychiatric: She has a normal mood and affect. Her behavior is normal.   Assessment/Plan: 1. Functional deficits secondary to right acute/subacute parietal-occipital lobe infarct which require 3+ hours per day of interdisciplinary therapy in a comprehensive inpatient rehab setting. Physiatrist is providing close team supervision and 24 hour management of active medical problems listed  below. Physiatrist and rehab team continue to assess barriers to discharge/monitor patient progress toward functional and medical goals.  Function:  Bathing Bathing position Bathing activity did not occur: Refused Position: Systems developer parts bathed by patient: Right arm, Left arm, Chest, Abdomen, Front perineal area, Buttocks, Right upper leg, Left upper leg, Right lower leg, Left lower leg, Back Body parts bathed by helper: Back  Bathing assist Assist Level: Supervision or verbal cues      Upper Body Dressing/Undressing Upper body dressing   What is the patient wearing?: Pull over shirt/dress     Pull over shirt/dress - Perfomed by patient: Thread/unthread right sleeve, Thread/unthread left sleeve, Put head through opening, Pull shirt over trunk          Upper body assist Assist Level: Supervision or verbal cues      Lower Body Dressing/Undressing Lower body dressing   What is the patient wearing?: Underwear, Pants, Socks, Shoes Underwear - Performed by patient: Thread/unthread right underwear leg, Thread/unthread left underwear leg, Pull underwear up/down   Pants- Performed by patient: Thread/unthread right pants leg, Thread/unthread left pants leg, Pull pants up/down   Non-skid slipper socks- Performed by patient: Don/doff right sock, Don/doff left sock   Socks - Performed by patient: Don/doff right sock, Don/doff left sock   Shoes - Performed by patient: Don/doff left shoe, Fasten left, Don/doff right shoe, Fasten right Shoes - Performed by helper: Don/doff right shoe, Fasten right  Lower body assist Assist for lower body dressing: Supervision or verbal cues      Toileting Toileting Toileting activity did not occur: No continent bowel/bladder event Toileting steps completed by patient: Adjust clothing prior to toileting, Performs perineal hygiene, Adjust clothing after toileting Toileting steps completed by helper: Adjust clothing prior to  toileting, Performs perineal hygiene, Adjust clothing after toileting(per Candice, NT report) Toileting Assistive Devices: Grab bar or rail  Toileting assist Assist level: Supervision or verbal cues   Transfers Chair/bed transfer   Chair/bed transfer method: Ambulatory Chair/bed transfer assist level: Touching or steadying assistance (Pt > 75%) Chair/bed transfer assistive device: Walker(rollator)     Locomotion Ambulation     Max distance: 150 ft  Assist level: Supervision or verbal cues   Wheelchair Wheelchair activity did not occur: N/A        Cognition Comprehension Comprehension assist level: Follows basic conversation/direction with no assist  Expression Expression assist level: Expresses basic needs/ideas: With extra time/assistive device  Social Interaction Social Interaction assist level: Interacts appropriately 90% of the time - Needs monitoring or encouragement for participation or interaction.  Problem Solving Problem solving assist level: Solves basic 25 - 49% of the time - needs direction more than half the time to initiate, plan or complete simple activities, Solves basic 50 - 74% of the time/requires cueing 25 - 49% of the time  Memory Memory assist level: Recognizes or recalls 75 - 89% of the time/requires cueing 10 - 24% of the time, Recognizes or recalls 50 - 74% of the time/requires cueing 25 - 49% of the time    Medical Problem List and Plan:  1. Functional and mobility deficits secondary to right acute/subacute parietal-occipital lobe infarct on 5/29-Poor attention to Left side  ASA and Clopidigrel  Continue CIR PT, OT, SLP,tent d/c 6/8                                               2. DVT Prophylaxis/Anticoagulation: Pharmaceutical: Lovenox plt slighly elevated 492K 3. 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   Resumed OxyContin at 10 mg bid and monitor for tolerance.   Continue hydrocodone prn Took 3-4 tabs per day at Home- currently on 7.5mg  q6h prn 4. Mood: LCSW to follow for evaluation and support.  5. Neuropsych: This patient is capable of making decisions on her own behalf.  6. Skin/Wound Care: routine pressure relief measures.  7. Fluids/Electrolytes/Nutrition: Monitor I/O. 630ml recorded yest, BUN 5, may d/c IV   D3 thins, advance diet as tolerated 8. COPD: Last exacerbation 12/2017. Continue Dulera bid, Singulair and spiriva  9. HTN: Monitor BP bid. 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   On Norvasc 2.5 PTA, restarted on 6/2- increase to 5mg  on 6/3 will increase to 10mg    Hydralazine when necessary   Vitals:   03/30/18 0500 03/30/18 0753  BP: (!) 142/72 (!) 142/92  Pulse:    Resp:    Temp:    SpO2:     10. 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 Tobacco abuse: Continue to encourage cessation. Added nicotine patch.  12. H/o anxiety disorder: On Remeron and Zoloft daily.on Klonopin while in [facility]opital discussed we'll not give Rx for home  13. Advanced OA right wrist:  Continue splint with local measures.  15. Hypokalemia- improved post supplementation- recheck 6/2- normal 3.7- will repeat in am  Potassium 2.6 on 6/1, supplemented with IV therapy on 6/1 and daily supplementation started on 6/1   16. Hypomagnesemia  Magnesium 1.6 and 6/1  Supplement initiated on 6/2- recheck 6/5- normal 2.2 17.  Insomnia- on ambien while in hospital only LOS (Days) 6 A FACE TO FACE EVALUATION WAS PERFORMED  Erick Colace 03/30/2018 8:13 AM

## 2018-03-30 NOTE — Progress Notes (Signed)
Social Work   Allyana Vogan, Eliezer Champagne  Social Worker  Physical Medicine and Rehabilitation  Patient Care Conference  Signed  Date of Service:  03/29/2018 12:59 PM          Signed          Show:Clear all [x]Manual[x]Template[]Copied  Added by: [x]Aanchal Cope, Gardiner Rhyme, LCSW   []Hover for details   Inpatient RehabilitationTeam Conference and Plan of Care Update Date: 03/29/2018   Time: 10:15 AM      Patient Name: Pam Johnson      Medical Record Number: [id number]  Date of Birth: [birth year] Sex: Female         Room/Bed: 4W20C/4W20C-01 Payor Info: Payor: MEDICARE / Plan: MEDICARE PART A AND B / Product Type: *No Product type* /     Admitting Diagnosis: CVA  Admit Date/Time:  03/24/2018  3:46 PM Admission Comments: No comment available    Primary Diagnosis:  <principal problem not specified> Principal Problem: <principal problem not specified>       Patient Active Problem List    Diagnosis Date Noted  . Hypokalemia    . Hypomagnesemia    . Dysphagia, post-stroke    . Hypertensive urgency    . Acute blood loss anemia    . Chronic obstructive pulmonary disease ([place])    . Chronic pain syndrome    . Acute ischemic right MCA stroke ([place]) 03/24/2018  . 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([place])    . Essential hypertension    . Stroke (cerebrum) ([place]) 03/22/2018  . 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 Pneumonia 10/18/2015      Expected Discharge Date: Expected Discharge Date: 04/01/18   Team Members Present: Physician leading conference: Dr. Alysia Penna Social Worker Present: Ovidio Kin, LCSW Nurse Present: Other (comment)(Latoya Foote-RN) PT Present: Lavone Nian, PT OT Present: Napoleon Form, OT SLP Present: Charolett Bumpers, SLP PPS Coordinator present : Daiva Nakayama, RN, CRRN       Current Status/Progress Goal Weekly Team Focus  Medical     Chronic anxiety, chronic pain.  Taking less pain medicine then at home.,  Blood pressure is labile   Maintain medical stability, adequate pain relief without oversedation  Blood pressure management, anxiety management   Bowel/Bladder          Cont B & B     Swallow/Nutrition/ Hydration      dys 2 and thin , due to pain with dentures  Mod I - upgraded and goal met no swallowing issue  education on wearing and not wearing dentures and appropriate textures   ADL's     Min A overall with cuing for safety; L field cut impacting safety with ADLs and mobility  Supervision overall  ADL re-training, neuro re-ed, visual compensatory techniques; family ed; d/c planning   Mobility     steady<>min assist overall, decreased endurance 2/2 SOB, L inattention/visual deficits, impulsive  supervision overall with LRAD  L attention/compensatory strategies for vision, balance, transfers, gait, NMR, coordination, stair negotiation, endurance training & strengthening   Communication               Safety/Cognition/ Behavioral Observations   Max- Mod A - greater diffculity functioonally then verbally in problem solving   mod A - downgraded 6/5  selective attention / internal distratcions/anexity, mildly compelx problem solving, memory, emergent awareness    Pain          less than 3-takes tylenol and MD limiting narcotics-goes to chronic pain clinic  Skin          monitor skin no issues at this time       *See Care Plan and progress notes for long and short-term goals.      Barriers to Discharge   Current Status/Progress Possible Resolutions Date Resolved   Physician     Medical stability     Progressing towards goals  Continue rehabilitation program, medication management      Nursing                 PT  Other (comments);Behavior  visual impairments, pt slightly impulsive              OT                 SLP            SW              Discharge Planning/Teaching Needs:  HOme with husband who can provide light min due to his own health issues. He has been here daily and observed in therapies.        Team Discussion:  Goals supervision level has decreased endurance and her anxiety distracts her. L-field cut and MD dealing with her chronic pain issues and pain meds. Weaned off O2. Husband was here yesterday for therapies.Pt making progress in therapies. Impulsive at times.  Revisions to Treatment Plan:  DC 6/8    Continued Need for Acute Rehabilitation Level of Care: The patient requires daily medical management by a physician with specialized training in physical medicine and rehabilitation for the following conditions: Daily direction of a multidisciplinary physical rehabilitation program to ensure safe treatment while eliciting the highest outcome that is of practical value to the patient.: Yes Daily medical management of patient stability for increased activity during participation in an intensive rehabilitation regime.: Yes Daily analysis of laboratory values and/or radiology reports with any subsequent need for medication adjustment of medical intervention for : Neurological problems   Elease Hashimoto 03/30/2018, 10:14 AM                 Patient ID: Pam Johnson, female   DOB: 02-25-1951, 67 y.o.   MRN: [id number]

## 2018-03-30 NOTE — Progress Notes (Signed)
Speech Language Pathology Daily Session Note  Patient Details  Name: Pam RogerConstance M Johnson MRN: [id number] Date of Birth: 12/25/1950  Today's Date: 03/30/2018 SLP Individual Time: 0830-0930 SLP Individual Time Calculation (min): 60 min  Short Term Goals: Week 1: SLP Short Term Goal 1 (Week 1): Pt will consume dys 3 trials with efficient mastication and no overt s/s aspiration to demonstrate readiness of diet advancement, SLP Short Term Goal 1 - Progress (Week 1): Discontinued (comment)(least restrictive diet without dentures and pt preference ) SLP Short Term Goal 2 (Week 1): Pt will demonstrate sustained attention during functional problem solving tasks with Mod A verbal cues. SLP Short Term Goal 2 - Progress (Week 1): Revised due to lack of progress SLP Short Term Goal 3 (Week 1): Pt will demonstrate functional problem solving for basic and familar (mildly complex) tasks with Mod A verbal cues. SLP Short Term Goal 3 - Progress (Week 1): Revised due to lack of progress SLP Short Term Goal 4 (Week 1): Pt will demonstarte compensatory strategies for visual deficits during functional problem solving tasks with Mod A verbal cues., SLP Short Term Goal 4 - Progress (Week 1): Revised due to lack of progress SLP Short Term Goal 5 (Week 1): Pt will self-monitor and correct functional errors with Mod A verbal cues.  SLP Short Term Goal 5 - Progress (Week 1): Revised due to lack of progress  Skilled Therapeutic Interventions: Skilled treatment session focused on cognition goals. SLP facilitated session by providing Max A multimodal cues to complete basic medication management tasks. Pt was able to maintain attention tot ask with no internal distractions however, she was not able to organize task, start on left consistently and to review progress with accuracy. Pt was returned to room, left upright in bed with all needs within reach. Continue per current plan of care.      Function:  Eating Eating                 Cognition Comprehension Comprehension assist level: Follows basic conversation/direction with no assist  Expression   Expression assist level: Expresses basic needs/ideas: With extra time/assistive device  Social Interaction Social Interaction assist level: Interacts appropriately 90% of the time - Needs monitoring or encouragement for participation or interaction.  Problem Solving Problem solving assist level: Solves basic 25 - 49% of the time - needs direction more than half the time to initiate, plan or complete simple activities;Solves basic 50 - 74% of the time/requires cueing 25 - 49% of the time  Memory Memory assist level: Recognizes or recalls 75 - 89% of the time/requires cueing 10 - 24% of the time;Recognizes or recalls 50 - 74% of the time/requires cueing 25 - 49% of the time    Pain Pain Assessment Pain Score: 6   Therapy/Group: Individual Therapy  Onia Shiflett 03/30/2018, 11:12 AM

## 2018-03-31 ENCOUNTER — Inpatient Hospital Stay (HOSPITAL_COMMUNITY): Payer: Medicare Other | Admitting: Physical Therapy

## 2018-03-31 ENCOUNTER — Inpatient Hospital Stay (HOSPITAL_COMMUNITY): Payer: Self-pay | Admitting: Occupational Therapy

## 2018-03-31 ENCOUNTER — Inpatient Hospital Stay (HOSPITAL_COMMUNITY): Payer: Self-pay | Admitting: Speech Pathology

## 2018-03-31 ENCOUNTER — Inpatient Hospital Stay (HOSPITAL_COMMUNITY): Payer: Self-pay | Admitting: Physical Therapy

## 2018-03-31 LAB — BASIC METABOLIC PANEL
Anion gap: 9 (ref 5–15)
BUN: 7 mg/dL (ref 6–20)
CHLORIDE: 101 mmol/L (ref 101–111)
CO2: 25 mmol/L (ref 22–32)
CREATININE: 0.58 mg/dL (ref 0.44–1.00)
Calcium: 9.1 mg/dL (ref 8.9–10.3)
GFR calc Af Amer: >60 mL/min (ref >60)
GFR calc non Af Amer: >60 mL/min (ref >60)
Glucose, Bld: 98 mg/dL (ref 65–99)
Potassium: 4.3 mmol/L (ref 3.5–5.1)
Sodium: 135 mmol/L (ref 135–145)

## 2018-03-31 MED ORDER — NICOTINE 14 MG/24HR TD PT24
14.0000 mg | MEDICATED_PATCH | Freq: Every day | TRANSDERMAL | 0 refills | Status: DC
Start: 2018-04-01 — End: 2018-07-18

## 2018-03-31 MED ORDER — ASPIRIN 81 MG PO TBEC: 81.0000 mg | DELAYED_RELEASE_TABLET | Freq: Every day | ORAL | Status: DC

## 2018-03-31 MED ORDER — MUSCLE RUB 10-15 % EX CREA: 1.0000 "application " | TOPICAL_CREAM | Freq: Four times a day (QID) | CUTANEOUS | 0 refills | Status: DC

## 2018-03-31 MED ORDER — OXYCODONE HCL ER 10 MG PO T12A: 10.0000 mg | EXTENDED_RELEASE_TABLET | Freq: Two times a day (BID) | ORAL | 0 refills | Status: DC

## 2018-03-31 MED ORDER — AMLODIPINE BESYLATE 10 MG PO TABS
10.0000 mg | ORAL_TABLET | Freq: Every day | ORAL | 0 refills | Status: DC
Start: 2018-04-01 — End: 2018-07-18

## 2018-03-31 MED ORDER — PANTOPRAZOLE SODIUM 40 MG PO TBEC: 40.0000 mg | DELAYED_RELEASE_TABLET | Freq: Every day | ORAL | 0 refills | Status: DC

## 2018-03-31 MED ORDER — HYDROCODONE-ACETAMINOPHEN 7.5-325 MG PO TABS: 1.0000 | ORAL_TABLET | Freq: Two times a day (BID) | ORAL | 0 refills | Status: DC | PRN

## 2018-03-31 MED ORDER — MIRTAZAPINE 7.5 MG PO TABS: 7.5000 mg | ORAL_TABLET | Freq: Every day | ORAL | 0 refills | Status: DC

## 2018-03-31 MED ORDER — ACETAMINOPHEN 325 MG PO TABS: 325.0000 mg | ORAL_TABLET | Freq: Four times a day (QID) | ORAL | Status: DC | PRN

## 2018-03-31 MED ORDER — DOCUSATE SODIUM 100 MG PO CAPS: 100.0000 mg | ORAL_CAPSULE | Freq: Two times a day (BID) | ORAL | 0 refills | Status: DC

## 2018-03-31 MED ORDER — CLOPIDOGREL BISULFATE 75 MG PO TABS: 75.0000 mg | ORAL_TABLET | Freq: Every day | ORAL | 0 refills | Status: DC

## 2018-03-31 NOTE — Progress Notes (Signed)
Physical Therapy Discharge Summary  Patient Details  Name: Pam Johnson MRN: [id number] Date of Birth: 1951-10-20  Today's Date: 03/31/2018 PT Individual Time: 1107-1200 and 2505-3976 PT Individual Time Calculation (min): 53 min and 24 min   Patient has met 5 of 7 long term goals due to improved activity tolerance, improved balance, improved postural control, increased strength, ability to compensate for deficits, functional use of  right upper extremity and right lower extremity, improved attention, improved awareness and improved coordination.  Patient to discharge at an ambulatory level supervision with rollator.   Patient's care partner is independent to provide the necessary physical and cognitive assistance at discharge.  Reasons goals not met: impaired balance & cognition  Recommendation:  Patient will benefit from ongoing skilled PT services in home health setting to continue to advance safe functional mobility, address ongoing impairments in decreased balance, L inattention, decreased endurance, and minimize fall risk.   Equipment: rollator  Reasons for discharge: treatment goals met  Patient/family agrees with progress made and goals achieved: Yes  Skilled PT Treatment: Treatment 1: Pt received in bed & agreeable to tx. Pt notes 8/10 back pain but premedicated. Pt performed all grad day activities with supervision overall and use of rollator, please see below for details. Manual muscle testing & sensation testing completed as well, see below. Pt negotiates 24 steps with B rails with supervision with cuing to attend to LUE as pt leaving it on rail behind her with absent awareness. Pt negotiates curb with rollator, verbalizing how to complete task, but little ability to execute it thus requiring cuing for safety and management of rollator but supervision overall. Pt completes floor transfer with supervision with therapist educating her on instances when she should call EMS after a  fall. Pt required assistance to path find back to room. Pt left sitting in recliner with chair alarm on, set up with meal tray, and needs within reach.   Treatment 2: Pt received in room with need to use restroom. Pt ambulated within room & bathroom with RW & supervision. Pt with continent void and performed toilet transfer and peri hygiene with supervision. Pt ambulates room<>dayroom with Rollator and supervision. Pt requires frequent cuing in small spaces to attend to L side of rollator as pt hitting it on objects but decreased awareness to attend and modify activity. Pt utilized cybex kinetron in sitting progressing to standing with BUE support with task focusing on strengthening, NMR, balance, weight shifting, and endurance training with max cuing to pace herself. Pt required seated rest breaks between each 30 second bout. Pt reports no concerns regarding d/c home but therapist continues educating her on need to slow down, focus on increased safety awareness, and to reduce impulsivity and need for husband to provide supervision for all mobility with pt voicing understanding. Pt left in bed with alarm set & needs within reach.  PT Discharge Precautions/Restrictions Precautions Precautions: Fall Precaution Comments: L field cut Restrictions Weight Bearing Restrictions: No  Vision/Perception  Pt wears glasses at all times at baseline. Pt reports blurry vision following medical event.  Perception Perception: Impaired Inattention/Neglect: Decreased attention to left visual field, decreased attention to L side of body  Cognition Overall Cognitive Status: Impaired/Different from baseline Arousal/Alertness: Awake/alert Orientation Level: Oriented X4 Memory: Impaired Memory Impairment: Decreased recall of new information;Decreased short term memory Awareness: Impaired Awareness Impairment: Anticipatory impairment Problem Solving: Impaired Problem Solving Impairment: Functional basic Behaviors:  Impulsive(pt very anxious at baseline) Safety/Judgment: Impaired   Sensation Sensation Light Touch:  Impaired by gross assessment(decreased sensation LLE compared to RLE; pt reports 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 that is worse in BLE since medical event) Proprioception: Appears Intact(BLE) Coordination Gross Motor Movements are Fluid and Coordinated: Yes  Motor  Motor Motor: Other (comment);Hemiplegia Motor - Discharge Observations: Mild Lt hemi, generalized weakness    Mobility Bed Mobility Bed Mobility: Rolling Left;Supine to Sit;Sit to Supine(regular bed in apartment) Rolling Left: Independent Supine to Sit: Independent Sit to Supine: Independent Transfers Transfers: Sit to Stand Sit to Stand: Supervision/Verbal cueing  Locomotion  Gait Ambulation: Yes Gait Assistance: Supervision/Verbal cueing Ambulation Distance (Feet): 150 Feet Assistive device: (rollator) Ambulation/Gait Assistance Details: occasional cuing to attend to L visual field Gait Gait: Yes Gait Pattern: (decreased step length, decreased gait speed, decreased heel strike BLE, decreased foot clearance BLE) Stairs / Additional Locomotion Stairs: Yes Stairs Assistance: Supervision/Verbal cueing Stair Management Technique: Two rails Number of Stairs: 24 Height of Stairs: (6" + 3") Ramp: Supervision/Verbal cueing(ambulatory with rollator) Curb: Supervision/Verbal cueing(max cuing for safety & rollator management.) Wheelchair Mobility Wheelchair Mobility: No   Trunk/Postural Assessment  Cervical Assessment Cervical Assessment: Exceptions to WFL(Forward head) Thoracic Assessment Thoracic Assessment: Exceptions to WFL(Kyphotic) Lumbar Assessment Lumbar Assessment: Exceptions to WFL(Posterior pelvic) Postural Control Postural Control: Within Functional Limits   Balance Balance Balance Assessed: Yes Standardized Balance Assessment Standardized Balance Assessment: Berg Balance Test Berg Balance  Test Sit to Stand: Able to stand without using hands and stabilize independently Standing Unsupported: Able to stand 2 minutes with supervision Sitting with Back Unsupported but Feet Supported on Floor or Stool: Able to sit safely and securely 2 minutes Stand to Sit: Sits safely with minimal use of hands Transfers: Able to transfer with verbal cueing and /or supervision(max cuing 2/2 L visual deficits) Standing Unsupported with Eyes Closed: Able to stand 10 seconds with supervision Standing Ubsupported with Feet Together: Able to place feet together independently and stand for 1 minute with supervision From Standing, Reach Forward with Outstretched Arm: Can reach forward >12 cm safely (5") From Standing Position, Pick up Object from Floor: Able to pick up shoe, needs supervision From Standing Position, Turn to Look Behind Over each Shoulder: Looks behind from both sides and weight shifts well Turn 360 Degrees: Needs close supervision or verbal cueing Standing Unsupported, Alternately Place Feet on Step/Stool: Able to complete 4 steps without aid or supervision(completes 8 steps but with close supervision) Standing Unsupported, One Foot in Front: Needs help to step but can hold 15 seconds Standing on One Leg: Tries to lift leg/unable to hold 3 seconds but remains standing independently Total Score: 38   Extremity Assessment  All extremities WFL.  RLE/LLE equal. (3+/5 hip flexors, 4-/5 knee extension/flexion, 3+/5 ankle dorsiflexion)  See Function Navigator for Current Functional Status.  Waunita Schooner 03/31/2018, 4:05 PM

## 2018-03-31 NOTE — Progress Notes (Signed)
[facility] PHYSICAL MEDICINE & REHABILITATION     PROGRESS NOTE  Subjective/Complaints:   Feels nauseated after drinking coffee (no a coffee drinker), no abd pain or vomiting, has not taken AM meds yet  ROS: denies CP, SOB, nausea, vomiting, diarrhea.  Objective: Vital Signs: Blood pressure (!) 175/84, pulse 86, temperature 98.6 F (37 C), temperature source Oral, resp. rate 18, height 5\' 4"  (1.626 m), weight 51 kg (112 lb 7 oz), SpO2 94 %. No results found. Recent Labs    03/29/18 0500  WBC 6.4  HGB 12.2  HCT 37.8  PLT 492*   No results for input(s): NA, K, CL, GLUCOSE, BUN, CREATININE, CALCIUM in the last 72 hours.  Invalid input(s): CO CBG (last 3)  No results for input(s): GLUCAP in the last 72 hours.  Wt Readings from Last 3 Encounters:  03/29/18 51 kg (112 lb 7 oz)  03/22/18 51.4 kg (113 lb 5.1 oz)  01/27/16 51.3 kg (113 lb)    Physical Exam:  BP (!) 175/84 (BP Location: Right Arm)   Pulse 86   Temp 98.6 F (37 C) (Oral)   Resp 18   Ht 5\' 4"  (1.626 m)   Wt 51 kg (112 lb 7 oz)   SpO2 94%   BMI 19.30 kg/m  Constitutional: No distress. Nasal cannula in place. Frail appearing  HENT: Ecchymotic areas on left forehead (with induration and scabbing) and bilateral cheeks.  Eyes: EOMI. No discharge. Cardiovascular: RRR. No JVD. Respiratory: unlabored. Barrel chested. Intermittent wheezing.  Neurological: alert and oriented. + [place] Fair insight and awareness.  Motor: LUE 4+/5 prox to distal.  LLE 4/5 proximally, 4+/5 distally RUE/RLE 5/5 (stable).  Skin: She is not diaphoretic. BUE with multiple healed skin tears and ecchymotic areas. Bilateral shins with multiple petechial areas (due to trauma)  Psychiatric: She has a normal mood and affect. Her behavior is normal.   Assessment/Plan: 1. Functional deficits secondary to right acute/subacute parietal-occipital lobe infarct which require 3+ hours per day of interdisciplinary therapy in a comprehensive inpatient  rehab setting. Physiatrist is providing close team supervision and 24 hour management of active medical problems listed below. Physiatrist and rehab team continue to assess barriers to discharge/monitor patient progress toward functional and medical goals.  Function:  Bathing Bathing position Bathing activity did not occur: Refused Position: Systems developerhower  Bathing parts Body parts bathed by patient: Right arm, Left arm, Chest, Abdomen, Front perineal area, Buttocks, Right upper leg, Left upper leg, Right lower leg, Left lower leg, Back Body parts bathed by helper: Back  Bathing assist Assist Level: Supervision or verbal cues      Upper Body Dressing/Undressing Upper body dressing   What is the patient wearing?: Pull over shirt/dress     Pull over shirt/dress - Perfomed by patient: Thread/unthread right sleeve, Thread/unthread left sleeve, Put head through opening, Pull shirt over trunk          Upper body assist Assist Level: Supervision or verbal cues      Lower Body Dressing/Undressing Lower body dressing   What is the patient wearing?: Underwear, Pants, Socks, Shoes Underwear - Performed by patient: Thread/unthread right underwear leg, Thread/unthread left underwear leg, Pull underwear up/down   Pants- Performed by patient: Thread/unthread right pants leg, Thread/unthread left pants leg, Pull pants up/down   Non-skid slipper socks- Performed by patient: Don/doff right sock, Don/doff left sock   Socks - Performed by patient: Don/doff right sock, Don/doff left sock   Shoes - Performed by patient: Don/doff  left shoe, Fasten left, Don/doff right shoe, Fasten right Shoes - Performed by helper: Don/doff right shoe, Fasten right          Lower body assist Assist for lower body dressing: Supervision or verbal cues      Toileting Toileting Toileting activity did not occur: No continent bowel/bladder event Toileting steps completed by patient: Adjust clothing prior to toileting,  Performs perineal hygiene, Adjust clothing after toileting Toileting steps completed by helper: Adjust clothing prior to toileting, Performs perineal hygiene, Adjust clothing after toileting(per Candice, NT report) Toileting Assistive Devices: Grab bar or rail  Toileting assist Assist level: Supervision or verbal cues   Transfers Chair/bed transfer   Chair/bed transfer method: Ambulatory Chair/bed transfer assist level: Supervision or verbal cues Chair/bed transfer assistive device: Armrests, Walker(rollator)     Locomotion Ambulation     Max distance: 200' Assist level: Supervision or verbal cues   Wheelchair Wheelchair activity did not occur: N/A        Cognition Comprehension Comprehension assist level: Follows basic conversation/direction with no assist  Expression Expression assist level: Expresses basic needs/ideas: With extra time/assistive device  Social Interaction Social Interaction assist level: Interacts appropriately 90% of the time - Needs monitoring or encouragement for participation or interaction.  Problem Solving Problem solving assist level: Solves basic 25 - 49% of the time - needs direction more than half the time to initiate, plan or complete simple activities, Solves basic 50 - 74% of the time/requires cueing 25 - 49% of the time  Memory Memory assist level: Recognizes or recalls 75 - 89% of the time/requires cueing 10 - 24% of the time, Recognizes or recalls 50 - 74% of the time/requires cueing 25 - 49% of the time    Medical Problem List and Plan:  1. Functional and mobility deficits secondary to right acute/subacute parietal-occipital lobe infarct on 5/29-Poor attention to Left side  ASA and Clopidigrel  Continue CIR PT, OT, SLP,tent d/c 6/8                                               2. DVT Prophylaxis/Anticoagulation: Pharmaceutical: Lovenox plt slighly elevated 492K 3. Chronic LBP pain/ severe hip OA with chronic AVN /Pain Management: Was on Oxycontin  20 mg tid/ Lyrica tid with hydrocodone qid prn.   Resumed OxyContin at 10 mg bid and monitor for tolerance.  Continue hydrocodone prn Took 3-4 tabs per day at Home- currently on 7.5mg  q6h prn 4. Mood: LCSW to follow for evaluation and support.  5. Neuropsych: This patient is capable of making decisions on her own behalf.  6. Skin/Wound Care: routine pressure relief measures.  7. Fluids/Electrolytes/Nutrition: Monitor I/O. recorded yest, BUN 5, may d/c IV   D3 thins, advance diet as tolerated 8. COPD: Last exacerbation 12/2017. Continue Dulera bid, Singulair and spiriva  9. HTN: Monitor BP bid. 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   On Norvasc 2.5 PTA, restarted on 6/2- increase to 5mg  on 6/3 will increase to 10mg  on 6/6 , PCP f/u as OP in 1-2 wks   Hydralazine when necessary   Vitals:   03/30/18 1954 03/31/18 0447  BP: 135/69 (!) 175/84  Pulse: 85 86  Resp: 18 18  Temp: 98.4 F (36.9 C) 98.6 F (37 C)  SpO2: 92% 94%   10. HH with GERD: On Reglan  qid  11. Tobacco abuse: Continue to encourage cessation. Added nicotine patch.  12. H/o anxiety disorder: On Remeron and Zoloft daily.on Klonopin while in hospital discussed we'll not give Rx for home  13. Advanced OA right wrist: Continue splint with local measures.  15. Hypokalemia- improved post supplementation- recheck 6/2- normal 3.7- will repeat today, review prior to D/C  Potassium 2.6 on 6/1, supplemented with IV therapy on 6/1 and daily supplementation started on 6/1   16. Hypomagnesemia  Magnesium 1.6 and 6/1  Supplement initiated on 6/2- recheck 6/5- normal 2.2 17.  Insomnia- on ambien while in hospital only LOS (Days) 7 A FACE TO FACE EVALUATION WAS PERFORMED  Erick Colace 03/31/2018 8:01 AM

## 2018-03-31 NOTE — Progress Notes (Signed)
Social Work  Discharge Note  The overall goal for the admission was met for: DC-SAT 6/8  Discharge location: Yes-HOME WITH HUSBAND AND SON TAKING FMLA TO ASSIST  Length of Stay: Yes-8 DAYS  Discharge activity level: Yes-SUPERVISION WITH CUEING  Home/community participation: Yes  Services provided included: MD, RD, PT, OT, SLP, RN, CM, TR, Pharmacy, Neuropsych and SW  Financial Services: Medicare  Follow-up services arranged: Home Health: [facility]-PT,OT,SP, DME: [place] and Patient/Family request agency HH: PREF [facility] USED THEM BEFORE, DME: NO PREF  Comments (or additional information):HUSBAND AND SON TO ASSIST PT SHE REACHED SUPERVISION LEVEL, ANXIETY LIMITS HER IN HER ABILITIES. IS AWARE EQUIPMENT IS PRIVATE PAY DUE TO HAS A RW IN THE PAST FIVE YEARS  Patient/Family verbalized understanding of follow-up arrangements: Yes  Individual responsible for coordination of the follow-up plan: SELF & HUEL-HUSBAND  Confirmed correct DME delivered: Elease Hashimoto 03/31/2018    Elease Hashimoto

## 2018-03-31 NOTE — Progress Notes (Signed)
Occupational Therapy Discharge Summary  Patient Details  Name: Pam Johnson MRN: [id number] Date of Birth: 1951/05/09   Patient has met 9 of 9 long term goals due to improved activity tolerance, improved balance, postural control, ability to compensate for deficits, functional use of  LEFT upper extremity, improved attention, improved awareness and improved coordination.  Patient to discharge at overall Supervision level.  Patient's care partner is independent to provide the necessary physical and cognitive assistance at discharge.  Pt's husband to provide 24 hr supervision assist at d/c. He has completed family ed. Session with PT, however, not OT. He is aware of use and recommendation for tub transfer bench for use at home.  Pt cont to to be most limited by L field cut and high level of anxiety, requiring VCs for redirection and safety. Pt using rollator for functional mobility and requires VCs for safety awareness and proper management of rollator.   Recommendation:  Patient will benefit from ongoing skilled OT services in home health setting to continue to advance functional skills in the area of BADL and Reduce care partner burden.  Equipment: Tub transfer bench and rollator  Reasons for discharge: treatment goals met and discharge from hospital  Patient/family agrees with progress made and goals achieved: Yes  OT Discharge Precautions/Restrictions  Precautions Precautions: Fall Precaution Comments: L field cut Restrictions Weight Bearing Restrictions: No ADL ADL ADL Comments: Please see functional navigator for ADL status Vision Baseline Vision/History: Wears glasses Wears Glasses: At all times Patient Visual Report: Eye fatigue/eye pain/headache;Blurring of vision Vision Assessment?: Yes Eye Alignment: Within Functional Limits Perception  Inattention/Neglect: Does not attend to left visual field Praxis Praxis: Intact Cognition Overall Cognitive Status:  Impaired/Different from baseline Arousal/Alertness: Awake/alert Orientation Level: Oriented X4 Selective Attention: Impaired Divided Attention: Impaired Divided Attention Impairment: Verbal basic;Functional basic Memory: Appears intact Awareness: Appears intact Problem Solving: Impaired Problem Solving Impairment: Functional complex;Verbal complex Safety/Judgment: Appears intact Sensation Sensation Light Touch: Appears Intact Coordination Gross Motor Movements are Fluid and Coordinated: Yes Fine Motor Movements are Fluid and Coordinated: Yes Motor  Motor Motor: Other (comment);Hemiplegia Motor - Discharge Observations: Mild Lt hemi, generalized weakness  Mobility     Trunk/Postural Assessment  Cervical Assessment Cervical Assessment: Exceptions to WFL(Forward head) Thoracic Assessment Thoracic Assessment: Exceptions to WFL(Kyphotic) Lumbar Assessment Lumbar Assessment: Exceptions to WFL(Posterior pelvic) Postural Control Postural Control: Within Functional Limits  Balance Balance Balance Assessed: Yes Dynamic Sitting Balance Dynamic Sitting - Balance Support: No upper extremity supported;During functional activity Dynamic Sitting - Level of Assistance: 6: Modified independent (Device/Increase time) Static Standing Balance Static Standing - Balance Support: During functional activity;No upper extremity supported Static Standing - Level of Assistance: 5: Stand by assistance;6: Modified independent (Device/Increase time) Dynamic Standing Balance Dynamic Standing - Balance Support: No upper extremity supported;During functional activity Dynamic Standing - Level of Assistance: 5: Stand by assistance Extremity/Trunk Assessment RUE Assessment RUE Assessment: Within Functional Limits LUE Assessment LUE Assessment: Within Functional Limits General Strength Comments: 4+/5   See Function Navigator for Current Functional Status.  Chyler Creely L 03/31/2018, 7:27 AM

## 2018-03-31 NOTE — Discharge Instructions (Signed)
Inpatient Rehab Discharge Instructions  Pam Johnson Discharge date and time: 04/01/18   Activities/Precautions/ Functional Status: Activity: no lifting, driving, or strenuous exercise  till cleared by MD Diet: cardiac diet soft foods Wound Care: none needed   Functional status:  ___ No restrictions     ___ Walk up steps independently _X__ 24/7 supervision/assistance   ___ Walk up steps with assistance ___ Intermittent supervision/assistance  ___ Bathe/dress independently ___ Walk with walker     ___ Bathe/dress with assistance ___ Walk Independently    ___ Shower independently ___ Walk with assistance    _X__ Shower with supervision.  _X__ No alcohol     ___ Return to work/school ________    Special Instructions: 1. Family needs to assist patient with medication management. Note changes in pain medication regimen. Dr. Bernita Buffy office will contact you for follow up appointment.  2. Your anxiety has greatly improved and should get better with discharge to home. Discuss with MD if it continues to be an issue.  3. Focus on doing one thing at a time. Pace yourself so that you don't get distracted.    COMMUNITY REFERRALS UPON DISCHARGE:    Home Health:   PT, OT, SP  Agency:[facility] Phone:[phone]   Date of last service:04/01/2018  Medical Equipment/Items Ordered:ROLLATOR Levan Hurst & TUB BENCH  Agency/Supplier:[facility]   [phone]   GENERAL COMMUNITY RESOURCES FOR PATIENT/FAMILY: Support Groups:[facility] SUPPORT GROUP EVERY SECOND Thursday ( SEPT-MAY) AT 3:00-4:00 PM ON THE REHAB UNIT QUESTIONS CONTACT [place] [phone]  STROKE/TIA DISCHARGE INSTRUCTIONS SMOKING Cigarette smoking nearly doubles your risk of having a stroke & is the single most alterable risk factor  If you smoke or have smoked in the last 12 months, you are advised to quit smoking for your health.  Most of the excess cardiovascular risk related to smoking disappears within a  year of stopping.  Ask you doctor about anti-smoking medications  [place] Quit Line: 1-800-QUIT NOW  Free Smoking Cessation Classes [phone]  CHOLESTEROL Know your levels; limit fat & cholesterol in your diet  Lipid Panel     Component Value Date/Time   CHOL 132 03/23/2018 0420   TRIG 124 03/23/2018 0420   HDL 63 03/23/2018 0420   CHOLHDL 2.1 03/23/2018 0420   VLDL 25 03/23/2018 0420   LDLCALC 44 03/23/2018 0420      Many patients benefit from treatment even if their cholesterol is at goal.  Goal: Total Cholesterol (CHOL) less than 160  Goal:  Triglycerides (TRIG) less than 150  Goal:  HDL greater than 40  Goal:  LDL (LDLCALC) less than 100   BLOOD PRESSURE American Stroke Association blood pressure target is less that 120/80 mm/Hg  Your discharge blood pressure is:  BP: 132/64  Monitor your blood pressure  Limit your salt and alcohol intake  Many individuals will require more than one medication for high blood pressure  DIABETES (A1c is a blood sugar average for last 3 months) Goal HGBA1c is under 7% (HBGA1c is blood sugar average for last 3 months)  Diabetes: No known diagnosis of diabetes    Lab Results  Component Value Date   HGBA1C 5.6 03/23/2018     Your HGBA1c can be lowered with medications, healthy diet, and exercise.  Check your blood sugar as directed by your physician  Call your physician if you experience unexplained or low blood sugars.  PHYSICAL ACTIVITY/REHABILITATION Goal is 30 minutes at least 4 days per week  Activity: No  driving, Therapies: see above Return to work: N/A  Activity decreases your risk of heart attack and stroke and makes your heart stronger.  It helps control your weight and blood pressure; helps you relax and can improve your mood.  Participate in a regular exercise program.  Talk with your doctor about the best form of exercise for you (dancing, walking, swimming, cycling).  DIET/WEIGHT Goal is to maintain a healthy  weight  Your discharge diet is:  Diet Order           DIET DYS 2 Room service appropriate? Yes; Fluid consistency: Thin  Diet effective now          liquids Your height is:  Height: 5\' 4"  (162.6 cm) Your current weight is: Weight: 51 kg (112 lb 7 oz) Your Body Mass Index (BMI) is:  BMI (Calculated): 19.29  Following the type of diet specifically designed for you will help prevent another stroke.  You are at goal weight   Your goal Body Mass Index (BMI) is 19-24.  Healthy food habits can help reduce 3 risk factors for stroke:  High cholesterol, hypertension, and excess weight.  RESOURCES Stroke/Support Group:  Call [phone]   STROKE EDUCATION PROVIDED/REVIEWED AND GIVEN TO PATIENT Stroke warning signs and symptoms How to activate emergency medical system (call 911). Medications prescribed at discharge. Need for follow-up after discharge. Personal risk factors for stroke. Pneumonia vaccine given:  Flu vaccine given:  My questions have been answered, the writing is legible, and I understand these instructions.  I will adhere to these goals & educational materials that have been provided to me after my discharge from the hospital.     My questions have been answered and I understand these instructions. I will adhere to these goals and the provided educational materials after my discharge from the hospital.  Patient/Caregiver Signature _______________________________ Date __________  Clinician Signature _______________________________________ Date __________  Please bring this form and your medication list with you to all your follow-up doctor's appointments.

## 2018-03-31 NOTE — Discharge Summary (Addendum)
Physician Discharge Summary  Patient ID: Pam Johnson MRN: [id number] DOB/AGE: [birth year] 67 y.o.  Admit date: 03/24/2018 Discharge date: 04/01/2018  Discharge Diagnoses:  Principal Problem:   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[facility]) Active Problems:   Acute ischemic right MCA stroke (HCC)   Essential hypertension   Acute blood loss anemia   Chronic obstructive pulmonary disease (HCC)   Chronic pain syndrome   Dysphagia, post-stroke   Discharged Condition:  Stable   Significant Diagnostic Studies: Ct Angio Head W Or Wo Contrast  Result Date: 03/22/2018 CLINICAL DATA:  Right MCA syndrome EXAM: CT ANGIOGRAPHY HEAD AND NECK CT PERFUSION BRAIN TECHNIQUE: Multidetector CT imaging of the head and neck was performed using the standard protocol during bolus administration of intravenous contrast. Multiplanar CT image reconstructions and MIPs were obtained to evaluate the vascular anatomy. Carotid stenosis measurements (when applicable) are obtained utilizing NASCET criteria, using the distal internal carotid diameter as the denominator. Multiphase CT imaging of the brain was performed following IV bolus contrast injection. Subsequent parametric perfusion maps were calculated using RAPID software. CONTRAST:  Dose not currently available COMPARISON:  None. FINDINGS: CTA NECK FINDINGS Aortic arch: Extensive atherosclerotic plaque.  No dissection. Right carotid system: Bulky, irregular calcified plaque at the brachiocephalic origin. Prominent noncalcified plaque primarily on the anterior wall the common carotid with up to 50% stenosis. Bulky plaque at the bifurcation of the common carotid with ICA occlusion. The non-opacified vessel is difficult to distinguish and may be collapsed from chronic occlusion. Left carotid system: Diffuse atherosclerotic plaque. Proximal common carotid stenosis of 50% on coronal reformats. Bulky calcified plaque at the ICA bulb causes ~80% stenosis. Negative for ulceration. Vertebral  arteries: Extensive calcified plaque along the proximal left subclavian with nonvisualized lumen in places due to degree of stenosis and calcified plaque blooming. Beyond the vertebral origin there is additional bulky calcified plaque with at least 50% stenosis before and at the rib crossing (quantification is limited given the extent of disease which makes it difficult to determine baseline diameter). The left vertebral artery is occluded at the V2 segment. There is severe proximal right vertebral stenosis due to calcified plaque, with non visible lumen. Right proximal subclavian stenosis measures up to 50%. Skeleton: No acute finding. Severe cervical spine disc and facet degeneration with multilevel listhesis and kyphotic deformity. Other neck: No acute finding. Upper chest: Emphysema Review of the MIP images confirms the above findings CTA HEAD FINDINGS Anterior circulation: Right ICA occlusion to the level of the terminus where there is reconstitution presumably via the small P1 segment and a tiny anterior communicating artery. Right MCA branches are under filled but symmetrically opacified. Symmetric bilateral ACA flow. Atherosclerotic plaque on the left carotid siphon with mild-to-moderate narrowing. No left-sided branch occlusion is seen. Posterior circulation: Robust flow in the right vertebral artery. Presumed retrograde flow in the left vertebral artery. Both picas are enhancing. There is moderate narrowing and calcified plaque at the left V4 segment. Hypoplastic right P1 segment. Under filled right PCA branches. Venous sinuses: Not yet opacified Anatomic variants: Fetal type right PCA Delayed phase: Not obtained in the emergent setting Review of the MIP images confirms the above findings CT Brain Perfusion Findings: CBF (<30%) Volume: 33mL Perfusion (Tmax>6.0s) volume: Mismatch Volume: Infarction Location:The infarct by CT perfusion encompasses the low-density completed infarct by noncontrast  CT. It also includes areas along the upper watershed that were not clearly seen by CT. The penumbra calculation includes ACA territory. At time of head CT call report,  Dr Amada Jupiter was already aware of RICA occlusion. Communication of perfusion findings is underway. 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 Limited major collateral pathways-fetal type right PCA with hypoplastic right P1 segment and probable tiny anterior communicating artery. 33 cc infarct in the right MCA/PCA distribution, which includes the area of completed infarct by noncontrast CT and additional parenchyma along the watershed. Nearly the entire right cerebral hemisphere is in the penumbra range (336 cc). Severe underlying atherosclerotic disease: *Severe proximal left subclavian stenosis. 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 *Severe right vertebral artery origin stenosis. *~80% proximal left ICA stenosis. Nearly 50% proximal left common carotid stenosis. *~50% stenosis at the right common carotid origin. Emphysema. Electronically Signed   By: Marnee Spring M.D.   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 Cough and shortness of breath. Aspiration. EXAM: CHEST - 2 VIEW COMPARISON:  01/27/2016 chest radiographs and earlier. FINDINGS: Upright AP and lateral views of the chest at 1054 hours. Stable large lung volumes. No pneumothorax, pulmonary edema or consolidation. Small bilateral pleural effusions suspected. Chronic hiatal hernia suspected. Other mediastinal contours are within normal limits. Calcified aortic atherosclerosis. Visualized tracheal air column is within normal limits. Chronic lower thoracic augmented compression fracture. Partially visible right proximal humerus ORIF hardware. Stable  visualized osseous structures. Negative visible bowel gas pattern. IMPRESSION: 1. Probable small bilateral pleural effusions. 2. No other acute cardiopulmonary abnormality identified. 3. Chronic hiatal hernia suspected. 4.  Aortic Atherosclerosis (ICD10-I70.0). Electronically Signed   By: Odessa Fleming M.D.   On: 03/23/2018 10:52   Ct Head Wo Contrast  Addendum Date: 03/23/2018   ADDENDUM REPORT: 03/23/2018 13:12 ADDENDUM: Study discussed by telephone with Neurology Dr. Pearlean Brownie on 03/23/2018 at 1304 hours. He advises that clinically the patient is doing well, with improved deficits since presentation and no known new or right side deficits. This argues against acute ischemia in the left hemisphere. The patient was unable to tolerate Brain MRI earlier today. Electronically Signed   By: Odessa Fleming M.D.   On: 03/23/2018 13:12   Result Date: 03/23/2018 CLINICAL DATA:  68 year old female status post right ICA large vessel occlusion. Unsuccessful revascularization. Right anterior frontal lobe, right parietal and occipital lobe, and watershed areas of core infarct suspected at presentation. EXAM: CT HEAD WITHOUT CONTRAST TECHNIQUE: Contiguous axial images were obtained from the base of the skull through the vertex without intravenous contrast. COMPARISON:  CTA and CT perfusion plus presentation noncontrast head CT 03/22/2018. FINDINGS: Brain: Mildly enlarged and progressed areas of cytotoxic edema in the right parietal and occipital lobes since the presentation CT 1304 hours yesterday (series 3, image 19). Similar mild patchy increased cytotoxic edema related hypodensity in the right superior frontal lobe (images 22-24). No associated hemorrhage or mass effect. However, at the same time there is new confluent white matter hypodensity in the left corona radiata (series 3, image 18), tracking to the deep white matter capsules. The left M2 SIL a and basal ganglia appear to remain stable. Questionable new hypodensity at the lower  left thalamus (image 13). Elsewhere gray-white matter differentiation appears to remain stable. No intracranial mass effect. No intracranial hemorrhage or ventriculomegaly. Vascular: Calcified atherosclerosis at the skull base. New calcified density in the left sylvian fissure on series 3, image 13 suggesting interval left MCA branch plaque  thromboembolism. Skull: No acute osseous abnormality identified. Sinuses/Orbits: Visualized paranasal sinuses and mastoids are stable and generally well pneumatized. Other: No acute orbit or scalp soft tissue findings. IMPRESSION: 1. Comparatively mild evolution of right hemisphere cytotoxic edema since 1304 hours yesterday considering the CTA/CTP findings at presentation. The right hemisphere core infarct size has perhaps doubled since that time. 2. Questionable new white matter ischemia in the left hemisphere. And there is evidence of interval left MCA branch thromboembolism (series 3, image 13). 3. No intracranial hemorrhage or mass effect. Electronically Signed: By: Odessa Fleming M.D. On: 03/23/2018 13:01   Dg Hand 2 View Right  Result Date: 03/23/2018 CLINICAL DATA:  Acute onset of dorsal right hand bruising and pain. Initial encounter. EXAM: RIGHT HAND - 2 VIEW COMPARISON:  None. 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 Mild degenerative change is noted at the first carpometacarpal joint, and there is mild underlying osseous fragmentation. Diffuse soft tissue swelling is noted about the wrist and dorsum of the hand. Remaining visualized joint spaces are grossly unremarkable. There is no definite evidence of fracture. IMPRESSION: 1. 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 This is of uncertain etiology.  Would correlate with the patient's history. 2. Mild degenerative change at the first carpometacarpal joint, with mild underlying osseous fragmentation. 3. Diffuse soft tissue swelling about the wrist and dorsum of the hand. Electronically Signed   By: Roanna Raider M.D.   On: 03/23/2018 22:39    Ct Head Code Stroke Wo Contrast  Result Date: 03/22/2018 CLINICAL DATA:  Code stroke. Left-sided weakness. Last seen normal 12 10 EXAM: CT HEAD WITHOUT CONTRAST TECHNIQUE: Contiguous axial images were obtained from the base of the skull through the vertex without intravenous contrast. COMPARISON:  08/30/2006 FINDINGS: Brain: Moderate area of cytotoxic edema in the right occipital lobe. More well-defined cortical and subcortical low-density in the high anterior right frontal lobe, also affecting a moderate area, likely pre-existing infarct. There is mild presumed small vessel ischemic change in the cerebral white matter. No hemorrhage. No hydrocephalus. Vascular: Atherosclerotic calcification.  No high-density vessel. Skull: Negative Sinuses/Orbits: Negative Other: These results were called by telephone at the time of interpretation on 03/22/2018 at 1:13 pm to Dr. Leonarda Salon, who was already aware. ASPECTS [facility] Stroke Program Early CT Score) - Ganglionic level infarction (caudate, lentiform nuclei, internal capsule, insula, M1-M3 cortex): 7 - Supraganglionic infarction (M4-M6 cortex): 1-2 Total score (0-10 with 10 being normal): 8-9 IMPRESSION: 1. 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 2. Favored remote anterior right frontal cortex and white matter infarct. 3. Negative for hemorrhage. Electronically Signed   By: Marnee Spring M.D.   On: 03/22/2018 13:17    Ir Angio Vertebral Sel Subclavian Innominate Uni R Mod Sed  Result Date: 03/24/2018 INDICATION: Severe dysarthria, left-sided hemiplegia, and speech difficulties. Abnormal CT angiogram of the head and neck and CT perfusion. EXAM: 1. EMERGENT  LARGE VESSEL OCCLUSION THROMBOLYSIS (anterior CIRCULATION) COMPARISON:  CT angiogram of the head and neck of 03/22/2018. MEDICATIONS: Vancomycin 1 g antibiotic was administered within 1 hour of the procedure. ANESTHESIA/SEDATION: General anesthesia. CONTRAST:  Isovue 300 approximately 60 mL. FLUOROSCOPY TIME:  Fluoroscopy Time: 84 minutes 0 seconds (178 mGy). COMPLICATIONS: None immediate. TECHNIQUE: Following a  full explanation of the procedure along with the potential associated complications, an informed witnessed consent was obtained from the patient's husband and daughter. The risks of intracranial hemorrhage of 10%, worsening neurological deficit, ventilator dependency, death and inability to revascularize were all reviewed in detail with the patient's family. The patient was then put under general anesthesia by the Department of Anesthesiology at [facility]. The left groin was prepped and draped in the usual sterile fashion. Thereafter using modified Seldinger technique, transfemoral access into the left common femoral artery was obtained without difficulty. Over a 0.035 inch guidewire a 5 French Pinnacle sheath was inserted. Through this, and also over a 0.035 inch guidewire a 5 [place] JB 1 catheter was advanced to the aortic arch region and selectively positioned in the innominate artery and the right common carotid artery. FINDINGS: The innominate artery injection demonstrates extensive calcific atherosclerotic plaque at the origin of the innominate artery and also the aortic arch. Patchy calcification is also noted in the distal right subclavian artery. The right vertebral artery demonstrates approximately 50% stenosis at its origin. There is moderate tortuosity associated with this. Distal opacification is noted into the cranial skull base. The visualized portion also demonstrates opacification of the right vertebrobasilar junction, the right posterior-inferior cerebellar artery. The opacified  portion of the basilar artery, the superior cerebellar arteries and the anterior-cerebellar arteries is grossly intact into the delayed arterial phase. The right common carotid arteriogram demonstrates the right external carotid artery to be mild to moderately narrowed at its origin. Its branches are, otherwise, widely patent. The right internal carotid artery at the bulb demonstrates complete occlusion without evidence of a delayed string sign on the arterial phase. Thick calcified plaque is associated in this region. Intracranially there is delayed opacification of the distal cavernous and supraclinoid right ICA from the ophthalmic artery via the nasolacrimal collaterals. There is subsequent opacification of the right middle cerebral artery and the right anterior cerebral artery proximally. Opacification is noted into the trifurcation branches of the right middle cerebral artery. Mixing of non-opacified blood is seen in the proximal right middle cerebral artery from the contralateral internal carotid artery via the anterior communicating artery. The left common carotid arteriogram demonstrates the left external carotid artery and its major branches to be widely patent. The left internal carotid artery at the bulb demonstrates approximately 75-80% stenosis by the NASCET criteria. Distal to this, the vessel is seen to opacify widely to the cranial skull base. The petrous, cavernous and supraclinoid segments demonstrate wide patency. The left middle cerebral artery and the left anterior cerebral artery opacify into the capillary and venous phases. There is prompt cross filling via the anterior communicating artery of the left anterior cerebral artery A2 segment, A1 segment, and also the right middle cerebral artery distribution. Again noted is mixing with unopacified blood from the right anterior circulation as described above. PROCEDURE: ENDOVASCULAR REVASCULARIZATION OF OCCLUDED RIGHT INTERNAL CAROTID ARTERY. The  diagnostic JB 1 catheter in the right common carotid artery was then exchanged over a 0.035 inch 300 cm Rosen exchange guidewire for a 55 cm 8 French Brite tip neurovascular sheath using biplane roadmap technique and constant fluoroscopic guidance. Good aspiration obtained from the side port of the neurovascular sheath. This was then connected to continuous heparinized saline infusion. Over the [facility] exchange guidewire, an 85 cm 8 [place] FlowGate balloon guide catheter which been prepped with 50% contrast and 50% heparinized saline infusion was advanced and positioned just proximal to the right common carotid  bifurcation. The guidewire was removed. Good aspiration obtained from the hub of the [facility] guide catheter. Gentle contrast injection demonstrated no evidence of spasms, dissections or of intraluminal filling defects. At this time, over a 0.014 inch regular Transend EX micro guidewire, a Trevo ProVue 021 microcatheter was advanced just distal to the tip of the [facility] guide catheter. Using a torque device, attempts were made to advance the micro guidewire and the microcatheter through the occluded right internal carotid artery without success. This was then replaced with a 5 French slip catheter, and a 4 [place]French diagnostic JB 1 catheter. With each of these, using a 0.035 inch Roadrunner guidewire, access through the calcified atherosclerotic plaque could not be achieved. This was despite the advancement of the diagnostic catheters into the occluded right internal carotid artery bulb. A final attempt was then made using a Trevo ProVue 021 microcatheter with a regular Transend EX micro guidewire. The wire could only be advanced to the proximal 2/3 of the plaque as could the microcatheter. Despite multiple attempts, the more distal access into right internal carotid artery was unsuccessful. During this time, the patient was also given 3 mg of intra-arterial Integrilin into the occluded right internal carotid  artery. During this time, the patient's neurological status and hemodynamic status remained stable with the blood pressure being maintained in the region of approximately 140 to 150 mm of mercury systolic. The 8 [place] FlowGate guide catheter and the neurovascular sheath were then retrieved into the abdominal aorta and exchanged over a J-tip guidewire for an 8 [place] Pinnacle sheath. This in turn was then removed with successful hemostasis being achieved over the left common femoral puncture site with manual compression over about 20 minutes. At the end of this, the patient's left groin was soft without evidence of a hematoma. Distal pulses are were again Dopplerable in the posterior tibials, and the dorsalis pedis arteries bilaterally. A Dyna CT of the brain obtained revealed no evidence of hemorrhage or mass-effect or midline shift. The ventricles remain in the midline. The patient's general anesthesia was then reversed. The patient was extubated without difficulty. Upon recovery the patient remained appropriately responsive being able to lift her left hand against gravity. She was then transported to the PACU and then to neuro ICU for subsequent management. IMPRESSION: Attempted endovascular revascularization of symptomatic occluded right internal carotid artery at the bulb as described. PLAN: Patient was transferred to neuro PACU and neuro ICU for further management. Electronically Signed   By: Julieanne CottonSanjeev  Deveshwar M.D.   On: 03/23/2018 10:31    Labs:  Basic Metabolic Panel: Recent Labs  Lab 03/25/18 0837 03/25/18 1035 03/26/18 1101 03/29/18 0500 03/31/18 0831  NA 139  --  137  --  135  K 2.6*  --  3.7  --  4.3  CL 99*  --  101  --  101  CO2 27  --  25  --  25  GLUCOSE 108*  --  103*  --  98  BUN <5*  --  6  --  7  CREATININE 0.55  --  0.53  --  0.58  CALCIUM 8.9  --  9.0  --  9.1  MG  --  1.6*  --  2.2  --     CBC: CBC Latest Ref Rng & Units 03/29/2018 03/25/2018 03/23/2018  WBC 4.0 - 10.5  K/uL 6.4 6.3 5.5  Hemoglobin 12.0 - 15.0 g/dL 40.912.2 81.113.3 10.3(L)  Hematocrit 36.0 - 46.0 % 37.8 40.4 31.6(L)  Platelets 150 - 400 K/uL 492(H) 464(H) 383   CBG: No results for input(s): GLUCAP in the last 168 hours.  Brief HPI:   Pam Johnson is a 67 year old female with history of HTN, chronic back pain, COPD, recent visual changes and fall who was admitted on 03/22/18 with left sided weakness and reports of running into things on the left. UDS positive for THC and opiates. CT head done revealing moderate acute infarct in right occipital lobe.  CTA/perfusion scan of head neck showed right ICA occlusion beginning at common carotid bifurcation with reconstitution at ICA terminus, right-MCA/PCA completed infarct, severe proximal left-SA stenosis, 80% stenosis proximal left-ICA and nearly 50% proximal left CCA stenosis.  She underwent CT Angie with unsuccessful attempts at revascularization of proximal right ICA.  She had hypotensive episode while in CT scan and was treated with fluid bolus.  Repeat CT head showed mild evolution of her of infarct with cytotoxic edema.  Dr. Pearlean Brownie recommends aspirin Plavix for 3 months followed by aspirin alone.  MRI of brain not done due to claustrophobia.  Patient with resultant left-sided weakness, left inattention and difficulty performing tasks.  CIR was recommended due to functional deficits.   Hospital Course: Pam Johnson was admitted to rehab 03/24/2018 for inpatient therapies to consist of PT, ST and OT at least three hours five days a week. Past admission physiatrist, therapy team and rehab RN have worked together to provide customized collaborative inpatient rehab. She was maintained on ASA/Plavix during her stay and is tolerating these without SE. Follow up CBC showed H/H and platelets to be stable.  Blood pressures have been monitored on bid basis and were noted to be poorly controlled. Norvasc was titrated up to 10 mg daily.  Respiratory status has  improved and she was weaned off oxygen. She was found to be hypokalemic and had low magnesium levels. She was started K dur and Mag Ox for supplement. Follow up labs showed hypokalemia has resolved and magnesium supplemented with levels up to 2.2.    Aspercreme was added to help with bilateral knee pain. Lyrica continues to be held. OxyContin was resumed at 10 mg bid and pain has been managed with use of hydrocodone 1-2 times a day. No increase in pain reported with increase in activity tolerance. Patient and husband have been educated on new regimen.  Dr. Rush Farmer was updated on changes and requested that patient be discharged on current regimen as she had tolerated weaning  without difficulty.  Their office will contact patient for follow up in the following weeks.    Ego support has been provided by team to help manage anxiety. Dr. Kieth Brightly  (neuropsychologist) was consulted for evaluation and has educated patient on coping skills.  She is aware of visual field deficits but needs cues to compensate for this.  She has good gains during her stay and supervision is recommended due to visual field deficits, anxiety, and poor safety awareness. She will continue to receive follow up HHPT, HHOT and HHST by [facility].     Rehab course: During patient's stay in rehab weekly team conferences were held to monitor patient's progress, set goals and discuss barriers to discharge. At admission, patient required min assist for ADL tasks and basic self care tasks. Cognitive evaluation showed moderate impairments in higher level cognitive tasks with MoCA blind score 26/30 and family reported that some attention deficits to be at baseline. She has had improvement in activity tolerance, balance, postural control  as well as ability to compensate for deficits. She is able to complete ADL tasks with supervision. She is ambulating 200' X 2 with RW, cues for scanning and supervision. She is able to climb one step with min  assist. She requires min assist for basic problem solving and for recall. She requires max assist with multimodal cues to complete basic medication management. Family education was completed regarding assistance needed with cognitive tasks, ADLs as well as with mobility.   Disposition:  Home   Diet: Heart Healthy   Special Instructions: 1. Family to assist with medication management and provide supervision with activity.  2. Note medication changes.   Allergies as of 03/31/2018      Reactions   Acyclovir And Related Other (See Comments)   unknown   Ciprofloxacin Other (See Comments)   Altered mental status.   Ciprofloxacin Other (See Comments)   Confused and nausea   Penicillins Other (See Comments)   Unknown reaction Has patient had a PCN reaction causing immediate rash, facial/tongue/throat swelling, SOB or lightheadedness with hypotension: NO Has patient had a PCN reaction causing severe rash involving mucus membranes or skin necrosis: NO Has patient had a PCN reaction that required hospitalization: NO Has patient had a PCN reaction occurring within the last 10 years: NO If all of the above answers are "NO", then may proceed with Cephalosporin use.   Promethazine Other (See Comments)   Was advised by her PCP not to take this medication.   Morphine Nausea And Vomiting      Medication List    STOP taking these medications   esomeprazole 40 MG capsule Commonly known as:  NEXIUM   gabapentin 600 MG tablet Commonly known as:  NEURONTIN   hydrochlorothiazide 25 MG tablet Commonly known as:  HYDRODIURIL   ipratropium-albuterol 0.5-2.5 (3) MG/3ML Soln Commonly known as:  DUONEB   ondansetron 4 MG disintegrating tablet Commonly known as:  ZOFRAN ODT     TAKE these medications   acetaminophen 325 MG tablet Commonly known as:  TYLENOL Take 1-2 tablets (325-650 mg total) by mouth every 6 (six) hours as needed for mild pain.   albuterol 108 (90 Base) MCG/ACT  inhaler Commonly known as:  PROVENTIL HFA;VENTOLIN HFA Inhale 2 puffs into the lungs every 6 (six) hours as needed for wheezing or shortness of breath.   amLODipine 10 MG tablet Commonly known as:  NORVASC Take 1 tablet (10 mg total) by mouth daily. Start taking on:  04/01/2018 What changed:    medication strength  how much to take   aspirin 81 MG EC tablet Take 1 tablet (81 mg total) by mouth daily. Start taking on:  04/01/2018   clopidogrel 75 MG tablet Commonly known as:  PLAVIX Take 1 tablet (75 mg total) by mouth daily. Start taking on:  04/01/2018   docusate sodium 100 MG capsule Commonly known as:  COLACE Take 1 capsule (100 mg total) by mouth 2 (two) times daily.   fluticasone 50 MCG/ACT nasal spray Commonly known as:  FLONASE Place 2 sprays into both nostrils daily as needed for allergies or rhinitis.   Fluticasone-Salmeterol 500-50 MCG/DOSE Aepb Commonly known as:  ADVAIR Inhale 1 puff into the lungs 2 (two) times daily.   HYDROcodone-acetaminophen 7.5-325 MG tablet Commonly known as:  NORCO Take 1 tablet by mouth 2 (two) times daily as needed (breakthrough pain). What changed:  when to take this   hydrocortisone cream 1 % Apply 1 application topically 2 (two) times daily as  needed for itching.   metoCLOPramide 5 MG tablet Commonly known as:  REGLAN Take 5 mg by mouth 4 (four) times daily.   mirtazapine 7.5 MG tablet Commonly known as:  REMERON Take 1 tablet (7.5 mg total) by mouth at bedtime. What changed:    medication strength  how much to take   multivitamin with minerals Tabs tablet Take 1 tablet by mouth daily.   MUSCLE RUB 10-15 % Crea Apply 1 application topically 4 (four) times daily. To knees   nicotine 14 mg/24hr patch Commonly known as:  NICODERM CQ - dosed in mg/24 hours Place 1 patch (14 mg total) onto the skin daily. Start taking on:  04/01/2018   nitroGLYCERIN 0.4 MG SL tablet Commonly known as:  NITROSTAT Place 0.4 mg under the  tongue every 5 (five) minutes as needed for chest pain.   olopatadine 0.1 % ophthalmic solution Commonly known as:  PATANOL Place 1 drop into both eyes 2 (two) times daily.   oxyCODONE 10 mg 12 hr tablet--Rx # 60 pills Commonly known as:  OXYCONTIN Take 1 tablet (10 mg total) by mouth every 12 (twelve) hours. What changed:    medication strength  how much to take  when to take this   pantoprazole 40 MG tablet Commonly known as:  PROTONIX Take 1 tablet (40 mg total) by mouth daily. Start taking on:  04/01/2018   sertraline 100 MG tablet Commonly known as:  ZOLOFT Take 150 mg by mouth daily.   SINGULAIR 10 MG tablet Generic drug:  montelukast Take 10 mg by mouth every morning.   tiotropium 18 MCG inhalation capsule Commonly known as:  SPIRIVA Place 18 mcg into inhaler and inhale daily.   triamcinolone cream 0.5 % Commonly known as:  KENALOG Apply 1 application topically daily as needed (rash).      Follow-up Information    Lance Morin, MD Follow up on 04/10/2018.   Specialty:  Pediatrics Why:  appointment at Contact information: [address] [place] [address] [phone]        Erick Colace, MD Follow up.   Specialty:  Physical Medicine and Rehabilitation Why:  Office to call for appointment Contact information: [address] [place] [address] [phone]        Karma Greaser, MD Follow up.   Specialty:  Anesthesiology Contact information: [address] [place] [address] [phone]        Micki Riley, MD. Call in 3 day(s).   Specialties:  Neurology, Radiology Why:  for follow up appointment in 4 weeks Contact information: [address] [place] [address] [phone]           Signed: Jacquelynn Cree 04/03/2018, 4:59 PM

## 2018-03-31 NOTE — Progress Notes (Signed)
Speech Language Pathology Discharge Summary  Patient Details  Name: Pam Johnson MRN: [id number] Date of Birth: Jul 03, 1951  Today's Date: 03/31/2018 SLP Individual Time: 1440-1505 SLP Individual Time Calculation (min): 25 min   Skilled Therapeutic Interventions:  Pt was seen for skilled ST targeting cognitive goals.  Pt was in bed upon arrival, awake, alert, and agreeable to therapy.  SLP facilitated the session with functional conversations regarding discharge planning.  Pt was able to verbalize recommendations that she have 24/7 supervision at discharge in addition to assistance for medication and financial management.  Pt was also able to identify at least 2 activities for which she will need assistance at home due to physical and cognitive deficits.  All questions were answered to her satisfaction at this time.  Pt was left in bed with bed alarm set and call bell within reach.       Patient has met 4 of 4 long term goals.  Patient to discharge at [facility] level.  Reasons goals not met:     Clinical Impression/Discharge Summary:   Pt has made functional gains while inpatient and is discharging having met 4 out of 4 short term goals.  Pt is currently min for tasks due to mild-moderate cognitive deficits.  Pt education is complete at this time.  Pt has demonstrated improved anticipatory awareness of her deficits.  Pt is discharging home with recommendations for ST follow up at next level of care in addition to 24/7 supervision.     Care Partner:  Caregiver Able to Provide Assistance: Yes  Type of Caregiver Assistance: Physical;Cognitive  Recommendation:  24 hour supervision/assistance;Outpatient SLP;Home Health SLP  Rationale for SLP Follow Up: Maximize cognitive function and independence;Reduce caregiver burden   Equipment: none recommended by SLP    Reasons for discharge: Discharged from hospital   Patient/Family Agrees with Progress Made and Goals Achieved: Yes    Function:  Eating Eating   Modified Consistency Diet: Yes Eating Assist Level: More than reasonable amount of time   Eating Set Up Assist For: Opening containers       Cognition Comprehension Comprehension assist level: Follows basic conversation/direction with no assist  Expression   Expression assist level: Expresses basic needs/ideas: With extra time/assistive device  Social Interaction Social Interaction assist level: Interacts appropriately 90% of the time - Needs monitoring or encouragement for participation or interaction.  Problem Solving Problem solving assist level: Solves basic 75 - 89% of the time/requires cueing 10 - 24% of the time  Memory Memory assist level: Recognizes or recalls 90% of the time/requires cueing < 10% of the time   Emilio Math 03/31/2018, 4:16 PM

## 2018-03-31 NOTE — Progress Notes (Signed)
Occupational Therapy Session Note  Patient Details  Name: Pam RogerConstance M Johnson MRN: [id number] Date of Birth: 09/14/1951  Today's Date: 03/31/2018 OT Individual Time: [id number] OT Individual Time Calculation (min): 75 min    Short Term Goals: Week 1:  OT Short Term Goal 1 (Week 1): STGs=LTGs due to ELOS  Skilled Therapeutic Interventions/Progress Updates:    Pt seen for OT ADL bathing/dressing session. Pt in supine upon arrival with RN present administering morning meds. Pt agreeable tx session and desiring to shower.  She ambulated thorughout session with use of rollator, occasional cuing for rollator management and attention to environmental obstacles on L side 2/2 visual field cut.  She gathered clothing items from drawer, requiring VCs due to poor attention to task and easily distracted by internal and external factors.  She bathed seated on tub bench, standing with use of grab bars to complete pericare/ buttock hygiene. She returned to EOB to dress, completing dressing task with supervision, VCs for attention/ awareness as pt stood to pull up LB clothing, however, only pulled up underwear and left pants down. She completed grooming tasks standing at sink with increased time to complete tasks and supervision for standing balance.  Following seated rest break, she ambulated to ADL apartment and completed simulated tub/shower transfer utilizing tub bench, able to correctly recall technique for transfer and completed with supervision. Discussed installation of grab bars and need for supervision during ADL tasks. Pt returned to room and complete UE strengthening exercises as part of HEP utilizing level II (orange) theraband.  Completed x10 diagonal up and x10 ABduction. Pt required max multimodal cuing due to decreased attention to task and easy distractibility by internal and external factors.   Therapy Documentation Precautions:  Precautions Precautions: Fall Precaution Comments: Lt  inattention, visual deficits  Restrictions Weight Bearing Restrictions: No Pain: Pain Assessment Pain Scale: 0-10 Pain Score: 0-No pain  ADL: ADL ADL Comments: Please see functional navigator for ADL status  See Function Navigator for Current Functional Status.   Therapy/Group: Individual Therapy  [name] L 03/31/2018, 6:45 AM

## 2018-04-01 NOTE — Progress Notes (Signed)
[place] PHYSICAL MEDICINE & REHABILITATION     PROGRESS NOTE  Subjective/Complaints:   No new complaints this morning.  Anxious about going home.  ROS: Patient denies fever, rash, sore throat, blurred vision, nausea, vomiting, diarrhea, cough, shortness of breath or chest pain, joint or back pain, headache, or mood change.   Objective: Vital Signs: Blood pressure (!) 155/92, pulse 74, temperature 97.8 F (36.6 C), temperature source Oral, resp. rate 18, height 5\' 4"  (1.626 m), weight 51 kg (112 lb 7 oz), SpO2 95 %. No results found. No results for input(s): WBC, HGB, HCT, PLT in the last 72 hours. Recent Labs    03/31/18 0831  NA 135  K 4.3  CL 101  GLUCOSE 98  BUN 7  CREATININE 0.58  CALCIUM 9.1   CBG (last 3)  No results for input(s): GLUCAP in the last 72 hours.  Wt Readings from Last 3 Encounters:  03/29/18 51 kg (112 lb 7 oz)  03/22/18 51.4 kg (113 lb 5.1 oz)  01/27/16 51.3 kg (113 lb)    Physical Exam:  BP (!) 155/92   Pulse 74   Temp 97.8 F (36.6 C) (Oral)   Resp 18   Ht 5\' 4"  (1.626 m)   Wt 51 kg (112 lb 7 oz)   SpO2 95%   BMI 19.30 kg/m  Constitutional: No distress . Vital signs reviewed. HEENT: EOMI, oral membranes moist Neck: supple Cardiovascular: RRR without murmur. No JVD    Respiratory: Occasional wheezes    GI: BS +, non-tender, non-distended  Neurological: alert and oriented. + Benson Fair insight and awareness.  Motor: LUE 4+/5 prox to distal.  LLE 4/5 proximally, 4+/5 distally RUE/RLE 5/5 (no changes).  Skin: She is not diaphoretic. BUE with multiple healed skin tears and ecchymotic areas. Bilateral shins with multiple petechial areas    Psychiatric: She has a normal mood and affect. Her behavior is normal.   Assessment/Plan: 1. Functional deficits secondary to right acute/subacute parietal-occipital lobe infarct which require 3+ hours per day of interdisciplinary therapy in a comprehensive inpatient rehab setting. Physiatrist is  providing close team supervision and 24 hour management of active medical problems listed below. Physiatrist and rehab team continue to assess barriers to discharge/monitor patient progress toward functional and medical goals.  Function:  Bathing Bathing position Bathing activity did not occur: Refused Position: Systems developer parts bathed by patient: Right arm, Left arm, Chest, Abdomen, Front perineal area, Buttocks, Right upper leg, Left upper leg, Right lower leg, Left lower leg, Back Body parts bathed by helper: Back  Bathing assist Assist Level: Supervision or verbal cues      Upper Body Dressing/Undressing Upper body dressing   What is the patient wearing?: Pull over shirt/dress     Pull over shirt/dress - Perfomed by patient: Thread/unthread right sleeve, Thread/unthread left sleeve, Put head through opening, Pull shirt over trunk          Upper body assist Assist Level: More than reasonable time      Lower Body Dressing/Undressing Lower body dressing   What is the patient wearing?: Underwear, Pants, Socks, Shoes Underwear - Performed by patient: Thread/unthread right underwear leg, Thread/unthread left underwear leg, Pull underwear up/down   Pants- Performed by patient: Thread/unthread right pants leg, Thread/unthread left pants leg, Pull pants up/down   Non-skid slipper socks- Performed by patient: Don/doff right sock, Don/doff left sock   Socks - Performed by patient: Don/doff right sock, Don/doff left sock   Shoes -  Performed by patient: Don/doff left shoe, Fasten left, Don/doff right shoe, Fasten right Shoes - Performed by helper: Don/doff right shoe, Fasten right          Lower body assist Assist for lower body dressing: Supervision or verbal cues      Toileting Toileting Toileting activity did not occur: No continent bowel/bladder event Toileting steps completed by patient: Adjust clothing prior to toileting, Performs perineal hygiene, Adjust  clothing after toileting Toileting steps completed by helper: Adjust clothing prior to toileting, Performs perineal hygiene, Adjust clothing after toileting(per Candice, NT report) Toileting Assistive Devices: Grab bar or rail  Toileting assist Assist level: Supervision or verbal cues   Transfers Chair/bed transfer   Chair/bed transfer method: Ambulatory Chair/bed transfer assist level: Supervision or verbal cues Chair/bed transfer assistive device: Patent attorneyWalker     Locomotion Ambulation     Max distance: >150 ft  Assist level: Supervision or verbal cues   Wheelchair Wheelchair activity did not occur: N/A        Cognition Comprehension Comprehension assist level: Follows basic conversation/direction with no assist  Expression Expression assist level: Expresses basic needs/ideas: With extra time/assistive device  Social Interaction Social Interaction assist level: Interacts appropriately 90% of the time - Needs monitoring or encouragement for participation or interaction.  Problem Solving Problem solving assist level: Solves basic 75 - 89% of the time/requires cueing 10 - 24% of the time  Memory Memory assist level: Recognizes or recalls 90% of the time/requires cueing < 10% of the time    Medical Problem List and Plan:  1. Functional and mobility deficits secondary to right acute/subacute parietal-occipital lobe infarct on 5/29-Poor attention to Left side  ASA and Clopidigrel  Continue CIR PT, OT, SLP  -Discharge home today.  Follow-up with rehab MD for transitional care visit 2. DVT Prophylaxis/Anticoagulation: Pharmaceutical: Lovenox plt slighly elevated 492K 3. 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   Resumed OxyContin at 10 mg bid and monitor for tolerance.  Continue hydrocodone prn Took 3-4 tabs per day at Home- currently on 7.5mg  q6h prn 4. Mood: LCSW to follow for evaluation and support.   5. Neuropsych: This patient is capable of making decisions on her own behalf.  6. Skin/Wound Care: routine pressure relief measures.  7. Fluids/Electrolytes/Nutrition: Monitor I/O. 630ml recorded yest, BUN 5, may d/c IV   D3 thins, advance diet as tolerated 8. COPD: Last exacerbation 12/2017. Continue Dulera bid, Singulair and spiriva  9. HTN: Monitor BP bid. 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   On Norvasc 2.5 PTA, restarted on 6/2- increase to 5mg  on 6/3 increased to 10mg  on 6/6 , PCP f/u as OP in 1-2 wks--still with suboptimal control   Hydralazine when necessary   Vitals:   04/01/18 0817 04/01/18 0849  BP: (!) 155/92   Pulse:  74  Resp:  18  Temp:    SpO2:  95%   10. 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 Tobacco abuse: Continue to encourage cessation. Added nicotine patch.  12. H/o anxiety disorder: On Remeron and Zoloft daily   -No Klonopin prescription provided  13. Advanced OA right wrist: Continue splint with local measures.  15. Hypokalemia- improved post supplementation- recheck 6/2- normal 3.7- will repeat today, review prior to D/C  Potassium 2.6 on 6/1, supplemented with IV therapy on 6/1 and daily supplementation started on 6/1   16.  Hypomagnesemia  Magnesium 1.6 and 6/1  Supplement initiated on 6/2- recheck 6/5- normal 2.2 17.  Insomnia- on ambien while in hospital only   LOS (Days) 8 A FACE TO FACE EVALUATION WAS PERFORMED  Ranelle Oyster 04/01/2018 8:56 AM

## 2018-04-01 NOTE — Progress Notes (Signed)
Patient discharged with husband. Instructions discussed and pt aware. Verbalized understanding no c/o pain at this time. Belonging sent with patients in bags. Transported out by staff.

## 2018-04-03 ENCOUNTER — Inpatient Hospital Stay (HOSPITAL_COMMUNITY): Payer: Self-pay | Admitting: Speech Pathology

## 2018-04-04 ENCOUNTER — Telehealth: Payer: Self-pay | Admitting: Registered Nurse

## 2018-04-04 NOTE — Telephone Encounter (Signed)
Transitional Care call Transitional Care Call Questions Answered by [name] Husband  Patient name: Pam RidingConstance Johnson  DOB: [birth year] 1. Are you/is patient experiencing any problems since coming home? No a. Are there any questions regarding any aspect of care? No 2. Are there any questions regarding medications administration/dosing? No a. Are meds being taken as prescribed? Yes, Medication List Reviewed b. "Patient should review meds with caller to confirm"  3. Have there been any falls? No 4. Has Home Health been to the house and/or have they contacted you? [facility] scheduled to come out on 04/05/2018. a. If not, have you tried to contact them? NA b. Can we help you contact them? NA 5. Are bowels and bladder emptying properly? Yes a. Are there any unexpected incontinence issues? No b. If applicable, is patient following bowel/bladder programs? NA 6. Any fevers, problems with breathing, unexpected pain? No 7. Are there any skin problems or new areas of breakdown? No 8. Has the patient/family member arranged specialty MD follow up (ie cardiology/neurology/renal/surgical/etc.)?  [name] will be scheduling follow up appointments next week.  a. Can we help arrange? NA 9. Does the patient need any other services or support that we can help arrange? No 10. Are caregivers following through as expected in assisting the patient? Yes 11. Has the patient quit smoking, drinking alcohol, or using drugs as recommended? [name] reports [name] has quit smoking, doesn't drink alcohol or use illicit drugs.   Appointment date/time 04/11/2018 arrival time 2:30 and appointment 3:00 with Dr. Wynn BankerKirsteins. At [address]

## 2018-04-05 ENCOUNTER — Encounter (HOSPITAL_COMMUNITY): Payer: Self-pay

## 2018-04-05 ENCOUNTER — Inpatient Hospital Stay (HOSPITAL_COMMUNITY)
Admission: EM | Admit: 2018-04-05 | Discharge: 2018-04-07 | DRG: 312 | Discharge status: Against Medical Advice | Payer: Medicare Other | Attending: Family Medicine | Admitting: Family Medicine

## 2018-04-05 ENCOUNTER — Other Ambulatory Visit: Payer: Self-pay

## 2018-04-05 ENCOUNTER — Emergency Department (HOSPITAL_COMMUNITY): Payer: Medicare Other

## 2018-04-05 DIAGNOSIS — Z5321 Procedure and treatment not carried out due to patient leaving prior to being seen by health care provider: Secondary | ICD-10-CM | POA: Diagnosis not present

## 2018-04-05 DIAGNOSIS — E876 Hypokalemia: Secondary | ICD-10-CM | POA: Diagnosis present

## 2018-04-05 DIAGNOSIS — F32A Depression, unspecified: Secondary | ICD-10-CM | POA: Diagnosis present

## 2018-04-05 DIAGNOSIS — R4182 Altered mental status, unspecified: Secondary | ICD-10-CM

## 2018-04-05 DIAGNOSIS — Z7902 Long term (current) use of antithrombotics/antiplatelets: Secondary | ICD-10-CM

## 2018-04-05 DIAGNOSIS — I959 Hypotension, unspecified: Secondary | ICD-10-CM | POA: Diagnosis not present

## 2018-04-05 DIAGNOSIS — J449 Chronic obstructive pulmonary disease, unspecified: Secondary | ICD-10-CM | POA: Diagnosis present

## 2018-04-05 DIAGNOSIS — Z7951 Long term (current) use of inhaled steroids: Secondary | ICD-10-CM

## 2018-04-05 DIAGNOSIS — J410 Simple chronic bronchitis: Secondary | ICD-10-CM | POA: Diagnosis not present

## 2018-04-05 DIAGNOSIS — R531 Weakness: Secondary | ICD-10-CM

## 2018-04-05 DIAGNOSIS — Z79891 Long term (current) use of opiate analgesic: Secondary | ICD-10-CM

## 2018-04-05 DIAGNOSIS — I639 Cerebral infarction, unspecified: Secondary | ICD-10-CM | POA: Diagnosis not present

## 2018-04-05 DIAGNOSIS — I952 Hypotension due to drugs: Principal | ICD-10-CM | POA: Diagnosis present

## 2018-04-05 DIAGNOSIS — E871 Hypo-osmolality and hyponatremia: Secondary | ICD-10-CM | POA: Diagnosis not present

## 2018-04-05 DIAGNOSIS — Z885 Allergy status to narcotic agent status: Secondary | ICD-10-CM

## 2018-04-05 DIAGNOSIS — K219 Gastro-esophageal reflux disease without esophagitis: Secondary | ICD-10-CM | POA: Diagnosis present

## 2018-04-05 DIAGNOSIS — F329 Major depressive disorder, single episode, unspecified: Secondary | ICD-10-CM | POA: Diagnosis present

## 2018-04-05 DIAGNOSIS — I10 Essential (primary) hypertension: Secondary | ICD-10-CM | POA: Diagnosis present

## 2018-04-05 DIAGNOSIS — Y92009 Unspecified place in unspecified non-institutional (private) residence as the place of occurrence of the external cause: Secondary | ICD-10-CM

## 2018-04-05 DIAGNOSIS — Z8249 Family history of ischemic heart disease and other diseases of the circulatory system: Secondary | ICD-10-CM

## 2018-04-05 DIAGNOSIS — F17210 Nicotine dependence, cigarettes, uncomplicated: Secondary | ICD-10-CM | POA: Diagnosis present

## 2018-04-05 DIAGNOSIS — Z96641 Presence of right artificial hip joint: Secondary | ICD-10-CM

## 2018-04-05 DIAGNOSIS — I6521 Occlusion and stenosis of right carotid artery: Secondary | ICD-10-CM | POA: Diagnosis present

## 2018-04-05 DIAGNOSIS — I69354 Hemiplegia and hemiparesis following cerebral infarction affecting left non-dominant side: Secondary | ICD-10-CM

## 2018-04-05 DIAGNOSIS — Z880 Allergy status to penicillin: Secondary | ICD-10-CM

## 2018-04-05 DIAGNOSIS — E440 Moderate protein-calorie malnutrition: Secondary | ICD-10-CM

## 2018-04-05 DIAGNOSIS — Z90710 Acquired absence of both cervix and uterus: Secondary | ICD-10-CM

## 2018-04-05 DIAGNOSIS — Z9049 Acquired absence of other specified parts of digestive tract: Secondary | ICD-10-CM

## 2018-04-05 DIAGNOSIS — Z681 Body mass index (BMI) 19 or less, adult: Secondary | ICD-10-CM

## 2018-04-05 DIAGNOSIS — I700 Atherosclerosis of aorta: Secondary | ICD-10-CM | POA: Diagnosis present

## 2018-04-05 DIAGNOSIS — Z881 Allergy status to other antibiotic agents status: Secondary | ICD-10-CM

## 2018-04-05 DIAGNOSIS — Z888 Allergy status to other drugs, medicaments and biological substances status: Secondary | ICD-10-CM

## 2018-04-05 DIAGNOSIS — T461X5A Adverse effect of calcium-channel blockers, initial encounter: Secondary | ICD-10-CM | POA: Diagnosis present

## 2018-04-05 DIAGNOSIS — Z7982 Long term (current) use of aspirin: Secondary | ICD-10-CM

## 2018-04-05 LAB — I-STAT CHEM 8, ED
BUN: 8 mg/dL (ref 6–20)
CALCIUM ION: 1.04 mmol/L — AB (ref 1.15–1.40)
Chloride: 85 mmol/L — ABNORMAL LOW (ref 101–111)
Creatinine, Ser: 0.8 mg/dL (ref 0.44–1.00)
GLUCOSE: 109 mg/dL — AB (ref 65–99)
HEMATOCRIT: 37 % (ref 36.0–46.0)
Hemoglobin: 12.6 g/dL (ref 12.0–15.0)
Potassium: 2.4 mmol/L — CL (ref 3.5–5.1)
Sodium: 126 mmol/L — ABNORMAL LOW (ref 135–145)
TCO2: 26 mmol/L (ref 22–32)

## 2018-04-05 LAB — CBC
HCT: 35.1 % — ABNORMAL LOW (ref 36.0–46.0)
Hemoglobin: 11.7 g/dL — ABNORMAL LOW (ref 12.0–15.0)
MCH: 29.4 pg (ref 26.0–34.0)
MCHC: 33.3 g/dL (ref 30.0–36.0)
MCV: 88.2 fL (ref 78.0–100.0)
Platelets: 569 10*3/uL — ABNORMAL HIGH (ref 150–400)
RBC: 3.98 MIL/uL (ref 3.87–5.11)
RDW: 14.6 % (ref 11.5–15.5)
WBC: 8.7 10*3/uL (ref 4.0–10.5)

## 2018-04-05 LAB — DIFFERENTIAL
Abs Immature Granulocytes: 0 10*3/uL (ref 0.0–0.1)
Basophils Absolute: 0.1 10*3/uL (ref 0.0–0.1)
Basophils Relative: 1 %
EOS PCT: 3 %
Eosinophils Absolute: 0.2 10*3/uL (ref 0.0–0.7)
Immature Granulocytes: 0 %
LYMPHS ABS: 1.8 10*3/uL (ref 0.7–4.0)
Lymphocytes Relative: 21 %
MONO ABS: 1.2 10*3/uL — AB (ref 0.1–1.0)
MONOS PCT: 13 %
Neutro Abs: 5.4 10*3/uL (ref 1.7–7.7)
Neutrophils Relative %: 62 %

## 2018-04-05 LAB — COMPREHENSIVE METABOLIC PANEL
ALK PHOS: 74 U/L (ref 38–126)
ALT: 14 U/L (ref 14–54)
ANION GAP: 13 (ref 5–15)
AST: 24 U/L (ref 15–41)
Albumin: 3.5 g/dL (ref 3.5–5.0)
BILIRUBIN TOTAL: 0.4 mg/dL (ref 0.3–1.2)
BUN: 8 mg/dL (ref 6–20)
CALCIUM: 9 mg/dL (ref 8.9–10.3)
CO2: 27 mmol/L (ref 22–32)
Chloride: 87 mmol/L — ABNORMAL LOW (ref 101–111)
Creatinine, Ser: 0.86 mg/dL (ref 0.44–1.00)
GFR calc Af Amer: >60 mL/min (ref >60)
Glucose, Bld: 114 mg/dL — ABNORMAL HIGH (ref 65–99)
Potassium: 2.4 mmol/L — CL (ref 3.5–5.1)
Sodium: 127 mmol/L — ABNORMAL LOW (ref 135–145)
TOTAL PROTEIN: 6.9 g/dL (ref 6.5–8.1)

## 2018-04-05 LAB — APTT: aPTT: 28 seconds (ref 24–36)

## 2018-04-05 LAB — PROTIME-INR
INR: 0.93
PROTHROMBIN TIME: 12.4 s (ref 11.4–15.2)

## 2018-04-05 LAB — I-STAT TROPONIN, ED: TROPONIN I, POC: 0.01 ng/mL (ref 0.00–0.08)

## 2018-04-05 MED ORDER — POTASSIUM CHLORIDE 10 MEQ/100ML IV SOLN
10.0000 meq | Freq: Once | INTRAVENOUS | Status: DC
  Filled 2018-04-05: qty 100

## 2018-04-05 MED ORDER — SODIUM CHLORIDE 0.9 % IV BOLUS
1000.0000 mL | Freq: Once | INTRAVENOUS | Status: AC
  Administered 2018-04-05: 1000 mL via INTRAVENOUS

## 2018-04-05 MED ORDER — POTASSIUM CHLORIDE 20 MEQ/15ML (10%) PO SOLN
60.0000 meq | Freq: Once | ORAL | Status: AC
  Administered 2018-04-06: 60 meq via ORAL
  Filled 2018-04-05: qty 45

## 2018-04-05 MED ORDER — POTASSIUM CHLORIDE 10 MEQ/100ML IV SOLN
10.0000 meq | INTRAVENOUS | Status: AC
Start: 2018-04-06 — End: 2018-04-06
  Administered 2018-04-05 – 2018-04-06 (×2): 10 meq via INTRAVENOUS
  Filled 2018-04-05: qty 100

## 2018-04-05 MED ORDER — MAGNESIUM SULFATE IN D5W 1-5 GM/100ML-% IV SOLN
1.0000 g | Freq: Once | INTRAVENOUS | Status: AC
  Administered 2018-04-05: 1 g via INTRAVENOUS
  Filled 2018-04-05: qty 100

## 2018-04-05 MED ORDER — SODIUM CHLORIDE 0.9 % IV SOLN
INTRAVENOUS | Status: DC
  Administered 2018-04-05: via INTRAVENOUS
  Administered 2018-04-06 – 2018-04-07 (×2): 1000 mL via INTRAVENOUS

## 2018-04-05 NOTE — H&P (Signed)
History and Physical    Pam Johnson:[id number] DOB: [birth year] DOA: 04/05/2018  Referring MD/NP/PA:   PCP: Patient, No Pcp Per   Patient coming from:  The patient is coming from home.  At baseline, pt is independent for most of ADL.  Chief Complaint: Worsening left-sided weakness, abdominal pain  HPI: Pam Johnson is a 67 y.o. female with medical history significant of hypertension, COPD, R carotid occlusion, stroke with mild left-sided weakness,, GERD, spinal stenosis, depression, who presents with worsening left-sided weakness and abdominal pain.  Pt has mild left sided weakness from previous stroke.  Today she has worsening left-sided weakness. Per his husband, pt was doing fine until around 8:45 PM, when she was noted by husband to have worsening left sided weakness. No slurred speech, vision change or hearing loss.  Patient denies any chest pain, shortness of breath.  She has some mild cough with little yellow-colored mucus production.  No fever or chills.  Patient states that she feels nauseated and has abdominal pain, which is located in the central abdomen, constant, moderate, dull, nonradiating.  No vomiting or diarrhea. Patient states that she has a hernia repair in the same location in the past.  She also reports burning on urination and increased urinary frequency, but no dysuria. Of note, pt had hx of R carotid occlusion and failed IV TPA and IR intervention treatment for revascularization. Per report, pt was found to have hypotension with SBP 70s, which improved to 93/45 without specific intervention in ED. Her husband states that pt newly started blood pressure medications (amlodipine on list) in the past several days.  Ed Course: pt was found to have WBC 8.7, negative troponin, INR 0.93, PTT 28, potassium 2.4, sodium 126, creatinine normal, tachycardia, tachypnea, CT of head is negative for acute intracranial abnormalities patient is placed on telemetry bed for  observation.  Neurology, Dr. Amada Jupiter was consulted.  Review of Systems:   General: no fevers, chills, no body weight gain, has poor appetite, has fatigue HEENT: no blurry vision, hearing changes or sore throat Respiratory: no dyspnea, has coughing, no wheezing CV: no chest pain, no palpitations GI: has nausea and abdominal pain, no vomiting, diarrhea, constipation GU: no dysuria, burning on urination, increased urinary frequency, hematuria  Ext: no leg edema Neuro:  no vision change or hearing loss. Has left sided weakness. Skin: has bruises in arms. MSK: No muscle spasm, no deformity, no limitation of range of movement in spin Heme: No easy bruising.  Travel history: No recent long distant travel.  Allergy:  Allergies  Allergen Reactions  . Acyclovir And Related Other (See Comments)    unknown  . Ciprofloxacin Other (See Comments)    Altered mental status.  . Ciprofloxacin Other (See Comments)    Confused and nausea   . Penicillins Other (See Comments)    Unknown reaction Has patient had a PCN reaction causing immediate rash, facial/tongue/throat swelling, SOB or lightheadedness with hypotension: NO Has patient had a PCN reaction causing severe rash involving mucus membranes or skin necrosis: NO Has patient had a PCN reaction that required hospitalization: NO Has patient had a PCN reaction occurring within the last 10 years: NO If all of the above answers are "NO", then may proceed with Cephalosporin use.   . Promethazine Other (See Comments)    Was advised by her PCP not to take this medication.  . Morphine Nausea And Vomiting    Past Medical History:  Diagnosis Date  . Acid reflux   .  COPD (chronic obstructive pulmonary disease) (HCC)   . Hypertension   . Sacral fracture (HCC)   . Spinal stenosis     Past Surgical History:  Procedure Laterality Date  . ABDOMINAL HYSTERECTOMY    . CHOLECYSTECTOMY    . HERNIA REPAIR    . IR ANGIO INTRA EXTRACRAN SEL COM  CAROTID INNOMINATE UNI L MOD SED  03/22/2018  . IR ANGIO VERTEBRAL SEL SUBCLAVIAN INNOMINATE UNI R MOD SED  03/22/2018  . IR CT HEAD LTD  03/22/2018  . IR PERCUTANEOUS ART THROMBECTOMY/INFUSION INTRACRANIAL INC DIAG ANGIO  03/22/2018  . NISSEN FUNDOPLICATION    . RADIOLOGY WITH ANESTHESIA N/A 03/22/2018   Procedure: IR WITH ANESTHESIA;  Surgeon: Julieanne Cotton, MD;  Location: [facility] OR;  Service: Radiology;  Laterality: N/A;  . TOTAL HIP ARTHROPLASTY Right 11/2017    Social History:  reports that she has been smoking cigarettes.  She has been smoking about 1.00 pack per day. She has never used smokeless tobacco. She reports that she drinks alcohol. She reports that she does not use drugs.  Family History:  Family History  Problem Relation Age of Onset  . Hypertension Mother   . Hypertension Father      Prior to Admission medications   Medication Sig Start Date End Date Taking? Authorizing Provider  acetaminophen (TYLENOL) 325 MG tablet Take 1-2 tablets (325-650 mg total) by mouth every 6 (six) hours as needed for mild pain. 03/31/18   Love, Evlyn Kanner, PA-C  albuterol (PROVENTIL HFA;VENTOLIN HFA) 108 (90 BASE) MCG/ACT inhaler Inhale 2 puffs into the lungs every 6 (six) hours as needed for wheezing or shortness of breath.    [provider]  amLODipine (NORVASC) 10 MG tablet Take 1 tablet (10 mg total) by mouth daily. 04/01/18   Love, Evlyn Kanner, PA-C  aspirin 81 MG EC tablet Take 1 tablet (81 mg total) by mouth daily. 04/01/18   Love, Evlyn Kanner, PA-C  clopidogrel (PLAVIX) 75 MG tablet Take 1 tablet (75 mg total) by mouth daily. 04/01/18   Love, Evlyn Kanner, PA-C  docusate sodium (COLACE) 100 MG capsule Take 1 capsule (100 mg total) by mouth 2 (two) times daily. 03/31/18   Love, Evlyn Kanner, PA-C  fluticasone (FLONASE) 50 MCG/ACT nasal spray Place 2 sprays into both nostrils daily as needed for allergies or rhinitis.    [provider]  Fluticasone-Salmeterol (ADVAIR) 500-50 MCG/DOSE AEPB Inhale 1  puff into the lungs 2 (two) times daily.    [provider]  HYDROcodone-acetaminophen (NORCO) 7.5-325 MG tablet Take 1 tablet by mouth 2 (two) times daily as needed (breakthrough pain). 03/31/18   Love, Evlyn Kanner, PA-C  hydrocortisone cream 1 % Apply 1 application topically 2 (two) times daily as needed for itching.    [provider]  Menthol-Methyl Salicylate (MUSCLE RUB) 10-15 % CREA Apply 1 application topically 4 (four) times daily. To knees 03/31/18   Love, Evlyn Kanner, PA-C  metoCLOPramide (REGLAN) 5 MG tablet Take 5 mg by mouth 4 (four) times daily.    [provider]  mirtazapine (REMERON) 7.5 MG tablet Take 1 tablet (7.5 mg total) by mouth at bedtime. 03/31/18   Love, Evlyn Kanner, PA-C  montelukast (SINGULAIR) 10 MG tablet Take 10 mg by mouth every morning.    [provider]  Multiple Vitamin (MULTIVITAMIN WITH MINERALS) TABS tablet Take 1 tablet by mouth daily.    [provider]  nicotine (NICODERM CQ - DOSED IN MG/24 HOURS) 14 mg/24hr patch Place  1 patch (14 mg total) onto the skin daily. 04/01/18   Love, Evlyn Kanner, PA-C  nitroGLYCERIN (NITROSTAT) 0.4 MG SL tablet Place 0.4 mg under the tongue every 5 (five) minutes as needed for chest pain.    [provider]  olopatadine (PATANOL) 0.1 % ophthalmic solution Place 1 drop into both eyes 2 (two) times daily.    [provider]  oxyCODONE (OXYCONTIN) 10 mg 12 hr tablet Take 1 tablet (10 mg total) by mouth every 12 (twelve) hours. 03/31/18   Love, Evlyn Kanner, PA-C  pantoprazole (PROTONIX) 40 MG tablet Take 1 tablet (40 mg total) by mouth daily. 04/01/18   Love, Evlyn Kanner, PA-C  sertraline (ZOLOFT) 100 MG tablet Take 150 mg by mouth daily.    [provider]  tiotropium (SPIRIVA) 18 MCG inhalation capsule Place 18 mcg into inhaler and inhale daily.    [provider]  triamcinolone cream (KENALOG) 0.5 % Apply 1 application topically daily as needed (rash).    [provider]      Physical Exam: Vitals:   04/05/18 2315 04/05/18 2330 04/05/18 2345 04/06/18 0000  BP: (!) 94/59  (!) 111/54 127/67  Pulse: 64 (!) 58 61 72  Resp: 18 (!) 29 15 (!) 21  SpO2: 97% 97% 97% 97%  Weight:      Height:       General: Not in acute distress.  Cachectic looking HEENT:       Eyes: PERRL, EOMI, no scleral icterus.       ENT: No discharge from the ears and nose, no pharynx injection, no tonsillar enlargement.        Neck: No JVD, no bruit, no mass felt. Heme: No neck lymph node enlargement. Cardiac: S1/S2, RRR, No murmurs, No gallops or rubs. Respiratory: No rales, wheezing, rhonchi or rubs. GI: Soft, nondistended, has mild tenderness in central abdomen, no rebound pain, no organomegaly, BS present. GU: No hematuria Ext: No pitting leg edema bilaterally. 2+DP/PT pulse bilaterally. Musculoskeletal: No joint deformities, No joint redness or warmth, no limitation of ROM in spin. Skin: No rashes.  Neuro: Alert, oriented X3, cranial nerves II-XII grossly intact, has mild left sided weakness. Psych: Patient is not psychotic, no suicidal or hemocidal ideation.  Labs on Admission: I have personally reviewed following labs and imaging studies  CBC: Recent Labs  Lab 04/05/18 2215 04/05/18 2221  WBC 8.7  --   NEUTROABS 5.4  --   HGB 11.7* 12.6  HCT 35.1* 37.0  MCV 88.2  --   PLT 569*  --    Basic Metabolic Panel: Recent Labs  Lab 03/31/18 0831 04/05/18 2215 04/05/18 2221  NA 135 127* 126*  K 4.3 2.4* 2.4*  CL 101 87* 85*  CO2 25 27  --   GLUCOSE 98 114* 109*  BUN 7 8 8   CREATININE 0.58 0.86 0.80  CALCIUM 9.1 9.0  --    GFR: Estimated Creatinine Clearance: 54.9 mL/min (by C-G formula based on SCr of 0.8 mg/dL). Liver Function Tests: Recent Labs  Lab 04/05/18 2215  AST 24  ALT 14  ALKPHOS 74  BILITOT 0.4  PROT 6.9  ALBUMIN 3.5   No results for input(s): LIPASE, AMYLASE in the last 168 hours. No results for input(s): AMMONIA in the last 168  hours. Coagulation Profile: Recent Labs  Lab 04/05/18 2215  INR 0.93   Cardiac Enzymes: No results for input(s): CKTOTAL, CKMB, CKMBINDEX, TROPONINI in the last 168 hours. BNP (last 3 results) No  results for input(s): PROBNP in the last 8760 hours. HbA1C: No results for input(s): HGBA1C in the last 72 hours. CBG: No results for input(s): GLUCAP in the last 168 hours. Lipid Profile: No results for input(s): CHOL, HDL, LDLCALC, TRIG, CHOLHDL, LDLDIRECT in the last 72 hours. Thyroid Function Tests: No results for input(s): TSH, T4TOTAL, FREET4, T3FREE, THYROIDAB in the last 72 hours. Anemia Panel: No results for input(s): VITAMINB12, FOLATE, FERRITIN, TIBC, IRON, RETICCTPCT in the last 72 hours. Urine analysis:    Component Value Date/Time   COLORURINE YELLOW 03/22/2018 1402   APPEARANCEUR CLEAR 03/22/2018 1402   LABSPEC 1.038 (H) 03/22/2018 1402   PHURINE 5.0 03/22/2018 1402   GLUCOSEU NEGATIVE 03/22/2018 1402   HGBUR NEGATIVE 03/22/2018 1402   BILIRUBINUR NEGATIVE 03/22/2018 1402   KETONESUR NEGATIVE 03/22/2018 1402   PROTEINUR NEGATIVE 03/22/2018 1402   NITRITE NEGATIVE 03/22/2018 1402   LEUKOCYTESUR NEGATIVE 03/22/2018 1402   Sepsis Labs: @LABRCNTIP (procalcitonin:4,lacticidven:4) )No results found for this or any previous visit (from the past 240 hour(s)).   Radiological Exams on Admission: Ct Head Code Stroke Wo Contrast  Result Date: 04/05/2018 CLINICAL DATA:  Code stroke. Left-sided weakness, facial droop and slurred speech. EXAM: CT HEAD WITHOUT CONTRAST TECHNIQUE: Contiguous axial images were obtained from the base of the skull through the vertex without intravenous contrast. COMPARISON:  Head CT 03/23/2018 FINDINGS: Brain: There is no mass, hemorrhage or extra-axial collection. The size and configuration of the ventricles and extra-axial CSF spaces are normal. There is an old right frontal lobe infarct. Old left lentiform nucleus small infarct. There is  hypoattenuation of the periventricular white matter, most commonly indicating chronic ischemic microangiopathy. Vascular: No abnormal hyperdensity of the major intracranial arteries or dural venous sinuses. No intracranial atherosclerosis. Skull: The visualized skull base, calvarium and extracranial soft tissues are normal. Sinuses/Orbits: No fluid levels or advanced mucosal thickening of the visualized paranasal sinuses. No mastoid or middle ear effusion. The orbits are normal. ASPECTS [facility]([place] Stroke Program Early CT Score) - Ganglionic level infarction (caudate, lentiform nuclei, internal capsule, insula, M1-M3 cortex): 7 - Supraganglionic infarction (M4-M6 cortex): 3 Total score (0-10 with 10 being normal): 10 IMPRESSION: 1. No acute hemorrhage or mass effect. 2. 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 3. ASPECTS is 10. These results were communicated to Dr. Ritta SlotMcNeill Kirkpatrick at 10:29 pm on 04/05/2018 by text page via the [facility] system. Electronically Signed   By: Deatra RobinsonKevin  Herman M.D.   On: 04/05/2018 22:32     EKG:  Not done in ED, will get one.   Assessment/Plan Principal Problem:   Left-sided weakness Active Problems:   Stroke (cerebrum) (HCC)   Essential hypertension   Chronic obstructive pulmonary disease (HCC)   Hypokalemia   Hypotension   GERD (gastroesophageal reflux disease)   Depression   Hyponatremia   Protein-calorie malnutrition, severe (HCC)   Left-sided weakness: patient has worsening left-sided weakness.  CT head is negative for acute intracranial abnormalities.  Dr. Amada JupiterKirkpatrick of neurology was consulted, who thinks this is likely related to hypotension in the setting of her baseline R carotid occlusion.   -will place on tele bed for obs - treat hypotension as below - PT/OT - Continue home aspirin and Plavix  Hypotension: Etiology is not clear.  Blood pressure improved to 93/45 without specific treatment in ED.  Differential  diagnosis include dehydration, infection such as a UTI, adrenal insufficiency. Her husband states that pt newly started blood pressure medications (amlodipine on list) in the past  several days, which may have contributed to partially. -IV fluid: 1 L normal saline, followed by 100 cc/h - Follow-up urinalysis, urine culture, blood culture -Give 1 dose of Solu-Cortef 50 mg x 1 -Follow-up cortisol level pneumonia -hold amlodipine  Stroke (cerebrum) [facility]): with mild left sided weakness - on ASA and Plavix  Essential hypertension: -Hold amlodipine due to hypotension -IV hydralazine as needed  Chronic obstructive pulmonary disease (HCC): stable. - Spiriva inhaler, Dulera inhaler, as needed albuterol nebulizer, Singulair  Hypokalemia: K=2.4 on admission. - Repleted with total of 90 mEQ of KCl (IV 10 x 3 and 60 mEq by oral) - Check Mg level - Give 1 mg of magnesium sulfate;  GERD: -Protonix  Depression: -continue home Zoloft  Hyponatremia: Sodium 126 with mental status normal.  Differential diagnosis include decreasd oral intake and dehydration, SIADH, thyroid dysfunction and adrenal insufficiency. - Will check urine sodium, urine osmolality, serum osmolality. - check TSH - IVF: 1L NS in ED, will continue with IV normal saline at 100 mL/h - follow up by BMP   Abdominal pain: Etiology is not clear. -Check lipase -Follow-up CT abdomen/pelvis  Protein-calorie malnutrition, severe (HCC): -Nutrition consult  DVT ppx:  SQ Lovenox Code Status: Full code Family Communication: Kirkpatrick of neurology yes, patient's husband at bed side Disposition Plan:  Anticipate discharge back to previous home environment Consults called:  Dr. Amada Jupiter of neurology Admission status: Obs / tele     Date of Service 04/06/2018    Lorretta Harp [facility] Pager [phone]  If 7PM-7AM, please contact night-coverage [email or web address] Password [facility] 04/06/2018, 12:40 AM

## 2018-04-05 NOTE — ED Provider Notes (Signed)
[facility] EMERGENCY DEPARTMENT Provider Note   CSN: [id number] Arrival date & time: 04/05/18  2211   An emergency department physician performed an initial assessment on this suspected stroke patient at 2215.  History   Chief Complaint Chief Complaint  Patient presents with  . Code Stroke    HPI Pam Johnson is a 67 y.o. female.  67 year old female with prior history of COPD, hypertension, CVA, and occluded right carotid artery who presents as a Code stroke.  Patient arrived by EMS.  Patient reportedly became symptomatic just prior to arrival.  Patient's husband noticed that the patient appeared to be having increased weakness on the left side.  Patient has been notified EMS.  EMS reports the patient had a systolic blood pressure of 70 upon their initial arrival.  This blood pressure is improved without specific intervention.   Patient was met by Dr. Leonel Ramsay, neuro-hospitalist service.  Patient is well-known given her recent admission for ischemic stroke.  Patient reportedly has an chronically occluded right carotid artery.  Dr. Saralyn Pilar is suspicious that the dropping blood pressure resulted in decreased cerebral blood flow and resultant change in neurologic status. Patient has residual left sided deficit from her prior CVA. Upon my initial exam, she appears to have nearly returned to baseline.   Dr. Leonel Ramsay does not feel that patient requires acute Neuro intervention at this time.   The history is provided by the patient and medical records.  Neurologic Problem  This is a recurrent problem. The current episode started less than 1 hour ago. The problem occurs constantly. The problem has not changed since onset.Pertinent negatives include no chest pain, no abdominal pain, no headaches and no shortness of breath. Nothing aggravates the symptoms. Nothing relieves the symptoms.    Past Medical History:  Diagnosis Date  . Acid reflux   . COPD (chronic  obstructive pulmonary disease) ([place])   . Hypertension   . Sacral fracture ([place])   . Spinal stenosis     Patient Active Problem List   Diagnosis Date Noted  . Dysphagia, post-stroke   . Acute blood loss anemia   . Chronic obstructive pulmonary disease ([place])   . Chronic pain syndrome   . Acute ischemic right MCA stroke ([place]) 03/24/2018  . 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([place])   . Essential hypertension   . Stroke (cerebrum) ([place]) 03/22/2018  . 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 Pneumonia 10/18/2015    Past Surgical History:  Procedure Laterality Date  . ABDOMINAL HYSTERECTOMY    . CHOLECYSTECTOMY    . HERNIA REPAIR    . IR ANGIO INTRA EXTRACRAN SEL COM CAROTID INNOMINATE UNI L MOD SED  03/22/2018  . IR ANGIO VERTEBRAL SEL SUBCLAVIAN INNOMINATE UNI R MOD SED  03/22/2018  . IR CT HEAD LTD  03/22/2018  . IR PERCUTANEOUS ART THROMBECTOMY/INFUSION INTRACRANIAL INC DIAG ANGIO  03/22/2018  . NISSEN FUNDOPLICATION    . RADIOLOGY WITH ANESTHESIA N/A 03/22/2018   Procedure: IR WITH ANESTHESIA;  Surgeon: Luanne Bras, MD;  Location: [facility];  Service: Radiology;  Laterality: N/A;  . TOTAL HIP ARTHROPLASTY Right 11/2017     OB History   None      Home Medications    Prior to Admission medications   Medication Sig Start Date End Date Taking? Authorizing Provider  acetaminophen (TYLENOL) 325 MG tablet Take 1-2 tablets (325-650 mg total) by mouth every 6 (six) hours as needed for mild pain. 03/31/18   Bary Leriche, PA-C  albuterol (PROVENTIL HFA;VENTOLIN HFA) 108 (90 BASE) MCG/ACT inhaler Inhale 2 puffs into the lungs every 6 (six) hours as needed for wheezing or shortness of breath.    [provider]  amLODipine (NORVASC) 10 MG tablet Take 1 tablet (10 mg total) by mouth daily. 04/01/18   Love, Ivan Anchors, PA-C  aspirin 81 MG EC tablet Take 1 tablet (81 mg total) by mouth daily. 04/01/18   Love, Ivan Anchors, PA-C  clopidogrel (PLAVIX) 75 MG tablet Take 1 tablet (75 mg  total) by mouth daily. 04/01/18   Love, Ivan Anchors, PA-C  docusate sodium (COLACE) 100 MG capsule Take 1 capsule (100 mg total) by mouth 2 (two) times daily. 03/31/18   Love, Ivan Anchors, PA-C  fluticasone (FLONASE) 50 MCG/ACT nasal spray Place 2 sprays into both nostrils daily as needed for allergies or rhinitis.    [provider]  Fluticasone-Salmeterol (ADVAIR) 500-50 MCG/DOSE AEPB Inhale 1 puff into the lungs 2 (two) times daily.    [provider]  HYDROcodone-acetaminophen (NORCO) 7.5-325 MG tablet Take 1 tablet by mouth 2 (two) times daily as needed (breakthrough pain). 03/31/18   Love, Ivan Anchors, PA-C  hydrocortisone cream 1 % Apply 1 application topically 2 (two) times daily as needed for itching.    [provider]  Menthol-Methyl Salicylate (MUSCLE RUB) 10-15 % CREA Apply 1 application topically 4 (four) times daily. To knees 03/31/18   Love, Ivan Anchors, PA-C  metoCLOPramide (REGLAN) 5 MG tablet Take 5 mg by mouth 4 (four) times daily.    [provider]  mirtazapine (REMERON) 7.5 MG tablet Take 1 tablet (7.5 mg total) by mouth at bedtime. 03/31/18   Love, Ivan Anchors, PA-C  montelukast (SINGULAIR) 10 MG tablet Take 10 mg by mouth every morning.    [provider]  Multiple Vitamin (MULTIVITAMIN WITH MINERALS) TABS tablet Take 1 tablet by mouth daily.    [provider]  nicotine (NICODERM CQ - DOSED IN MG/24 HOURS) 14 mg/24hr patch Place 1 patch (14 mg total) onto the skin daily. 04/01/18   Love, Ivan Anchors, PA-C  nitroGLYCERIN (NITROSTAT) 0.4 MG SL tablet Place 0.4 mg under the tongue every 5 (five) minutes as needed for chest pain.    [provider]  olopatadine (PATANOL) 0.1 % ophthalmic solution Place 1 drop into both eyes 2 (two) times daily.    [provider]  oxyCODONE (OXYCONTIN) 10 mg 12 hr tablet Take 1 tablet (10 mg total) by mouth every 12 (twelve) hours. 03/31/18   Love, Ivan Anchors, PA-C  pantoprazole (PROTONIX) 40 MG tablet Take 1  tablet (40 mg total) by mouth daily. 04/01/18   Love, Ivan Anchors, PA-C  sertraline (ZOLOFT) 100 MG tablet Take 150 mg by mouth daily.    [provider]  tiotropium (SPIRIVA) 18 MCG inhalation capsule Place 18 mcg into inhaler and inhale daily.    [provider]  triamcinolone cream (KENALOG) 0.5 % Apply 1 application topically daily as needed (rash).    [provider]    Family History Family History  Problem Relation Age of Onset  . Hypertension Mother   . Hypertension Father     Social History Social History   Tobacco Use  . Smoking status: Current Every Day Smoker    Packs/day: 1.00    Types: Cigarettes  . Smokeless tobacco: Never Used  Substance Use Topics  . Alcohol use: Yes    Comment: occ  . Drug use: No  Allergies   Acyclovir and related; Ciprofloxacin; Ciprofloxacin; Penicillins; Promethazine; and Morphine   Review of Systems Review of Systems  Respiratory: Negative for shortness of breath.   Cardiovascular: Negative for chest pain.  Gastrointestinal: Negative for abdominal pain.  Neurological: Negative for headaches.  All other systems reviewed and are negative.    Physical Exam Updated Vital Signs BP 115/61   Pulse (!) 115   Resp (!) 23   SpO2 94%   Physical Exam  Constitutional: She is oriented to person, place, and time. She appears well-developed and well-nourished. No distress.  HENT:  Head: Normocephalic and atraumatic.  Mouth/Throat: Oropharynx is clear and moist.  Eyes: Pupils are equal, round, and reactive to light. Conjunctivae and EOM are normal.  Neck: Normal range of motion. Neck supple.  Cardiovascular: Normal rate, regular rhythm and normal heart sounds.  Pulmonary/Chest: Effort normal and breath sounds normal. No respiratory distress.  Abdominal: Soft. She exhibits no distension. There is no tenderness.  Musculoskeletal: Normal range of motion. She exhibits no edema or deformity.  Neurological: She is  alert and oriented to person, place, and time.  Alert Mild dysarthria Mild left arm weakness No facial droop    Skin: Skin is warm and dry.  Psychiatric: She has a normal mood and affect.  Nursing note and vitals reviewed.    ED Treatments / Results  Labs (all labs ordered are listed, but only abnormal results are displayed) Labs Reviewed  CBC - Abnormal; Notable for the following components:      Result Value   Hemoglobin 11.7 (*)    HCT 35.1 (*)    Platelets 569 (*)    All other components within normal limits  DIFFERENTIAL - Abnormal; Notable for the following components:   Monocytes Absolute 1.2 (*)    All other components within normal limits  I-STAT CHEM 8, ED - Abnormal; Notable for the following components:   Sodium 126 (*)    Potassium 2.4 (*)    Chloride 85 (*)    Glucose, Bld 109 (*)    Calcium, Ion 1.04 (*)    All other components within normal limits  PROTIME-INR  APTT  COMPREHENSIVE METABOLIC PANEL  I-STAT TROPONIN, ED  CBG MONITORING, ED    EKG None  Radiology Ct Head Code Stroke Wo Contrast  Result Date: 04/05/2018 CLINICAL DATA:  Code stroke. Left-sided weakness, facial droop and slurred speech. EXAM: CT HEAD WITHOUT CONTRAST TECHNIQUE: Contiguous axial images were obtained from the base of the skull through the vertex without intravenous contrast. COMPARISON:  Head CT 03/23/2018 FINDINGS: Brain: There is no mass, hemorrhage or extra-axial collection. The size and configuration of the ventricles and extra-axial CSF spaces are normal. There is an old right frontal lobe infarct. Old left lentiform nucleus small infarct. There is hypoattenuation of the periventricular white matter, most commonly indicating chronic ischemic microangiopathy. Vascular: No abnormal hyperdensity of the major intracranial arteries or dural venous sinuses. No intracranial atherosclerosis. Skull: The visualized skull base, calvarium and extracranial soft tissues are normal.  Sinuses/Orbits: No fluid levels or advanced mucosal thickening of the visualized paranasal sinuses. No mastoid or middle ear effusion. The orbits are normal. ASPECTS [facility] Stroke Program Early CT Score) - Ganglionic level infarction (caudate, lentiform nuclei, internal capsule, insula, M1-M3 cortex): 7 - Supraganglionic infarction (M4-M6 cortex): 3 Total score (0-10 with 10 being normal): 10 IMPRESSION: 1. No acute hemorrhage or mass effect. 2. 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 3. ASPECTS is 10. These  results were communicated to Dr. Roland Rack at 10:29 pm on 04/05/2018 by text page via the [facility] messaging system. Electronically Signed   By: Ulyses Jarred M.D.   On: 04/05/2018 22:32    Procedures Procedures (including critical care time) CRITICAL CARE Performed by: Valarie Merino   Total critical care time: 30 minutes  Critical care time was exclusive of separately billable procedures and treating other patients.  Critical care was necessary to treat or prevent imminent or life-threatening deterioration.  Critical care was time spent personally by me on the following activities: development of treatment plan with patient and/or surrogate as well as nursing, discussions with consultants, evaluation of patient's response to treatment, examination of patient, obtaining history from patient or surrogate, ordering and performing treatments and interventions, ordering and review of laboratory studies, ordering and review of radiographic studies, pulse oximetry and re-evaluation of patient's condition.   Medications Ordered in ED Medications  potassium chloride 10 mEq in 100 mL IVPB (has no administration in time range)     Initial Impression / Assessment and Plan / ED Course  I have reviewed the triage vital signs and the nursing notes.  Pertinent labs & imaging results that were available during my care of the patient were reviewed by me and  considered in my medical decision making (see chart for details).     MDM  Screen complete  Patient presenting as a code stroke for evaluation of acute change in mental status.  Of note, patient had market hypotension with EMS.  His hypotension resolved prior to arrival.  Patient's neurologic status improved once her blood pressure came up.  Patient does not meet criteria for acute neurologic intervention at this time (per Dr. Leonel Ramsay).  Patient will require admission to the hospitalist service for repletion of potassium and further work-up of transient episode of hypotension.  Hospitalist service is aware of case and will evaluate for admission.  Final Clinical Impressions(s) / ED Diagnoses   Final diagnoses:  Altered mental status, unspecified altered mental status type  Hypotension, unspecified hypotension type    ED Discharge Orders    None       Valarie Merino, MD 04/05/18 2323

## 2018-04-05 NOTE — ED Triage Notes (Signed)
Per [facility], pt from home. At 2045 pt became flaccid on left side with left sided facial droop and slurred speech. In route sx subsided just prior to arrival and pt back to baseline. Pt has hx of cva 2 weeks ago and was seen here. Has a carotid blockage. When ems took first BP pt's pressure was 70/40, then increased to 130/72 in route, Pulse 65, RR 16, CBG 149. Pt axox4. On plavix.

## 2018-04-05 NOTE — Consult Note (Signed)
Neurology Consultation Reason for Consult: Left-sided weakness Referring Physician: Harvel RicksMessick, P  CC: Left-sided weakness  History is obtained from: Patient  HPI: Pam Johnson is a 67 y.o. 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  She was given IV TPA and taken for IR intervention.  Unfortunately the carotid was not able to be revascularized.    Of note, she was initially hypotensive with that episode.  She improved greatly and was discharged to rehab with mild left-sided weakness.  She was doing okay until today around 8:45 PM at which point she had been sitting down and she was going to get up to go do something when her husband said "you look like you are having a stroke."  LKW: 8:45 PM tpa given?: no, recent stroke  ROS: A 14 point ROS was performed and is negative except as noted in the HPI.   Past Medical History:  Diagnosis Date  . Acid reflux   . COPD (chronic obstructive pulmonary disease) ([facility])   . Hypertension   . Sacral fracture ([facility])   . Spinal stenosis      Family History  Problem Relation Age of Onset  . Hypertension Mother   . Hypertension Father      Social History:  reports that she has been smoking cigarettes.  She has been smoking about 1.00 pack per day. She has never used smokeless tobacco. She reports that she drinks alcohol. She reports that she does not use drugs.   Exam: Current vital signs: BP 115/61   Pulse (!) 115   Resp (!) 23   SpO2 94%  Vital signs in last 24 hours: Pulse Rate:  [55-115] 115 (06/12 2252) Resp:  [16-23] 23 (06/12 2252) BP: (93-124)/(45-74) 115/61 (06/12 2252) SpO2:  [94 %-99 %] 94 % (06/12 2252)   Physical Exam  Constitutional: Appears well-developed and well-nourished.  Psych: Affect appropriate to situation Eyes: No scleral injection HENT: No OP obstrucion Head: Normocephalic.  Cardiovascular:  Normal rate and regular rhythm.  Respiratory: Effort normal, non-labored breathing GI: Soft.  No distension. There is no tenderness.  Skin: WDI  Neuro: Mental Status: Patient is awake, alert, oriented to person, place, month, year, and situation. Patient is able to give a clear and coherent history. No signs of aphasia or neglect Cranial Nerves: II: She has a dense left hemianopia pupils are equal, round, and reactive to light.   III,IV, VI: EOMI without ptosis or diploplia.  V: Facial sensation is symmetric to temperature VII: Facial movement is symmetric.  VIII: hearing is intact to voice X: Uvula elevates symmetrically XI: Shoulder shrug is symmetric. XII: tongue is midline without atrophy or fasciculations.  Motor: Tone is normal. Bulk is normal.  She has Sensory: Sensation is symmetric to light touch and temperature in the arms and legs.  She does not extinguish consistently, though does one time only since on the right. Cerebellar: She has discoordination consistent with weakness on the left, no impairment of right finger-nose-finger  I have reviewed labs in epic and the results pertinent to this consultation are: Chem-8 hyponatremia at 126, hypokalemia at 2.4  I have reviewed the images obtained: CT head-no clearly new findings  Impression: 67 year old female with recent right parietal occipital infarct likely due to hypertension the setting of carotid occlusion.  She is therefore not a candidate for IV TPA.  In any case, I do  not think that this is embolic or thrombotic stroke, but rather simply the result of hypotension with her baseline carotid occlusion.   Recommendations: 1) continue Plavix 2) avoid hypotension, further work-up of hypotension per the internal medicine team 3) PT, OT  4) stroke team will follow   Ritta Slot, MD [facility] [phone]  If 7pm- 7am, please page neurology on call as listed in AMION.

## 2018-04-06 ENCOUNTER — Other Ambulatory Visit: Payer: Self-pay

## 2018-04-06 ENCOUNTER — Observation Stay (HOSPITAL_COMMUNITY): Payer: Medicare Other

## 2018-04-06 ENCOUNTER — Encounter (HOSPITAL_COMMUNITY): Payer: Self-pay

## 2018-04-06 DIAGNOSIS — Z885 Allergy status to narcotic agent status: Secondary | ICD-10-CM | POA: Diagnosis not present

## 2018-04-06 DIAGNOSIS — Z9049 Acquired absence of other specified parts of digestive tract: Secondary | ICD-10-CM | POA: Diagnosis not present

## 2018-04-06 DIAGNOSIS — Z880 Allergy status to penicillin: Secondary | ICD-10-CM | POA: Diagnosis not present

## 2018-04-06 DIAGNOSIS — Z881 Allergy status to other antibiotic agents status: Secondary | ICD-10-CM | POA: Diagnosis not present

## 2018-04-06 DIAGNOSIS — I959 Hypotension, unspecified: Secondary | ICD-10-CM | POA: Diagnosis present

## 2018-04-06 DIAGNOSIS — I6521 Occlusion and stenosis of right carotid artery: Secondary | ICD-10-CM | POA: Diagnosis present

## 2018-04-06 DIAGNOSIS — Y92009 Unspecified place in unspecified non-institutional (private) residence as the place of occurrence of the external cause: Secondary | ICD-10-CM | POA: Diagnosis not present

## 2018-04-06 DIAGNOSIS — E440 Moderate protein-calorie malnutrition: Secondary | ICD-10-CM | POA: Diagnosis present

## 2018-04-06 DIAGNOSIS — Z5321 Procedure and treatment not carried out due to patient leaving prior to being seen by health care provider: Secondary | ICD-10-CM | POA: Diagnosis not present

## 2018-04-06 DIAGNOSIS — I700 Atherosclerosis of aorta: Secondary | ICD-10-CM | POA: Diagnosis present

## 2018-04-06 DIAGNOSIS — R4182 Altered mental status, unspecified: Secondary | ICD-10-CM | POA: Insufficient documentation

## 2018-04-06 DIAGNOSIS — Z681 Body mass index (BMI) 19 or less, adult: Secondary | ICD-10-CM | POA: Diagnosis not present

## 2018-04-06 DIAGNOSIS — I10 Essential (primary) hypertension: Secondary | ICD-10-CM | POA: Diagnosis present

## 2018-04-06 DIAGNOSIS — K219 Gastro-esophageal reflux disease without esophagitis: Secondary | ICD-10-CM | POA: Diagnosis present

## 2018-04-06 DIAGNOSIS — E871 Hypo-osmolality and hyponatremia: Secondary | ICD-10-CM

## 2018-04-06 DIAGNOSIS — I639 Cerebral infarction, unspecified: Secondary | ICD-10-CM | POA: Diagnosis not present

## 2018-04-06 DIAGNOSIS — Z96641 Presence of right artificial hip joint: Secondary | ICD-10-CM | POA: Diagnosis not present

## 2018-04-06 DIAGNOSIS — I69354 Hemiplegia and hemiparesis following cerebral infarction affecting left non-dominant side: Secondary | ICD-10-CM | POA: Diagnosis not present

## 2018-04-06 DIAGNOSIS — Z888 Allergy status to other drugs, medicaments and biological substances status: Secondary | ICD-10-CM | POA: Diagnosis not present

## 2018-04-06 DIAGNOSIS — Z8249 Family history of ischemic heart disease and other diseases of the circulatory system: Secondary | ICD-10-CM | POA: Diagnosis not present

## 2018-04-06 DIAGNOSIS — E876 Hypokalemia: Secondary | ICD-10-CM | POA: Diagnosis present

## 2018-04-06 DIAGNOSIS — J449 Chronic obstructive pulmonary disease, unspecified: Secondary | ICD-10-CM | POA: Diagnosis present

## 2018-04-06 DIAGNOSIS — F329 Major depressive disorder, single episode, unspecified: Secondary | ICD-10-CM | POA: Diagnosis present

## 2018-04-06 DIAGNOSIS — E43 Unspecified severe protein-calorie malnutrition: Secondary | ICD-10-CM | POA: Insufficient documentation

## 2018-04-06 DIAGNOSIS — R531 Weakness: Secondary | ICD-10-CM | POA: Diagnosis not present

## 2018-04-06 DIAGNOSIS — Z7982 Long term (current) use of aspirin: Secondary | ICD-10-CM | POA: Diagnosis not present

## 2018-04-06 DIAGNOSIS — Z90710 Acquired absence of both cervix and uterus: Secondary | ICD-10-CM | POA: Diagnosis not present

## 2018-04-06 DIAGNOSIS — T461X5A Adverse effect of calcium-channel blockers, initial encounter: Secondary | ICD-10-CM | POA: Diagnosis present

## 2018-04-06 DIAGNOSIS — F17210 Nicotine dependence, cigarettes, uncomplicated: Secondary | ICD-10-CM | POA: Diagnosis present

## 2018-04-06 DIAGNOSIS — I952 Hypotension due to drugs: Secondary | ICD-10-CM | POA: Diagnosis present

## 2018-04-06 LAB — CBC
HCT: 35.4 % — ABNORMAL LOW (ref 36.0–46.0)
Hemoglobin: 11.6 g/dL — ABNORMAL LOW (ref 12.0–15.0)
MCH: 29.4 pg (ref 26.0–34.0)
MCHC: 32.8 g/dL (ref 30.0–36.0)
MCV: 89.8 fL (ref 78.0–100.0)
PLATELETS: 555 10*3/uL — AB (ref 150–400)
RBC: 3.94 MIL/uL (ref 3.87–5.11)
RDW: 14.7 % (ref 11.5–15.5)
WBC: 8.6 10*3/uL (ref 4.0–10.5)

## 2018-04-06 LAB — BASIC METABOLIC PANEL
Anion gap: 9 (ref 5–15)
BUN: 7 mg/dL (ref 6–20)
CHLORIDE: 92 mmol/L — AB (ref 101–111)
CO2: 26 mmol/L (ref 22–32)
CREATININE: 0.66 mg/dL (ref 0.44–1.00)
Calcium: 8.4 mg/dL — ABNORMAL LOW (ref 8.9–10.3)
GFR calc Af Amer: >60 mL/min (ref >60)
Glucose, Bld: 112 mg/dL — ABNORMAL HIGH (ref 65–99)
Potassium: 3.6 mmol/L (ref 3.5–5.1)
SODIUM: 127 mmol/L — AB (ref 135–145)

## 2018-04-06 LAB — MAGNESIUM: MAGNESIUM: 2 mg/dL (ref 1.7–2.4)

## 2018-04-06 LAB — CORTISOL-AM, BLOOD

## 2018-04-06 LAB — OSMOLALITY, URINE: OSMOLALITY UR: 283 mosm/kg — AB (ref 300–900)

## 2018-04-06 LAB — HIV ANTIBODY (ROUTINE TESTING W REFLEX): HIV Screen 4th Generation wRfx: NONREACTIVE

## 2018-04-06 LAB — URINALYSIS, ROUTINE W REFLEX MICROSCOPIC
Bacteria, UA: NONE SEEN
Bilirubin Urine: NEGATIVE
GLUCOSE, UA: NEGATIVE mg/dL
Hgb urine dipstick: NEGATIVE
Ketones, ur: NEGATIVE mg/dL
NITRITE: NEGATIVE
PH: 6 (ref 5.0–8.0)
Protein, ur: NEGATIVE mg/dL
Specific Gravity, Urine: 1.011 (ref 1.005–1.030)

## 2018-04-06 LAB — LIPASE, BLOOD: Lipase: 22 U/L (ref 11–51)

## 2018-04-06 LAB — TSH: TSH: 1.484 u[IU]/mL (ref 0.350–4.500)

## 2018-04-06 LAB — OSMOLALITY: OSMOLALITY: 266 mosm/kg — AB (ref 275–295)

## 2018-04-06 LAB — SODIUM, URINE, RANDOM: SODIUM UR: 15 mmol/L

## 2018-04-06 MED ORDER — NICOTINE 14 MG/24HR TD PT24
14.0000 mg | MEDICATED_PATCH | Freq: Every day | TRANSDERMAL | Status: DC
  Administered 2018-04-06 – 2018-04-07 (×2): 14 mg via TRANSDERMAL
  Filled 2018-04-06 (×2): qty 1

## 2018-04-06 MED ORDER — ASPIRIN EC 81 MG PO TBEC
81.0000 mg | DELAYED_RELEASE_TABLET | Freq: Every day | ORAL | Status: DC
  Administered 2018-04-06 – 2018-04-07 (×2): 81 mg via ORAL
  Filled 2018-04-06 (×2): qty 1

## 2018-04-06 MED ORDER — HYDROCORTISONE 1 % EX CREA
1.0000 "application " | TOPICAL_CREAM | Freq: Two times a day (BID) | CUTANEOUS | Status: DC | PRN
  Administered 2018-04-06: 1 via TOPICAL

## 2018-04-06 MED ORDER — DOCUSATE SODIUM 100 MG PO CAPS
100.0000 mg | ORAL_CAPSULE | Freq: Two times a day (BID) | ORAL | Status: DC
  Administered 2018-04-06 – 2018-04-07 (×3): 100 mg via ORAL
  Filled 2018-04-06 (×3): qty 1

## 2018-04-06 MED ORDER — HYDROCODONE-ACETAMINOPHEN 7.5-325 MG PO TABS
1.0000 | ORAL_TABLET | Freq: Two times a day (BID) | ORAL | Status: DC | PRN
  Administered 2018-04-06 (×4): 1 via ORAL
  Filled 2018-04-06 (×5): qty 1

## 2018-04-06 MED ORDER — ENOXAPARIN SODIUM 40 MG/0.4ML ~~LOC~~ SOLN
40.0000 mg | Freq: Every day | SUBCUTANEOUS | Status: DC
  Administered 2018-04-06 – 2018-04-07 (×2): 40 mg via SUBCUTANEOUS
  Filled 2018-04-06 (×2): qty 0.4

## 2018-04-06 MED ORDER — ONDANSETRON HCL 4 MG/2ML IJ SOLN
4.0000 mg | Freq: Four times a day (QID) | INTRAMUSCULAR | Status: DC | PRN
  Administered 2018-04-07: 4 mg via INTRAVENOUS
  Filled 2018-04-06: qty 2

## 2018-04-06 MED ORDER — HYDRALAZINE HCL 20 MG/ML IJ SOLN: 5.0000 mg | INTRAMUSCULAR | Status: DC | PRN

## 2018-04-06 MED ORDER — ALBUTEROL SULFATE (2.5 MG/3ML) 0.083% IN NEBU
2.5000 mg | INHALATION_SOLUTION | RESPIRATORY_TRACT | Status: DC | PRN
Start: 2018-04-06 — End: 2018-04-07

## 2018-04-06 MED ORDER — OLOPATADINE HCL 0.1 % OP SOLN
1.0000 [drp] | Freq: Two times a day (BID) | OPHTHALMIC | Status: DC
  Filled 2018-04-06: qty 5

## 2018-04-06 MED ORDER — PANTOPRAZOLE SODIUM 40 MG PO TBEC
40.0000 mg | DELAYED_RELEASE_TABLET | Freq: Every day | ORAL | Status: DC
  Administered 2018-04-06 – 2018-04-07 (×2): 40 mg via ORAL
  Filled 2018-04-06 (×2): qty 1

## 2018-04-06 MED ORDER — NITROGLYCERIN 0.4 MG SL SUBL: 0.4000 mg | SUBLINGUAL_TABLET | SUBLINGUAL | Status: DC | PRN

## 2018-04-06 MED ORDER — FLUTICASONE PROPIONATE 50 MCG/ACT NA SUSP: 2.0000 | Freq: Every day | NASAL | Status: DC | PRN

## 2018-04-06 MED ORDER — MIRTAZAPINE 7.5 MG PO TABS
7.5000 mg | ORAL_TABLET | Freq: Every day | ORAL | Status: DC
  Administered 2018-04-06 (×2): 7.5 mg via ORAL
  Filled 2018-04-06 (×3): qty 1

## 2018-04-06 MED ORDER — MUSCLE RUB 10-15 % EX CREA
1.0000 "application " | TOPICAL_CREAM | Freq: Three times a day (TID) | CUTANEOUS | Status: DC
  Administered 2018-04-06 – 2018-04-07 (×3): 1 via TOPICAL
  Filled 2018-04-06: qty 85

## 2018-04-06 MED ORDER — ZOLPIDEM TARTRATE 5 MG PO TABS: 5.0000 mg | ORAL_TABLET | Freq: Every evening | ORAL | Status: DC | PRN

## 2018-04-06 MED ORDER — MONTELUKAST SODIUM 10 MG PO TABS
10.0000 mg | ORAL_TABLET | Freq: Every day | ORAL | Status: DC
  Administered 2018-04-06 – 2018-04-07 (×2): 10 mg via ORAL
  Filled 2018-04-06 (×2): qty 1

## 2018-04-06 MED ORDER — POLYETHYLENE GLYCOL 3350 17 G PO PACK: 17.0000 g | PACK | Freq: Every day | ORAL | Status: DC | PRN

## 2018-04-06 MED ORDER — SERTRALINE HCL 50 MG PO TABS
150.0000 mg | ORAL_TABLET | Freq: Every day | ORAL | Status: DC
  Administered 2018-04-06 – 2018-04-07 (×2): 150 mg via ORAL
  Filled 2018-04-06 (×2): qty 1

## 2018-04-06 MED ORDER — HYDROCORTISONE NA SUCCINATE PF 100 MG IJ SOLR
50.0000 mg | Freq: Once | INTRAMUSCULAR | Status: AC
Start: 2018-04-06 — End: 2018-04-06
  Administered 2018-04-06: 50 mg via INTRAVENOUS
  Filled 2018-04-06: qty 2

## 2018-04-06 MED ORDER — ADULT MULTIVITAMIN W/MINERALS CH
1.0000 | ORAL_TABLET | Freq: Every day | ORAL | Status: DC
  Administered 2018-04-06 – 2018-04-07 (×2): 1 via ORAL
  Filled 2018-04-06 (×2): qty 1

## 2018-04-06 MED ORDER — OXYCODONE HCL ER 10 MG PO T12A: 10.0000 mg | EXTENDED_RELEASE_TABLET | Freq: Two times a day (BID) | ORAL | Status: DC

## 2018-04-06 MED ORDER — IOHEXOL 300 MG/ML  SOLN
100.0000 mL | Freq: Once | INTRAMUSCULAR | Status: AC | PRN
  Administered 2018-04-06: 100 mL via INTRAVENOUS

## 2018-04-06 MED ORDER — MOMETASONE FURO-FORMOTEROL FUM 200-5 MCG/ACT IN AERO
2.0000 | INHALATION_SPRAY | Freq: Two times a day (BID) | RESPIRATORY_TRACT | Status: DC
  Administered 2018-04-06 – 2018-04-07 (×3): 2 via RESPIRATORY_TRACT
  Filled 2018-04-06: qty 8.8

## 2018-04-06 MED ORDER — ONDANSETRON HCL 4 MG PO TABS
4.0000 mg | ORAL_TABLET | Freq: Four times a day (QID) | ORAL | Status: DC | PRN
  Administered 2018-04-06 – 2018-04-07 (×2): 4 mg via ORAL
  Filled 2018-04-06 (×3): qty 1

## 2018-04-06 MED ORDER — POTASSIUM CHLORIDE 10 MEQ/100ML IV SOLN
10.0000 meq | Freq: Once | INTRAVENOUS | Status: AC
  Administered 2018-04-06: 10 meq via INTRAVENOUS
  Filled 2018-04-06: qty 100

## 2018-04-06 MED ORDER — TRIAMCINOLONE ACETONIDE 0.5 % EX CREA: 1.0000 "application " | TOPICAL_CREAM | Freq: Every day | CUTANEOUS | Status: DC | PRN

## 2018-04-06 MED ORDER — TIOTROPIUM BROMIDE MONOHYDRATE 18 MCG IN CAPS
18.0000 ug | ORAL_CAPSULE | Freq: Every day | RESPIRATORY_TRACT | Status: DC
  Administered 2018-04-06 – 2018-04-07 (×2): 18 ug via RESPIRATORY_TRACT
  Filled 2018-04-06: qty 5

## 2018-04-06 MED ORDER — OXYCODONE HCL ER 10 MG PO T12A
10.0000 mg | EXTENDED_RELEASE_TABLET | Freq: Two times a day (BID) | ORAL | Status: DC
  Filled 2018-04-06: qty 1

## 2018-04-06 MED ORDER — CLOPIDOGREL BISULFATE 75 MG PO TABS
75.0000 mg | ORAL_TABLET | Freq: Every day | ORAL | Status: DC
  Administered 2018-04-06 – 2018-04-07 (×2): 75 mg via ORAL
  Filled 2018-04-06 (×2): qty 1

## 2018-04-06 MED ORDER — DM-GUAIFENESIN ER 30-600 MG PO TB12
1.0000 | ORAL_TABLET | Freq: Two times a day (BID) | ORAL | Status: DC | PRN
  Filled 2018-04-06: qty 1

## 2018-04-06 MED ORDER — ACETAMINOPHEN 325 MG PO TABS
325.0000 mg | ORAL_TABLET | Freq: Four times a day (QID) | ORAL | Status: DC | PRN
  Administered 2018-04-06: 325 mg via ORAL
  Filled 2018-04-06: qty 1

## 2018-04-06 NOTE — Progress Notes (Signed)
Patient just came back from CT 0330

## 2018-04-06 NOTE — Care Management Note (Signed)
Case Management Note  Patient Details  Name: Pam Johnson MRN: [id number] Date of Birth: 09/30/1951  Subjective/Objective:     Pt in with lt sided weakness. She is from home with spouse.               Action/Plan: PT recommending HH services. Awaiting OT eval. CM following for d/c needs, physician orders.   Expected Discharge Date:                  Expected Discharge Plan:  Home w Home Health Services  In-House Referral:     Discharge planning Services  CM Consult  Post Acute Care Choice:    Choice offered to:     DME Arranged:    DME Agency:     HH Arranged:    HH Agency:     Status of Service:  In process, will continue to follow  If discussed at Long Length of Stay Meetings, dates discussed:    Additional Comments:  Kermit BaloKelli F Rodd Heft, RN 04/06/2018, 11:15 AM

## 2018-04-06 NOTE — Progress Notes (Signed)
PROGRESS NOTE  Pam RogerConstance M Johnson ZOX:[id number] DOB: 08/27/1951 DOA: 04/05/2018 PCP: Patient, No Pcp Per  Brief Narrative: 67 year old woman PMH carotid occlusion, hospitalized May for right parietal occipital infarct treated with IV TPA.  Carotid could not be revascularized.  Subsequently discharged to inpatient rehab.  Presented with left-sided weakness.  Reportedly hypotensive prior to presentation.  Recently started on amlodipine.  Assessment/Plan Left-sided weakness, thought secondary to hypotension with baseline carotid occlusion.  Neurology recommended continue Plavix, avoid hypotension.  Head negative. --Continue Plavix, aspirin.  Follow-up neurology recommendations. --Physical therapy is recommended home health PT  Hypotension reported prior to presentation.  Systolic in the 90s on presentation.  Likely related to newly started medication amlodipine. --Resolved.  Hold amlodipine.  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 --continue aspirin, Plavix  Hyponatremia with mild nausea.  Cortisol not low.  TSH within normal limits. --Likely secondary to voluminous water intake with no salt intake.  Sodium and chloride improving.  Essential hypertension recently started on amlodipine. --Hold amlodipine as above  Abdominal pain.  Resolved.  CT abdomen pelvis negative.  No further evaluation suggested  Aortic atherosclerosis.   Improving.  Continue treatment for hyponatremia.  Likely home next 1-2 days as sodium response.  DVT prophylaxis: enoxaparin Code Status: full Family Communication: son at bedside Disposition Plan: home with HHPT    Pam Sacksaniel Kyros Salzwedel, MD  [facility] Direct contact: [phone] --Via amion app OR  --[email or web address]; password [email or web address]  7PM-7AM contact night coverage as above 04/06/2018, 2:17 PM  LOS: 0 days   Consultants:  Neurology  Procedures:  none  Antimicrobials:  none  Interval history/Subjective: Feels better today.   Still nauseous.  Headache.  No new neurologic symptoms.  Objective: Vitals:  Vitals:   04/06/18 1115 04/06/18 1200  BP:    Pulse:    Resp:    Temp:  98.4 F (36.9 C)  SpO2: 98%     Exam:  Constitutional:  . Appears calm and comfortable ENMT:  . grossly normal hearing  . Lips appear normal Respiratory:  . CTA bilaterally, no w/r/r.  . Respiratory effort normal.  Cardiovascular:  . RRR, no m/r/g . No LE extremity edema   Abdomen:  . soft Psychiatric:  . Mental status o Mood, affect appropriate  I have personally reviewed the following:   Labs:  Sodium 127, potassium 3.6, chloride 92 trending up, remainder BMP unremarkable.  Magnesium within normal limits.  CBC unremarkable.  Cortisol greater than 112  TSH within normal limits otherwise  Imaging studies:  CT abdomen pelvis no acute abnormality.  Aortic atherosclerosis.  CT head no acute hemorrhage or mass-effect.  Medical tests:  EKG sinus rhythm, no acute changes  Review and summation of old records:  Hospitalized 5/29-5/31 for right hemispheric TIA treated with IV TPA with excellent recovery with subacute right parietal occipital infarct secondary to right ICA occlusion.  Defer to inpatient rehab on aspirin 81 mg daily, Plavix 75 mg daily  Inpatient rehab hospitalization 5/31-6/8  Echo 5/31 LVEF 50-55%.  Normal diastolic function.  Scheduled Meds: . aspirin EC  81 mg Oral Daily  . clopidogrel  75 mg Oral Daily  . docusate sodium  100 mg Oral BID  . enoxaparin (LOVENOX) injection  40 mg Subcutaneous Daily  . mirtazapine  7.5 mg Oral QHS  . mometasone-formoterol  2 puff Inhalation BID  . montelukast  10 mg Oral Daily  . multivitamin with minerals  1 tablet Oral Daily  . MUSCLE RUB  1 application Topical TID  AC & HS  . nicotine  14 mg Transdermal Daily  . olopatadine  1 drop Both Eyes BID  . [START ON 04/07/2018] oxyCODONE  10 mg Oral Q12H  . pantoprazole  40 mg Oral Daily  . sertraline  150 mg  Oral Daily  . tiotropium  18 mcg Inhalation Daily   Continuous Infusions: . sodium chloride 1,000 mL (04/06/18 0717)    Principal Problem:   Hyponatremia Active Problems:   Stroke (cerebrum) ([facility])   Essential hypertension   Depression   Left-sided weakness   LOS: 0 days

## 2018-04-06 NOTE — Evaluation (Signed)
Occupational Therapy Evaluation Patient Details Name: Pam Johnson MRN: [id number] DOB: Apr 17, 1951 Today's Date: 04/06/2018    History of Present Illness Patient is a 67 y/o female presenting to the ED on 04/05/18 with worsening L sided weakness, hypotension, and abdominal pain. Of note, paitent with prior CVA ~2 weeks prior to this admission with with occluded R carotid. Head CT negative for acute intracranial abnormalities. Patient with a PMH significant for  hypertension, COPD, R carotid occlusion, stroke with mild left-sided weakness,,GERD, spinal stenosis, depression.    Clinical Impression   Pt with decline in function and safety with ADLs and ADL mobility with decreased balance, endurance, safety awareness/cognition and L UE coordination. Patient recently returning home with husband after short CIR stay due to  CVA. Reports she was Mod I with rollator, with ability to perform ADLs with Sup -  Mod I. Today patient requiring Min guard assist for LB ADLs  And transfers/mobility for safety as well as max verbal cues for patient safety as she is impulsive during tasks/activity. Pt would benefit from acute OT services to address impairments to maximize level of function and safety     Follow Up Recommendations  No OT follow up;Supervision/Assistance - 24 hour    Equipment Recommendations  None recommended by OT    Recommendations for Other Services       Precautions / Restrictions Precautions Precautions: Fall Restrictions Weight Bearing Restrictions: No      Mobility Bed Mobility Overal bed mobility: Needs Assistance Bed Mobility: Sit to Supine     Supine to sit: Supervision Sit to supine: Supervision   General bed mobility comments: for safety and line management  Transfers Overall transfer level: Needs assistance Equipment used: Rolling walker (2 wheeled) Transfers: Sit to/from Stand Sit to Stand: Min guard         General transfer comment: for safety and  immediate standing balance, pt impulsive leaving RW at sink after grooming/hygiene tasks    Balance Overall balance assessment: Needs assistance Sitting-balance support: No upper extremity supported;Feet supported Sitting balance-Leahy Scale: Fair Sitting balance - Comments: able to sit EOB without assist   Standing balance support: Bilateral upper extremity supported;During functional activity Standing balance-Leahy Scale: Fair Standing balance comment: able to stand at sink to wash hands, but relies on RW for dynamic balance                           ADL either performed or assessed with clinical judgement   ADL Overall ADL's : Needs assistance/impaired Eating/Feeding: Supervision/ safety;Set up;Sitting   Grooming: Wash/dry hands;Brushing hair;Wash/dry face;Min guard;Standing   Upper Body Bathing: Set up;Supervision/ safety;Sitting   Lower Body Bathing: Min guard;Sit to/from stand   Upper Body Dressing : Set up;Supervision/safety;Sitting   Lower Body Dressing: Sit to/from stand;Min guard   Toilet Transfer: Ambulation;Comfort height toilet;RW;Grab bars;Min guard;Cueing for safety   Toileting- Clothing Manipulation and Hygiene: Sit to/from stand;Min guard;Cueing for safety   Tub/ Shower Transfer: Min guard;Ambulation;Rolling walker;Grab bars;3 in 1   Functional mobility during ADLs: Cueing for safety;Rolling walker;Min guard General ADL Comments: pt with Poor safety awareness, very impulsive leaving RW at sink after grooming       Vision Baseline Vision/History: Wears glasses Wears Glasses: At all times Patient Visual Report: No change from baseline       Perception     Praxis      Pertinent Vitals/Pain Pain Assessment: 0-10 Pain Score: 4  Faces Pain Scale: Hurts  little more Pain Location: low back Pain Descriptors / Indicators: Aching;Grimacing Pain Intervention(s): Limited activity within patient's tolerance;Monitored during session     Hand  Dominance Left   Extremity/Trunk Assessment Upper Extremity Assessment Upper Extremity Assessment: Generalized weakness;RUE deficits/detail RUE Deficits / Details: coordination impaired to grip/grasp ADL items, with clothing RUE Sensation: WNL RUE Coordination: decreased fine motor   Lower Extremity Assessment Lower Extremity Assessment: Defer to PT evaluation   Cervical / Trunk Assessment Cervical / Trunk Assessment: Kyphotic   Communication Communication Communication: No difficulties   Cognition Arousal/Alertness: Awake/alert Behavior During Therapy: Impulsive Overall Cognitive Status: Impaired/Different from baseline Area of Impairment: Following commands;Safety/judgement;Problem solving                       Following Commands: Follows one step commands consistently;Follows one step commands with increased time Safety/Judgement: Decreased awareness of safety;Decreased awareness of deficits   Problem Solving: Requires verbal cues;Requires tactile cues General Comments: patient impulsive with mobility with reduced safety awareness   General Comments       Exercises     Shoulder Instructions      Home Living Family/patient expects to be discharged to:: Private residence Living Arrangements: Spouse/significant other Available Help at Discharge: Family;Available 24 hours/day Type of Home: House Home Access: Stairs to enter [employer] of Steps: 1 threshold step Entrance Stairs-Rails: None Home Layout: Two level;Able to live on main level with bedroom/bathroom Alternate Level Stairs-Number of Steps: stays on main   Bathroom Shower/Tub: Tub/shower unit   [employer]: Standard Bathroom Accessibility: Yes How Accessible: Accessible via wheelchair;Accessible via walker Home Equipment: Walker - 4 wheels;Bedside commode;Shower seat;Cane - single point      Lives With: Spouse    Prior Functioning/Environment Level of Independence: Needs  assistance  Gait / Transfers Assistance Needed: rollator ADL's / Homemaking Assistance Needed: spouse makes meals and does housecleaning; pt able to perform ADLs with Sup to Mod I   Comments: used to enjoy cooking        OT Problem List: Decreased strength;Decreased activity tolerance;Impaired balance (sitting and/or standing);Decreased coordination;Decreased cognition;Decreased safety awareness;Decreased knowledge of use of DME or AE;Decreased knowledge of precautions;Impaired UE functional use;Pain      OT Treatment/Interventions: Self-care/ADL training;Therapeutic exercise;Neuromuscular education;DME and/or AE instruction;Therapeutic activities;Cognitive remediation/compensation;Patient/family education;Balance training    OT Goals(Current goals can be found in the care plan section) Acute Rehab OT Goals Patient Stated Goal: return home OT Goal Formulation: With patient Time For Goal Achievement: 04/20/18 Potential to Achieve Goals: Good ADL Goals Pt Will Perform Grooming: with supervision;with set-up;standing Pt Will Perform Upper Body Bathing: with set-up;sitting Pt Will Perform Lower Body Bathing: with supervision;sit to/from stand Pt Will Perform Upper Body Dressing: with set-up Pt Will Perform Lower Body Dressing: with supervision;sit to/from stand Pt Will Transfer to Toilet: with supervision;ambulating;regular height toilet;grab bars Pt Will Perform Toileting - Clothing Manipulation and hygiene: with supervision;sit to/from stand Pt Will Perform Tub/Shower Transfer: with supervision;ambulating;grab bars;shower seat;3 in 1 Additional ADL Goal #1: Pt will participate in L UE coordination exercises to increase functional use  OT Frequency: Min 2X/week   Barriers to D/C:    no barriers       Co-evaluation              AM-PAC PT "6 Clicks" Daily Activity     Outcome Measure Help from another person eating meals?: None Help from another person taking care of personal  grooming?: A Little Help from another person toileting, which includes  using toliet, bedpan, or urinal?: A Little Help from another person bathing (including washing, rinsing, drying)?: A Little Help from another person to put on and taking off regular upper body clothing?: A Little Help from another person to put on and taking off regular lower body clothing?: A Little 6 Click Score: 19   End of Session Equipment Utilized During Treatment: Gait belt;Rolling walker  Activity Tolerance: Patient tolerated treatment well Patient left: in bed;with call bell/phone within reach;with bed alarm set;with family/visitor present  OT Visit Diagnosis: Unsteadiness on feet (R26.81);Other abnormalities of gait and mobility (R26.89);History of falling (Z91.81);Muscle weakness (generalized) (M62.81);Other symptoms and signs involving cognitive function;Other symptoms and signs involving the nervous system (R29.898);Pain Hemiplegia - Right/Left: Left Hemiplegia - dominant/non-dominant: Dominant                Time: 1005-1031 OT Time Calculation (min): 26 min Charges:  OT General Charges $OT Visit: 1 Visit OT Evaluation $OT Eval Moderate Complexity: 1 Mod OT Treatments $Therapeutic Activity: 8-22 mins G-Codes: OT G-codes **NOT FOR INPATIENT CLASS** Functional Assessment Tool Used: AM-PAC 6 Clicks Daily Activity     Galen ManilaSpencer, Hazley Dezeeuw Jeanette 04/06/2018, 12:55 PM

## 2018-04-06 NOTE — Progress Notes (Signed)
Physical Therapy Evaluation Patient Details Name: Pam Johnson MRN: [id number] DOB: 01/16/1951 Today's Date: 04/06/2018   History of Present Illness  Patient is a 67 y/o female presenting to the ED on 04/05/18 with worsening L sided weakness, hypotension, and abdominal pain. Of note, paitent with prior CVA ~2 weeks prior to this admission with with occluded R carotid. Head CT negative for acute intracranial abnormalities. Patient with a PMH significant for  hypertension, COPD, R carotid occlusion, stroke with mild left-sided weakness,,GERD, spinal stenosis, depression.   Clinical Impression  Mrs. Pam Johnson is a very pleasant 67 y/o female presenting with the above listed diagnosis. Patient recently returning home with husband after short CIR stay s/p CVA. Reports she was Mod I with rollator, with ability to perform ADLs with SUP to Mod I. Today patient requiring Min guard assist for transfers and mobility for safety as well as heavy verbal cues for patient safety as she is quite impulsive with activity. PT to follow acutely to maximize safety and mobility prior to d/c.     Follow Up Recommendations Home health PT;Supervision/Assistance - 24 hour    Equipment Recommendations  None recommended by PT    Recommendations for Other Services       Precautions / Restrictions Precautions Precautions: Fall Restrictions Weight Bearing Restrictions: No      Mobility  Bed Mobility Overal bed mobility: Needs Assistance Bed Mobility: Supine to Sit     Supine to sit: Supervision     General bed mobility comments: for safety and line management  Transfers Overall transfer level: Needs assistance Equipment used: Rolling walker (2 wheeled) Transfers: Sit to/from Stand Sit to Stand: Min guard         General transfer comment: for safety and immediate standing balance  Ambulation/Gait Ambulation/Gait assistance: Min guard Ambulation Distance (Feet): 80 Feet Assistive device: Rolling  walker (2 wheeled) Gait Pattern/deviations: Step-through pattern;Decreased stride length;Drifts right/left;Trunk flexed Gait velocity: decreased   General Gait Details: requires VC for RW management as patient tends to pick up device; VC for safe turning  Stairs            Wheelchair Mobility    Modified Rankin (Stroke Patients Only) Modified Rankin (Stroke Patients Only) Pre-Morbid Rankin Score: Moderate disability Modified Rankin: Moderately severe disability     Balance Overall balance assessment: Needs assistance Sitting-balance support: No upper extremity supported;Feet supported Sitting balance-Leahy Scale: Fair Sitting balance - Comments: able to sit EOB without assist   Standing balance support: Bilateral upper extremity supported;During functional activity Standing balance-Leahy Scale: Fair Standing balance comment: able to stand at sink to wash hands, but relies on RW for dynamic balance                             Pertinent Vitals/Pain Pain Assessment: Faces Faces Pain Scale: Hurts little more Pain Location: low back "I've got a bad sciatic nerve" Pain Descriptors / Indicators: Aching;Grimacing Pain Intervention(s): Limited activity within patient's tolerance;Monitored during session;Repositioned    Home Living Family/patient expects to be discharged to:: Private residence Living Arrangements: Spouse/significant other Available Help at Discharge: Family;Available 24 hours/day Type of Home: House Home Access: Stairs to enter Entrance Stairs-Rails: None Entrance Stairs-Number of Steps: 1 threshold step Home Layout: Two level;Able to live on main level with bedroom/bathroom Home Equipment: Walker - 4 wheels;Bedside commode;Shower seat;Cane - single point      Prior Function Level of Independence: Needs assistance   Gait / Transfers  Assistance Needed: rollator  ADL's / Homemaking Assistance Needed: spouse makes meals and does housecleaning;  apteint able to perform ADLs with SUP to Mod I        Hand Dominance   Dominant Hand: Left    Extremity/Trunk Assessment        Lower Extremity Assessment Lower Extremity Assessment: Generalized weakness    Cervical / Trunk Assessment Cervical / Trunk Assessment: Kyphotic  Communication   Communication: No difficulties  Cognition Arousal/Alertness: Awake/alert Behavior During Therapy: Impulsive Overall Cognitive Status: Impaired/Different from baseline Area of Impairment: Following commands;Safety/judgement;Problem solving                       Following Commands: Follows one step commands consistently;Follows one step commands with increased time Safety/Judgement: Decreased awareness of safety;Decreased awareness of deficits   Problem Solving: Requires verbal cues;Requires tactile cues General Comments: patient impulsive with mobility with reduced safety awareness      General Comments      Exercises     Assessment/Plan    PT Assessment Patient needs continued PT services  PT Problem List Decreased balance;Decreased knowledge of use of DME;Decreased safety awareness;Decreased activity tolerance;Decreased cognition;Decreased mobility;Decreased strength;Decreased knowledge of precautions       PT Treatment Interventions DME instruction;Functional mobility training;Balance training;Patient/family education;Gait training;Therapeutic activities;Neuromuscular re-education;Stair training;Therapeutic exercise    PT Goals (Current goals can be found in the Care Plan section)  Acute Rehab PT Goals Patient Stated Goal: return home PT Goal Formulation: With patient Time For Goal Achievement: 04/20/18 Potential to Achieve Goals: Good    Frequency Min 4X/week   Barriers to discharge        Co-evaluation               AM-PAC PT "6 Clicks" Daily Activity  Outcome Measure Difficulty turning over in bed (including adjusting bedclothes, sheets and  blankets)?: A Little Difficulty moving from lying on back to sitting on the side of the bed? : A Little Difficulty sitting down on and standing up from a chair with arms (e.g., wheelchair, bedside commode, etc,.)?: Unable Help needed moving to and from a bed to chair (including a wheelchair)?: A Little Help needed walking in hospital room?: A Little Help needed climbing 3-5 steps with a railing? : A Little 6 Click Score: 16    End of Session Equipment Utilized During Treatment: Gait belt Activity Tolerance: Patient tolerated treatment well Patient left: in bed;with call bell/phone within reach;with family/visitor present Nurse Communication: Mobility status PT Visit Diagnosis: Unsteadiness on feet (R26.81);Other abnormalities of gait and mobility (R26.89);Other symptoms and signs involving the nervous system (U04.540)    Time: 9811-9147 PT Time Calculation (min) (ACUTE ONLY): 24 min   Charges:   PT Evaluation $PT Eval Moderate Complexity: 1 Mod     PT G Codes:        Kipp Laurence, PT, DPT 04/06/18 10:01 AM

## 2018-04-06 NOTE — Progress Notes (Signed)
Patient went down for abdominal CT 0235

## 2018-04-06 NOTE — Progress Notes (Addendum)
Stroke Team Progress Note  HISTORY Pam Johnson is a 67 y.o. 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  She was given IV TPA and taken for IR intervention.  Unfortunately the carotid was not able to be revascularized.   Of note, she was initially hypotensive with that episode.  She improved greatly and was discharged to rehab with mild left-sided weakness. She was doing okay until today around 8:45 PM at which point she had been sitting down and she was going to get up to go do something when her husband said "you look like you are having a stroke."  On admission to the ED patient was noted to be hypotensive with SBP in the 90s.   SUBJECTIVE Patient seen at bedside with family at the bedside.  She admits to feeling much better since being discharged from the rehab last  Saturday.  Since then she has been complaining of persistent dizziness which she attributed to her blood pressure being low and has since stopped her amlodipine and HCTZ.  Family admits to patient often bumping into objects due to her left hemianopsia, but denies recent falls where she hits her head.  Patient states symptoms have since resolved and she is back to her baseline mentation and deficits.  OBJECTIVE Most recent Vital Signs: Vitals:   04/06/18 0332 04/06/18 0530 04/06/18 0800 04/06/18 1115  BP: (!) 102/57 (!) 98/52    Pulse: 62 63    Resp: 18 18    Temp: 97.6 F (36.4 C) (!) 97 F (36.1 C) 97.7 F (36.5 C)   TempSrc: Oral Oral    SpO2: 97%   98%  Weight:      Height:       CBG (last 3)  No results for input(s): GLUCAP in the last 72 hours.  IV Fluid Intake:   . sodium chloride 1,000 mL (04/06/18 0717)    MEDICATIONS  . aspirin EC  81 mg Oral Daily  . clopidogrel  75 mg Oral Daily  . docusate sodium  100 mg Oral BID  . enoxaparin (LOVENOX) injection  40 mg Subcutaneous Daily  .  mirtazapine  7.5 mg Oral QHS  . mometasone-formoterol  2 puff Inhalation BID  . montelukast  10 mg Oral Daily  . multivitamin with minerals  1 tablet Oral Daily  . MUSCLE RUB  1 application Topical TID AC & HS  . nicotine  14 mg Transdermal Daily  . olopatadine  1 drop Both Eyes BID  . [START ON 04/07/2018] oxyCODONE  10 mg Oral Q12H  . pantoprazole  40 mg Oral Daily  . sertraline  150 mg Oral Daily  . tiotropium  18 mcg Inhalation Daily   PRN:  acetaminophen, albuterol, dextromethorphan-guaiFENesin, fluticasone, hydrALAZINE, HYDROcodone-acetaminophen, hydrocortisone cream, nitroGLYCERIN, ondansetron **OR** ondansetron (ZOFRAN) IV, polyethylene glycol, triamcinolone cream, zolpidem  Diet:   Diet Order           Diet Heart Room service appropriate? Yes; Fluid consistency: Thin  Diet effective now         Activity:  Bathroom privileges DVT Prophylaxis:  Lovenox  CLINICALLY SIGNIFICANT STUDIES Basic Metabolic Panel:  Recent Labs  Lab 04/05/18 2215 04/05/18 2221 04/06/18 0218  NA 127* 126* 127*  K 2.4* 2.4* 3.6  CL 87* 85* 92*  CO2 27  --  26  GLUCOSE 114* 109* 112*  BUN 8 8 7  CREATININE 0.86 0.80 0.66  CALCIUM 9.0  --  8.4*  MG  --   --  2.0   Liver Function Tests:  Recent Labs  Lab 04/05/18 2215  AST 24  ALT 14  ALKPHOS 74  BILITOT 0.4  PROT 6.9  ALBUMIN 3.5   CBC:  Recent Labs  Lab 04/05/18 2215 04/05/18 2221 04/06/18 0218  WBC 8.7  --  8.6  NEUTROABS 5.4  --   --   HGB 11.7* 12.6 11.6*  HCT 35.1* 37.0 35.4*  MCV 88.2  --  89.8  PLT 569*  --  555*   Coagulation:  Recent Labs  Lab 04/05/18 2215  LABPROT 12.4  INR 0.93   Cardiac Enzymes: No results for input(s): CKTOTAL, CKMB, CKMBINDEX, TROPONINI in the last 168 hours. Urinalysis:  Recent Labs  Lab 04/06/18 0107  COLORURINE YELLOW  LABSPEC 1.011  PHURINE 6.0  GLUCOSEU NEGATIVE  HGBUR NEGATIVE  BILIRUBINUR NEGATIVE  KETONESUR NEGATIVE  PROTEINUR NEGATIVE  NITRITE NEGATIVE  LEUKOCYTESUR  TRACE*   Lipid Panel    Component Value Date/Time   CHOL 132 03/23/2018 0420   TRIG 124 03/23/2018 0420   HDL 63 03/23/2018 0420   CHOLHDL 2.1 03/23/2018 0420   VLDL 25 03/23/2018 0420   LDLCALC 44 03/23/2018 0420   HgbA1C  Lab Results  Component Value Date   HGBA1C 5.6 03/23/2018    Urine Drug Screen:      Component Value Date/Time   LABOPIA POSITIVE (A) 03/22/2018 1402   COCAINSCRNUR NONE DETECTED 03/22/2018 1402   LABBENZ NONE DETECTED 03/22/2018 1402   AMPHETMU NONE DETECTED 03/22/2018 1402   THCU POSITIVE (A) 03/22/2018 1402   LABBARB NONE DETECTED 03/22/2018 1402    Therapy Recommendations : Home Health Physical therapy  Physical Exam  HEENT-  Normocephalic, no lesions, without obvious abnormality.  Normal external eye and conjunctiva.   Cardiovascular- S1-S2 audible, pulses palpable throughout   Lungs-no rhonchi or wheezing noted, no excessive working breathing.   Abdomen- All 4 quadrants palpated and nontender Musculoskeletal-no joint tenderness, deformity or swelling Skin-multiple scars and bruises on BLE  Neuro:  Mental Status: Alert, oriented, thought content appropriate.  Speech fluent without evidence of aphasia.  Able to follow 3 step commands without difficulty. Cranial Nerves: II: Left partial homonymous hemianopsia  III,IV, VI: ptosis not present, extra-ocular motions intact bilaterally pupils equal, round, reactive to light  V,VII: smile symmetric, facial light touch sensation normal bilaterally VIII: hearing intact to voice IX,X: uvula rises symmetrically XI: bilateral shoulder shrug XII: midline tongue extension Motor: Left arm ataxic but 4 out of 5 strength, weakness, impaired fine motor functioning on the left hand,  4/5 strength in LLE  Right : Upper extremity   5/5    Left:     Upper extremity   4/5  Lower extremity   5/5     Lower extremity   4/5 Tone and bulk:normal tone throughout; no atrophy noted Sensory: Normal to temperature and  tactile sensation  Cerebellar: normal finger-to-nose, as well as bradykinetic orbiting on the left hand. Gait: Not tested  Ct Head Code Stroke Wo Contrast 04/05/2018 IMPRESSION:  1. No acute hemorrhage or mass effect.  2. 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  3. ASPECTS is 10.  Telemetry: SR HR 65  ASSESSMENT/PLAN 67 year old female with recent right parietal occipital infarct likely due to hypotension in the setting of carotid occlusion.  She is therefore not a candidate for IV TPA. Strokelike symptoms likely  secondary to hypotension in the setting of carotid occlusion.  Patient presented with blood pressure in the 90s and left-sided weakness.  On assessment patient is back to baseline mentation and baseline deficits and persistent left homonymous hemaniopsia  CT of the brain : No acute hemorrhage or mass-effect, but did reveal chronic ischemic microangiopathy and nonacute right frontal lobe infarct  No further imaging warranted as patient is back to baseline, and recently underwent a full stroke work-up  Maintain SBP 110 -140s, titrate antihypertensives accordingly  VTE : Lovenox  Antiplatelets continue dual antiplatelets aspirin 81 mg daily and clopidogrel 75 mg daily x 3 months then aspirin alone  Continue in-home physical therapy  Therapy recommendations:  Home health  Disposition:   Home *Patient was counseled not to drive till the peripheral visual field improved and to quit marijuana.  History of hypertension with frequent incidences of hypotension  Hospital medicine to titrate antihypertensives to maintain SBP between 110-140   Electrolyte derangements with hypokalemia and hypomagnesemia  Place electrolytes and continue to monitor  Patient has since stopped HCTZ and amlodipine    Other Stroke Risk Factors  Advanced age  Cigarette smoker, advised to stop smoking  ETOH use, advised to drink no more than 1 drink(s) a  day  Other Active Problems  Chronic hip and back pain  Hospital day # 0  SIGNED Noralee Spaceearl Nyanor [facility]NP Neuro-hospitalist Team [phone] 04/06/2018, 12:37 PM   04/06/2018 ATTENDING ASSESSMENT   I have personally examined this patient, reviewed  HPI in details and notes, independently viewed imaging studies, participated in medical decision making and plan of care.ROS completed by me personally and pertinent positives fully documented  I have made any additions or clarifications directly to the above note. Agree with note above.  She presented with transient left-sided deficits in the setting of hypertension which appears to be improving. Recommend discontinuing amlodipine and hydrochlorothiazide and avoid hypotension. Keep systolic blood pressure greater than 110. No need for additional stroke workup at this time. Greater than 50% time during this 25 minute visit was spent on counseling and coordination of care about her carotid occlusion and worsening of recent stroke deficits and answered questions. Discussed with patient and husband and Dr. Irene LimboGoodrich.Stroke team will sign off. Kindly call for questions.  Delia HeadyPramod Carley Glendenning, MD Medical Director [facility] Pager: [phone] 04/06/2018 3:03 PM     To contact Stroke Continuity provider, please refer to [email or web address]. After hours, contact General Neurology

## 2018-04-06 NOTE — Progress Notes (Signed)
Patient arrived from ED to #W around 0130, she is alert and at usual level of orientation, appears to be back to base line with mild left side weakness, facial droop is less obvious. She is impulsive which makes her a fallrisk due to intermittent orthostatic hypotension as  related to her stenosed carotid artery. From the orders I have seen we are not doing standard stroke work up as they entail neuro check every 2 hours and not [facility] assesments once per shift and vitals every 2 hours will consult attending to make sure.

## 2018-04-06 NOTE — Progress Notes (Addendum)
Initial Nutrition Assessment  DOCUMENTATION CODES:   Non-severe (moderate) malnutrition in context of chronic illness  INTERVENTION:  Ensure Enlive po BID, each supplement provides 350 kcal and 20 grams of protein  NUTRITION DIAGNOSIS:   Moderate Malnutrition related to chronic illness(COPD, past surgery) as evidenced by moderate fat depletion, moderate muscle depletion.  GOAL:   Patient will meet greater than or equal to 90% of their needs  MONITOR:   PO intake, Supplement acceptance, Skin, Weight trends, Labs, I & O's  REASON FOR ASSESSMENT:   Consult Assessment of nutrition requirement/status  ASSESSMENT:   F 67 y.o. admitted on 04/05/18 for suspected stoke due to L-sided weakness, abdominal pain. Pt current smoker (1 pack/day). PMH of HTN, COPD. PSH of cholecystectomy.   Per chart pt weight stable since 2016. Per Pt and family report pt lost 40 lbs about 2 years ago bringing her down to 109 lbs. At this time she was only eating 1 TV dinner a day due to the pain from her hip surgery - undetermined amount of time. Suspect at this time pt became malnourished.  Pt reports that she gained back up to 115 lbs in February.  For the last couple of weeks pt reports eating mildly less than usual due to acute illness, but was unable to specify. Pt reports before this she was eating 1-2 meals a day "I eat good, I eat whenever I am hungry". Pt reports eating banana and tomato sandwiches, some boiled eggs for breakfast (if she has it), TV dinners, and whatever her husband cooks for dinner. Pt also reports having boost BID. Pt reports that her intake is variable - unable to get good picture of how she has been eating, suspect it is </= 75% estimated needs.  Per chart review pt eating 50% of all meals.   Given depletions, pt is malnourished; suspect this is continued from when she first lost the weight.  Noted that pt with bruising to arms and legs. No reported falls, pt reports getting around  and performing ADLs well.   Medications reviewed: colace, remeron,singulair, MVI with minerals, oxycodone, protonix, zoloft, 0.9% NaCl 100 ml/hr, norco.   Labs reviewed: Na 127 (L), CL 92 (L), BG 112 (H), hemoglobin 11.6 (L), HCT 35.4 (L), platelets 555 (H).   NUTRITION - FOCUSED PHYSICAL EXAM:    Most Recent Value  Orbital Region  Mild depletion  Upper Arm Region  Moderate depletion  Thoracic and Lumbar Region  Moderate depletion  Buccal Region  Moderate depletion  Temple Region  Mild depletion  Clavicle Bone Region  Moderate depletion  Clavicle and Acromion Bone Region  Moderate depletion  Scapular Bone Region  Unable to assess  Dorsal Hand  No depletion  Patellar Region  Mild depletion  Anterior Thigh Region  Moderate depletion  Posterior Calf Region  Moderate depletion  Edema (RD Assessment)  None  Hair  Reviewed  Eyes  Reviewed  Mouth  Reviewed  Skin  Reviewed  Nails  Reviewed       Diet Order:   Diet Order           Diet regular Room service appropriate? Yes; Fluid consistency: Thin  Diet effective now          EDUCATION NEEDS:   Education needs have been addressed  Skin:  Skin Assessment: Reviewed RN Assessment  Last BM:  04/05/18  Height:   Ht Readings from Last 1 Encounters:  04/05/18 5\' 4"  (1.626 m)   Weight:   Wt  Readings from Last 1 Encounters:  04/06/18 112 lb 10.5 oz (51.1 kg)    Ideal Body Weight:  54.54 kg  BMI:  Body mass index is 19.34 kg/m.  Estimated Nutritional Needs:   Kcal:  1500-1700 kcal  Protein:  75-90 grams  Fluid:  >/= 1.5 L or per MD    Sherrine Maples, Dietetic Intern

## 2018-04-07 DIAGNOSIS — E440 Moderate protein-calorie malnutrition: Secondary | ICD-10-CM

## 2018-04-07 LAB — URINE CULTURE

## 2018-04-07 LAB — BASIC METABOLIC PANEL
Anion gap: 8 (ref 5–15)
BUN: <5 mg/dL — ABNORMAL LOW (ref 6–20)
CALCIUM: 8.5 mg/dL — AB (ref 8.9–10.3)
CO2: 24 mmol/L (ref 22–32)
CREATININE: 0.45 mg/dL (ref 0.44–1.00)
Chloride: 104 mmol/L (ref 101–111)
GLUCOSE: 102 mg/dL — AB (ref 65–99)
Potassium: 3.2 mmol/L — ABNORMAL LOW (ref 3.5–5.1)
Sodium: 136 mmol/L (ref 135–145)

## 2018-04-07 MED ORDER — ENSURE ENLIVE PO LIQD: 237.0000 mL | Freq: Two times a day (BID) | ORAL | Status: DC

## 2018-04-07 MED ORDER — POTASSIUM CHLORIDE CRYS ER 20 MEQ PO TBCR
40.0000 meq | EXTENDED_RELEASE_TABLET | Freq: Once | ORAL | Status: AC
  Administered 2018-04-07: 40 meq via ORAL
  Filled 2018-04-07: qty 2

## 2018-04-07 NOTE — Discharge Summary (Signed)
Physician Discharge Summary  Pam Johnson ZOX:[id number] DOB: [birth year] DOA: 04/05/2018  PCP: Patient, No Pcp Per  Admit date: 04/05/2018 Discharge date: 04/07/2018   PATIENT LEFT AMA  Recommendations for Outpatient Follow-up:  1. Recommend continuing aspirin and Plavix as previously outlined by neurology. 2. Hold antihypertensives, HCTZ and amlodipine given hypotensive episode at home and hyponatremia. 3. Maintain SBP 110-140s.   Discharge Diagnoses:  1. Left-sided weakness, strokelike symptoms, thought secondary to hypotension with baseline carotid occlusion 2. Hypotension reported prior to presentation 3. Hyponatremia with mild nausea 4. 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 5. Essential hypertension  Abdominal pain.  Resolved.  CT abdomen pelvis negative.  No further evaluation suggested  Discharge Condition: improved Disposition: left AMA  Diet recommendation: heart healthy  Filed Weights   04/05/18 2214 04/06/18 0118  Weight: 50.3 kg (111 lb) 51.1 kg (112 lb 10.5 oz)    History of present illness:  67 year old woman PMH carotid occlusion, hospitalized May for right parietal occipital infarct treated with IV TPA.  Carotid could not be revascularized.  Subsequently discharged to inpatient rehab.  Presented with left-sided weakness.  Reportedly hypotensive prior to presentation.  Recently started on amlodipine.  Hospital Course:  Patient was observed overnight.  She was seen by neurology.  No new recommendations except to avoid hypotension and hold off on antihypertensives unless needed.  No evidence of stroke.  Further information below.  Hyponatremia secondary to voluminous water intake with no salt intake.  This resolved with saline infusion.  Other issues resolved or remains stable as applicable, see below.  6/14 in the morning the patient decided to leave AMA before I was able to see her.  Fortunately hyponatremia is corrected and there are no new  neurologic recommendations and no further work-up is indicated. Sodium 136 on discharge.  Left-sided weakness, thought secondary to hypotension with baseline carotid occlusion.  Neurology recommended continue Plavix, avoid hypotension. CT head negative. --Continue Plavix, aspirin.   --Physical therapy is recommended home health PT --Stop hydrochlorothiazide and amlodipine  Hypotension reported prior to presentation.  Systolic in the 90s on presentation.  Likely related to newly started medication amlodipine. --Resolved.    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 --continue aspirin, Plavix  Hyponatremia with mild nausea.  Cortisol not low.  TSH within normal limits. --Likely secondary to voluminous water intake with no salt intake as well as hydrochlorothiazide.  Resolved with saline infusion.  Essential hypertension recently started on amlodipine. --Hold amlodipine as above  Abdominal pain.  Resolved.  CT abdomen pelvis negative.  No further evaluation suggested  Consultants:  Neurology  Procedures:  none  Antimicrobials:  none  Discharge Instructions   Allergies as of 04/07/2018      Reactions   Acyclovir And Related Other (See Comments)   unknown   Ciprofloxacin Other (See Comments)   Altered mental status.   Ciprofloxacin Other (See Comments)   Confused and nausea   Penicillins Other (See Comments)   Unknown reaction Has patient had a PCN reaction causing immediate rash, facial/tongue/throat swelling, SOB or lightheadedness with hypotension: NO Has patient had a PCN reaction causing severe rash involving mucus membranes or skin necrosis: NO Has patient had a PCN reaction that required hospitalization: NO Has patient had a PCN reaction occurring within the last 10 years: NO If all of the above answers are "NO", then may proceed with Cephalosporin use.   Promethazine Other (See Comments)   Was advised by her PCP not to take this medication.  Morphine Nausea And Vomiting      Medication List    ASK your doctor about these medications   acetaminophen 325 MG tablet Commonly known as:  TYLENOL Take 1-2 tablets (325-650 mg total) by mouth every 6 (six) hours as needed for mild pain.   albuterol 108 (90 Base) MCG/ACT inhaler Commonly known as:  PROVENTIL HFA;VENTOLIN HFA Inhale 2 puffs into the lungs every 6 (six) hours as needed for wheezing or shortness of breath.   amLODipine 10 MG tablet Commonly known as:  NORVASC Take 1 tablet (10 mg total) by mouth daily.   aspirin 81 MG EC tablet Take 1 tablet (81 mg total) by mouth daily.   clopidogrel 75 MG tablet Commonly known as:  PLAVIX Take 1 tablet (75 mg total) by mouth daily.   docusate sodium 100 MG capsule Commonly known as:  COLACE Take 1 capsule (100 mg total) by mouth 2 (two) times daily.   fluticasone 50 MCG/ACT nasal spray Commonly known as:  FLONASE Place 2 sprays into both nostrils daily as needed for allergies or rhinitis.   Fluticasone-Salmeterol 500-50 MCG/DOSE Aepb Commonly known as:  ADVAIR Inhale 1 puff into the lungs 2 (two) times daily.   HYDROcodone-acetaminophen 7.5-325 MG tablet Commonly known as:  NORCO Take 1 tablet by mouth 2 (two) times daily as needed (breakthrough pain).   hydrocortisone cream 1 % Apply 1 application topically 2 (two) times daily as needed for itching.   metoCLOPramide 5 MG tablet Commonly known as:  REGLAN Take 5 mg by mouth 4 (four) times daily.   mirtazapine 7.5 MG tablet Commonly known as:  REMERON Take 1 tablet (7.5 mg total) by mouth at bedtime.   multivitamin with minerals Tabs tablet Take 1 tablet by mouth daily.   MUSCLE RUB 10-15 % Crea Apply 1 application topically 4 (four) times daily. To knees   nicotine 14 mg/24hr patch Commonly known as:  NICODERM CQ - dosed in mg/24 hours Place 1 patch (14 mg total) onto the skin daily.   nitroGLYCERIN 0.4 MG SL tablet Commonly known as:  NITROSTAT Place  0.4 mg under the tongue every 5 (five) minutes as needed for chest pain.   olopatadine 0.1 % ophthalmic solution Commonly known as:  PATANOL Place 1 drop into both eyes 2 (two) times daily.   oxyCODONE 10 mg 12 hr tablet Commonly known as:  OXYCONTIN Take 1 tablet (10 mg total) by mouth every 12 (twelve) hours.   pantoprazole 40 MG tablet Commonly known as:  PROTONIX Take 1 tablet (40 mg total) by mouth daily.   sertraline 100 MG tablet Commonly known as:  ZOLOFT Take 150 mg by mouth daily.   SINGULAIR 10 MG tablet Generic drug:  montelukast Take 10 mg by mouth daily.   tiotropium 18 MCG inhalation capsule Commonly known as:  SPIRIVA Place 18 mcg into inhaler and inhale daily.   triamcinolone cream 0.5 % Commonly known as:  KENALOG Apply 1 application topically daily as needed (rash).      Allergies  Allergen Reactions  . Acyclovir And Related Other (See Comments)    unknown  . Ciprofloxacin Other (See Comments)    Altered mental status.  . Ciprofloxacin Other (See Comments)    Confused and nausea   . Penicillins Other (See Comments)    Unknown reaction Has patient had a PCN reaction causing immediate rash, facial/tongue/throat swelling, SOB or lightheadedness with hypotension: NO Has patient had a PCN reaction causing severe rash involving mucus membranes  or skin necrosis: NO Has patient had a PCN reaction that required hospitalization: NO Has patient had a PCN reaction occurring within the last 10 years: NO If all of the above answers are "NO", then may proceed with Cephalosporin use.   . Promethazine Other (See Comments)    Was advised by her PCP not to take this medication.  . Morphine Nausea And Vomiting    The results of significant diagnostics from this hospitalization (including imaging, microbiology, ancillary and laboratory) are listed below for reference.    Significant Diagnostic Studies: Ct Abdomen Pelvis W Contrast  Result Date:  04/06/2018 CLINICAL DATA:  Acute abdominal pain EXAM: CT ABDOMEN AND PELVIS WITH CONTRAST TECHNIQUE: Multidetector CT imaging of the abdomen and pelvis was performed using the standard protocol following bolus administration of intravenous contrast. CONTRAST:  100mL OMNIPAQUE IOHEXOL 300 MG/ML  SOLN COMPARISON:  None. FINDINGS: LOWER CHEST: No basilar pulmonary nodules or pleural effusion. No apical pericardial effusion. HEPATOBILIARY: Normal hepatic contours and density. Dilated common bile duct is compatible with post cholecystectomy status. Status post cholecystectomy. PANCREAS: Normal parenchymal contours without ductal dilatation. No peripancreatic fluid collection. SPLEEN: Normal. ADRENALS/URINARY TRACT: --Adrenal glands: Normal. --Right kidney/ureter: No hydronephrosis, nephroureterolithiasis, perinephric stranding or solid renal mass. --Left kidney/ureter: No hydronephrosis, nephroureterolithiasis, perinephric stranding or solid renal mass. --Urinary bladder: Normal for degree of distention STOMACH/BOWEL: --Stomach/Duodenum: Medium-sized hiatal hernia. Normal course of the duodenum. --Small bowel: No dilatation or inflammation. --Colon: No focal abnormality. --Appendix: Not visualized. No right lower quadrant inflammation or free fluid. VASCULAR/LYMPHATIC: Atherosclerotic calcification is present within the non-aneurysmal abdominal aorta, without hemodynamically significant stenosis. The portal vein, splenic vein, superior mesenteric vein and IVC are patent. No abdominal or pelvic lymphadenopathy. REPRODUCTIVE: Status post hysterectomy. No adnexal mass. MUSCULOSKELETAL. No bony spinal canal stenosis or focal osseous abnormality. Right total hip arthroplasty. OTHER: None. IMPRESSION: 1. No acute abnormality of the abdomen or pelvis. 2. Medium-sized hiatal hernia. 3. Aortic Atherosclerosis (ICD10-I70.0). Electronically Signed   By: Deatra RobinsonKevin  Herman M.D.   On: 04/06/2018 03:30   Ct Head Code Stroke Wo  Contrast  Result Date: 04/05/2018 CLINICAL DATA:  Code stroke. Left-sided weakness, facial droop and slurred speech. EXAM: CT HEAD WITHOUT CONTRAST TECHNIQUE: Contiguous axial images were obtained from the base of the skull through the vertex without intravenous contrast. COMPARISON:  Head CT 03/23/2018 FINDINGS: Brain: There is no mass, hemorrhage or extra-axial collection. The size and configuration of the ventricles and extra-axial CSF spaces are normal. There is an old right frontal lobe infarct. Old left lentiform nucleus small infarct. There is hypoattenuation of the periventricular white matter, most commonly indicating chronic ischemic microangiopathy. Vascular: No abnormal hyperdensity of the major intracranial arteries or dural venous sinuses. No intracranial atherosclerosis. Skull: The visualized skull base, calvarium and extracranial soft tissues are normal. Sinuses/Orbits: No fluid levels or advanced mucosal thickening of the visualized paranasal sinuses. No mastoid or middle ear effusion. The orbits are normal. ASPECTS [facility]([place] Stroke Program Early CT Score) - Ganglionic level infarction (caudate, lentiform nuclei, internal capsule, insula, M1-M3 cortex): 7 - Supraganglionic infarction (M4-M6 cortex): 3 Total score (0-10 with 10 being normal): 10 IMPRESSION: 1. No acute hemorrhage or mass effect. 2. 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 3. ASPECTS is 10. These results were communicated to Dr. Ritta SlotMcNeill Kirkpatrick at 10:29 pm on 04/05/2018 by text page via the [facility] messaging system. Electronically Signed   By: Deatra RobinsonKevin  Herman M.D.   On: 04/05/2018 22:32   Microbiology: Recent Results (from  the past 240 hour(s))  Culture, blood (Routine X 2) w Reflex to ID Panel     Status: None (Preliminary result)   Collection Time: 04/06/18 12:19 AM  Result Value Ref Range Status   Specimen Description BLOOD LEFT HAND  Final   Special Requests   Final    BOTTLES DRAWN  AEROBIC AND ANAEROBIC Blood Culture adequate volume   Culture   Final    NO GROWTH 1 DAY Performed at [facility], [address], [place], [place] [address]    Report Status PENDING  Incomplete  Urine Culture     Status: Abnormal   Collection Time: 04/06/18  1:07 AM  Result Value Ref Range Status   Specimen Description URINE, RANDOM  Final   Special Requests NONE  Final   Culture (A)  Final    30,000 COLONIES/mL LACTOBACILLUS SPECIES Standardized susceptibility testing for this organism is not available. Performed at [facility], [address], [place], [place] [address]    Report Status 04/07/2018 FINAL  Final  Culture, blood (Routine X 2) w Reflex to ID Panel     Status: None (Preliminary result)   Collection Time: 04/06/18  2:18 AM  Result Value Ref Range Status   Specimen Description BLOOD LEFT ANTECUBITAL  Final   Special Requests   Final    BOTTLES DRAWN AEROBIC ONLY Blood Culture results may not be optimal due to an inadequate volume of blood received in culture bottles   Culture   Final    NO GROWTH 1 DAY Performed at [facility], [address], [place], [place] [address]    Report Status PENDING  Incomplete     Labs: Basic Metabolic Panel: Recent Labs  Lab 04/05/18 2215 04/05/18 2221 04/06/18 0218 04/07/18 0532  NA 127* 126* 127* 136  K 2.4* 2.4* 3.6 3.2*  CL 87* 85* 92* 104  CO2 27  --  26 24  GLUCOSE 114* 109* 112* 102*  BUN 8 8 7  <5*  CREATININE 0.86 0.80 0.66 0.45  CALCIUM 9.0  --  8.4* 8.5*  MG  --   --  2.0  --    Liver Function Tests: Recent Labs  Lab 04/05/18 2215  AST 24  ALT 14  ALKPHOS 74  BILITOT 0.4  PROT 6.9  ALBUMIN 3.5   Recent Labs  Lab 04/06/18 0019  LIPASE 22   CBC: Recent Labs  Lab 04/05/18 2215 04/05/18 2221 04/06/18 0218  WBC 8.7  --  8.6  NEUTROABS 5.4  --   --   HGB 11.7* 12.6 11.6*  HCT 35.1* 37.0 35.4*  MCV 88.2  --  89.8  PLT 569*  --  555*    Principal Problem:    Hyponatremia Active Problems:   Stroke (cerebrum) (HCC)   Essential hypertension   Depression   Left-sided weakness   Malnutrition of moderate degree   Time coordinating discharge: 25 minutes  Signed:  Brendia Sacks, MD [facility] 04/07/2018, 5:17 PM

## 2018-04-07 NOTE — Progress Notes (Signed)
Pt.'s husband arrived to unit without wheelchair.  Pt ambulated off unit with unsteady gait, apparent SOB and pursed lip breathing.  AKingBSNRN

## 2018-04-07 NOTE — Progress Notes (Signed)
Spoke with Dr. Irene LimboGoodrich advised pt leaving AMA,  Advised pt he would be to see pt within the hour and pt refused to wait, "my husband is on his way now."

## 2018-04-07 NOTE — Progress Notes (Signed)
Patient ambulated to bathroom 4-5 times over night became nauseous once gave Zofran over all is doing well, no new onset of neuro symptoms.

## 2018-04-07 NOTE — Care Management Note (Signed)
Case Management Note  Patient Details  Name: Pam Johnson MRN: [id number] Date of Birth: 06/20/1951  Subjective/Objective:                    Action/Plan: Pt left with spouse AMA. No further needs per CM.  Expected Discharge Date:  04/07/18               Expected Discharge Plan:  Home w Home Health Services  In-House Referral:     Discharge planning Services  CM Consult  Post Acute Care Choice:    Choice offered to:     DME Arranged:    DME Agency:     HH Arranged:    HH Agency:     Status of Service:  Completed, signed off  If discussed at [employer] Meetings, dates discussed:    Additional Comments:  Kermit BaloKelli F Renne Cornick, RN 04/07/2018, 11:25 AM

## 2018-04-07 NOTE — Progress Notes (Signed)
Physical Therapy Treatment Patient Details Name: Townsend RogerConstance M Frison MRN: [id number] DOB: 05/23/1951 Today's Date: 04/07/2018    History of Present Illness Patient is a 67 y/o female presenting to the ED on 04/05/18 with worsening L sided weakness, hypotension, and abdominal pain. Of note, paitent with prior CVA ~2 weeks prior to this admission with with occluded R carotid. Head CT negative for acute intracranial abnormalities. Patient with a PMH significant for  hypertension, COPD, R carotid occlusion, stroke with mild left-sided weakness,,GERD, spinal stenosis, depression.     PT Comments    Ms. Fineberg emotional upon PT arrival. Willing to work with PT this AM. Patient with poor safety awareness today with poor awareness of surroundings increasing her fall risk. Performing transfers and bed mobility with Min guard to Min A for RW management as patient drifts R and L and often hitting wall with RW. Discussed need to scan environment in the home and community for safety and reduced falls with limited carryover. Per nursing note after PT session, patient planning to leave AMA.    Follow Up Recommendations  Home health PT;Supervision/Assistance - 24 hour     Equipment Recommendations  None recommended by PT    Recommendations for Other Services       Precautions / Restrictions Precautions Precautions: Fall Restrictions Weight Bearing Restrictions: No    Mobility  Bed Mobility               General bed mobility comments: sitting EOB upon PT arrival  Transfers Overall transfer level: Needs assistance Equipment used: Rolling walker (2 wheeled) Transfers: Sit to/from [employer];Stand Pivot Transfers Sit to Stand: Min guard Stand pivot transfers: Min guard       General transfer comment: for safety - patient with reduced awareness of surroundings and obstacles in her path increasing her fall risk  Ambulation/Gait Ambulation/Gait assistance: Min guard;Min assist Gait Distance  (Feet): 120 Feet Assistive device: Rolling walker (2 wheeled) Gait Pattern/deviations: Step-through pattern;Decreased stride length;Drifts right/left;Trunk flexed;Narrow base of support Gait velocity: decreased   General Gait Details: patient with poor awareness of surroundings with PT providing Min A for RW management as patient does not scan environment and often runs into wall - heavy VC to improve safety awareness with limited carryover   Stairs             Wheelchair Mobility    Modified Rankin (Stroke Patients Only)       Balance Overall balance assessment: Needs assistance Sitting-balance support: No upper extremity supported;Feet supported Sitting balance-Leahy Scale: Fair     Standing balance support: Bilateral upper extremity supported;During functional activity Standing balance-Leahy Scale: Fair                              Cognition Arousal/Alertness: Awake/alert Behavior During Therapy: Impulsive Overall Cognitive Status: Impaired/Different from baseline Area of Impairment: Following commands;Safety/judgement;Problem solving;Attention                   Current Attention Level: Selective   Following Commands: Follows one step commands consistently;Follows one step commands with increased time Safety/Judgement: Decreased awareness of safety;Decreased awareness of deficits   Problem Solving: Requires verbal cues;Requires tactile cues;Decreased initiation General Comments: Patient continues to be impulsive with activity, at one time almost tripping over bedside table requiring Min A to maintain balance      Exercises      General Comments General comments (skin integrity, edema, etc.): multiple covered skin  abrasions/tears      Pertinent Vitals/Pain Pain Assessment: Faces Faces Pain Scale: Hurts even more Pain Location: "my back is killing me" Pain Descriptors / Indicators: Grimacing;Moaning Pain Intervention(s): Limited  activity within patient's tolerance;Monitored during session;Repositioned    Home Living                      Prior Function            PT Goals (current goals can now be found in the care plan section) Acute Rehab PT Goals Patient Stated Goal: return home PT Goal Formulation: With patient Time For Goal Achievement: 04/20/18 Potential to Achieve Goals: Good Progress towards PT goals: Progressing toward goals    Frequency    Min 4X/week      PT Plan Current plan remains appropriate    Co-evaluation              AM-PAC PT "6 Clicks" Daily Activity  Outcome Measure  Difficulty turning over in bed (including adjusting bedclothes, sheets and blankets)?: A Little Difficulty moving from lying on back to sitting on the side of the bed? : A Little Difficulty sitting down on and standing up from a chair with arms (e.g., wheelchair, bedside commode, etc,.)?: Unable Help needed moving to and from a bed to chair (including a wheelchair)?: A Little Help needed walking in hospital room?: A Little Help needed climbing 3-5 steps with a railing? : A Little 6 Click Score: 16    End of Session Equipment Utilized During Treatment: Gait belt Activity Tolerance: Patient tolerated treatment well Patient left: in bed;with call bell/phone within reach Nurse Communication: Mobility status PT Visit Diagnosis: Unsteadiness on feet (R26.81);Other abnormalities of gait and mobility (R26.89);Other symptoms and signs involving the nervous system (Z61.096)     Time: 0454-0981 PT Time Calculation (min) (ACUTE ONLY): 20 min  Charges:  $Gait Training: 8-22 mins                    G Codes:      Kipp Laurence, PT, DPT 04/07/18 9:48 AM

## 2018-04-07 NOTE — Progress Notes (Signed)
Pt is planning to leave AMA, all risks and benefits explained to pt and she insists on leaving before MD rounds, call placed to notify MD.  Channing MuttersAKingBSNRN

## 2018-04-10 DIAGNOSIS — G894 Chronic pain syndrome: Secondary | ICD-10-CM

## 2018-04-11 ENCOUNTER — Encounter: Payer: Medicare Other | Admitting: Physical Medicine & Rehabilitation

## 2018-04-11 ENCOUNTER — Encounter: Payer: Medicare Other | Attending: Physical Medicine & Rehabilitation

## 2018-04-11 LAB — CULTURE, BLOOD (ROUTINE X 2)
CULTURE: NO GROWTH
CULTURE: NO GROWTH
Special Requests: ADEQUATE

## 2018-04-17 ENCOUNTER — Other Ambulatory Visit: Payer: Self-pay | Admitting: Physical Medicine and Rehabilitation

## 2018-04-20 ENCOUNTER — Other Ambulatory Visit: Payer: Self-pay | Admitting: Physical Medicine and Rehabilitation

## 2018-04-25 ENCOUNTER — Other Ambulatory Visit: Payer: Self-pay | Admitting: Physical Medicine and Rehabilitation

## 2018-05-24 DIAGNOSIS — S2242XA Multiple fractures of ribs, left side, initial encounter for closed fracture: Secondary | ICD-10-CM | POA: Insufficient documentation

## 2018-07-12 DIAGNOSIS — H53462 Homonymous bilateral field defects, left side: Secondary | ICD-10-CM | POA: Insufficient documentation

## 2018-07-18 ENCOUNTER — Observation Stay (HOSPITAL_COMMUNITY)
Admission: EM | Admit: 2018-07-18 | Discharge: 2018-07-19 | Disposition: A | Discharge status: Home Health | Payer: Medicare Other | Attending: Internal Medicine | Admitting: Internal Medicine

## 2018-07-18 ENCOUNTER — Emergency Department (HOSPITAL_COMMUNITY): Payer: Medicare Other

## 2018-07-18 ENCOUNTER — Other Ambulatory Visit: Payer: Self-pay

## 2018-07-18 ENCOUNTER — Encounter (HOSPITAL_COMMUNITY): Payer: Self-pay | Admitting: *Deleted

## 2018-07-18 DIAGNOSIS — Z7902 Long term (current) use of antithrombotics/antiplatelets: Secondary | ICD-10-CM | POA: Insufficient documentation

## 2018-07-18 DIAGNOSIS — J9602 Acute respiratory failure with hypercapnia: Secondary | ICD-10-CM | POA: Diagnosis present

## 2018-07-18 DIAGNOSIS — I69354 Hemiplegia and hemiparesis following cerebral infarction affecting left non-dominant side: Secondary | ICD-10-CM | POA: Diagnosis not present

## 2018-07-18 DIAGNOSIS — R7989 Other specified abnormal findings of blood chemistry: Secondary | ICD-10-CM

## 2018-07-18 DIAGNOSIS — J9622 Acute and chronic respiratory failure with hypercapnia: Secondary | ICD-10-CM | POA: Insufficient documentation

## 2018-07-18 DIAGNOSIS — J441 Chronic obstructive pulmonary disease with (acute) exacerbation: Principal | ICD-10-CM | POA: Insufficient documentation

## 2018-07-18 DIAGNOSIS — Z79891 Long term (current) use of opiate analgesic: Secondary | ICD-10-CM | POA: Diagnosis not present

## 2018-07-18 DIAGNOSIS — F419 Anxiety disorder, unspecified: Secondary | ICD-10-CM | POA: Insufficient documentation

## 2018-07-18 DIAGNOSIS — K449 Diaphragmatic hernia without obstruction or gangrene: Secondary | ICD-10-CM | POA: Insufficient documentation

## 2018-07-18 DIAGNOSIS — Z791 Long term (current) use of non-steroidal anti-inflammatories (NSAID): Secondary | ICD-10-CM | POA: Diagnosis not present

## 2018-07-18 DIAGNOSIS — Z8249 Family history of ischemic heart disease and other diseases of the circulatory system: Secondary | ICD-10-CM | POA: Insufficient documentation

## 2018-07-18 DIAGNOSIS — J9621 Acute and chronic respiratory failure with hypoxia: Secondary | ICD-10-CM | POA: Insufficient documentation

## 2018-07-18 DIAGNOSIS — Z9981 Dependence on supplemental oxygen: Secondary | ICD-10-CM | POA: Insufficient documentation

## 2018-07-18 DIAGNOSIS — Z881 Allergy status to other antibiotic agents status: Secondary | ICD-10-CM | POA: Insufficient documentation

## 2018-07-18 DIAGNOSIS — F329 Major depressive disorder, single episode, unspecified: Secondary | ICD-10-CM | POA: Insufficient documentation

## 2018-07-18 DIAGNOSIS — Z79899 Other long term (current) drug therapy: Secondary | ICD-10-CM | POA: Insufficient documentation

## 2018-07-18 DIAGNOSIS — Z7982 Long term (current) use of aspirin: Secondary | ICD-10-CM | POA: Insufficient documentation

## 2018-07-18 DIAGNOSIS — I700 Atherosclerosis of aorta: Secondary | ICD-10-CM | POA: Insufficient documentation

## 2018-07-18 DIAGNOSIS — F17210 Nicotine dependence, cigarettes, uncomplicated: Secondary | ICD-10-CM | POA: Insufficient documentation

## 2018-07-18 DIAGNOSIS — G894 Chronic pain syndrome: Secondary | ICD-10-CM | POA: Insufficient documentation

## 2018-07-18 DIAGNOSIS — K219 Gastro-esophageal reflux disease without esophagitis: Secondary | ICD-10-CM | POA: Insufficient documentation

## 2018-07-18 DIAGNOSIS — I2510 Atherosclerotic heart disease of native coronary artery without angina pectoris: Secondary | ICD-10-CM | POA: Diagnosis not present

## 2018-07-18 DIAGNOSIS — Z7951 Long term (current) use of inhaled steroids: Secondary | ICD-10-CM | POA: Insufficient documentation

## 2018-07-18 DIAGNOSIS — R739 Hyperglycemia, unspecified: Secondary | ICD-10-CM | POA: Diagnosis not present

## 2018-07-18 DIAGNOSIS — Z880 Allergy status to penicillin: Secondary | ICD-10-CM | POA: Diagnosis not present

## 2018-07-18 DIAGNOSIS — Z885 Allergy status to narcotic agent status: Secondary | ICD-10-CM | POA: Insufficient documentation

## 2018-07-18 DIAGNOSIS — I10 Essential (primary) hypertension: Secondary | ICD-10-CM | POA: Diagnosis not present

## 2018-07-18 DIAGNOSIS — Z9889 Other specified postprocedural states: Secondary | ICD-10-CM | POA: Insufficient documentation

## 2018-07-18 DIAGNOSIS — Z96641 Presence of right artificial hip joint: Secondary | ICD-10-CM | POA: Insufficient documentation

## 2018-07-18 DIAGNOSIS — E872 Acidosis: Secondary | ICD-10-CM | POA: Diagnosis not present

## 2018-07-18 DIAGNOSIS — Z66 Do not resuscitate: Secondary | ICD-10-CM | POA: Insufficient documentation

## 2018-07-18 LAB — HEMOGLOBIN A1C
Hgb A1c MFr Bld: 5.9 % — ABNORMAL HIGH (ref 4.8–5.6)
Mean Plasma Glucose: 122.63 mg/dL

## 2018-07-18 LAB — CBC WITH DIFFERENTIAL/PLATELET
Abs Immature Granulocytes: 0 10*3/uL (ref 0.0–0.1)
BASOS PCT: 1 %
Basophils Absolute: 0.1 10*3/uL (ref 0.0–0.1)
Eosinophils Absolute: 0.5 10*3/uL (ref 0.0–0.7)
Eosinophils Relative: 7 %
HCT: 39.1 % (ref 36.0–46.0)
Hemoglobin: 11.9 g/dL — ABNORMAL LOW (ref 12.0–15.0)
Immature Granulocytes: 1 %
Lymphocytes Relative: 46 %
Lymphs Abs: 3.4 10*3/uL (ref 0.7–4.0)
MCH: 28.4 pg (ref 26.0–34.0)
MCHC: 30.4 g/dL (ref 30.0–36.0)
MCV: 93.3 fL (ref 78.0–100.0)
MONO ABS: 0.5 10*3/uL (ref 0.1–1.0)
MONOS PCT: 6 %
NEUTROS PCT: 39 %
Neutro Abs: 2.8 10*3/uL (ref 1.7–7.7)
PLATELETS: 420 10*3/uL — AB (ref 150–400)
RBC: 4.19 MIL/uL (ref 3.87–5.11)
RDW: 19.9 % — ABNORMAL HIGH (ref 11.5–15.5)
WBC: 7.3 10*3/uL (ref 4.0–10.5)

## 2018-07-18 LAB — I-STAT ARTERIAL BLOOD GAS, ED
Acid-base deficit: 1 mmol/L (ref 0.0–2.0)
Bicarbonate: 26.3 mmol/L (ref 20.0–28.0)
Bicarbonate: 25 mmol/L (ref 20.0–28.0)
O2 SAT: 100 %
O2 Saturation: 90 %
PCO2 ART: 55.5 mmHg — AB (ref 32.0–48.0)
PH ART: 7.283 — AB (ref 7.350–7.450)
PO2 ART: 305 mmHg — AB (ref 83.0–108.0)
Patient temperature: 98
TCO2: 28 mmol/L (ref 22–32)
TCO2: 26 mmol/L (ref 22–32)
pCO2 arterial: 41.7 mmHg (ref 32.0–48.0)
pH, Arterial: 7.386 (ref 7.350–7.450)
pO2, Arterial: 60 mmHg — ABNORMAL LOW (ref 83.0–108.0)

## 2018-07-18 LAB — BASIC METABOLIC PANEL
Anion gap: 15 (ref 5–15)
BUN: 7 mg/dL — AB (ref 8–23)
CHLORIDE: 103 mmol/L (ref 98–111)
CO2: 20 mmol/L — ABNORMAL LOW (ref 22–32)
CREATININE: 0.73 mg/dL (ref 0.44–1.00)
Calcium: 9.1 mg/dL (ref 8.9–10.3)
Glucose, Bld: 327 mg/dL — ABNORMAL HIGH (ref 70–99)
POTASSIUM: 4.8 mmol/L (ref 3.5–5.1)
Sodium: 138 mmol/L (ref 135–145)

## 2018-07-18 LAB — GLUCOSE, CAPILLARY
Glucose-Capillary: 128 mg/dL — ABNORMAL HIGH (ref 70–99)
Glucose-Capillary: 114 mg/dL — ABNORMAL HIGH (ref 70–99)
Glucose-Capillary: 101 mg/dL — ABNORMAL HIGH (ref 70–99)

## 2018-07-18 LAB — BRAIN NATRIURETIC PEPTIDE: B NATRIURETIC PEPTIDE 5: 586.7 pg/mL — AB (ref 0.0–100.0)

## 2018-07-18 LAB — TROPONIN I: Troponin I: <0.03 ng/mL (ref <0.03)

## 2018-07-18 MED ORDER — MIRTAZAPINE 7.5 MG PO TABS: 7.5000 mg | ORAL_TABLET | Freq: Every day | ORAL | Status: DC

## 2018-07-18 MED ORDER — DOCUSATE SODIUM 100 MG PO CAPS
100.0000 mg | ORAL_CAPSULE | Freq: Two times a day (BID) | ORAL | Status: DC
  Administered 2018-07-18 – 2018-07-19 (×3): 100 mg via ORAL
  Filled 2018-07-18 (×3): qty 1

## 2018-07-18 MED ORDER — VITAMIN B-1 100 MG PO TABS
100.0000 mg | ORAL_TABLET | Freq: Every day | ORAL | Status: DC
  Administered 2018-07-18 – 2018-07-19 (×2): 100 mg via ORAL
  Filled 2018-07-18 (×2): qty 1

## 2018-07-18 MED ORDER — MIRTAZAPINE 30 MG PO TABS
30.0000 mg | ORAL_TABLET | Freq: Every day | ORAL | Status: DC
  Administered 2018-07-18: 30 mg via ORAL
  Filled 2018-07-18: qty 2
  Filled 2018-07-18: qty 1

## 2018-07-18 MED ORDER — ADULT MULTIVITAMIN W/MINERALS CH
1.0000 | ORAL_TABLET | Freq: Every day | ORAL | Status: DC
  Administered 2018-07-18 – 2018-07-19 (×2): 1 via ORAL
  Filled 2018-07-18 (×2): qty 1

## 2018-07-18 MED ORDER — ADULT MULTIVITAMIN W/MINERALS CH: 1.0000 | ORAL_TABLET | Freq: Every day | ORAL | Status: DC

## 2018-07-18 MED ORDER — CLOPIDOGREL BISULFATE 75 MG PO TABS
75.0000 mg | ORAL_TABLET | Freq: Every day | ORAL | Status: DC
  Filled 2018-07-18: qty 1

## 2018-07-18 MED ORDER — THIAMINE HCL 100 MG/ML IJ SOLN
100.0000 mg | Freq: Every day | INTRAMUSCULAR | Status: DC
  Filled 2018-07-18: qty 2

## 2018-07-18 MED ORDER — BUDESONIDE 0.25 MG/2ML IN SUSP
0.2500 mg | Freq: Two times a day (BID) | RESPIRATORY_TRACT | Status: DC
  Administered 2018-07-18 – 2018-07-19 (×2): 0.25 mg via RESPIRATORY_TRACT
  Filled 2018-07-18 (×2): qty 2

## 2018-07-18 MED ORDER — INSULIN ASPART 100 UNIT/ML ~~LOC~~ SOLN: 0.0000 [IU] | Freq: Three times a day (TID) | SUBCUTANEOUS | Status: DC

## 2018-07-18 MED ORDER — IPRATROPIUM-ALBUTEROL 0.5-2.5 (3) MG/3ML IN SOLN: 3.0000 mL | Freq: Once | RESPIRATORY_TRACT | Status: DC

## 2018-07-18 MED ORDER — METOCLOPRAMIDE HCL 10 MG PO TABS: 5.0000 mg | ORAL_TABLET | Freq: Four times a day (QID) | ORAL | Status: DC

## 2018-07-18 MED ORDER — MONTELUKAST SODIUM 10 MG PO TABS
10.0000 mg | ORAL_TABLET | Freq: Every day | ORAL | Status: DC
  Administered 2018-07-19: 10 mg via ORAL
  Filled 2018-07-18: qty 1

## 2018-07-18 MED ORDER — MELOXICAM 7.5 MG PO TABS
7.5000 mg | ORAL_TABLET | Freq: Every day | ORAL | Status: DC
  Administered 2018-07-18 – 2018-07-19 (×2): 7.5 mg via ORAL
  Filled 2018-07-18 (×2): qty 1

## 2018-07-18 MED ORDER — IPRATROPIUM-ALBUTEROL 0.5-2.5 (3) MG/3ML IN SOLN
3.0000 mL | Freq: Four times a day (QID) | RESPIRATORY_TRACT | Status: DC
  Administered 2018-07-18 – 2018-07-19 (×4): 3 mL via RESPIRATORY_TRACT
  Filled 2018-07-18 (×5): qty 3

## 2018-07-18 MED ORDER — GABAPENTIN 600 MG PO TABS
1200.0000 mg | ORAL_TABLET | Freq: Three times a day (TID) | ORAL | Status: DC
  Administered 2018-07-18 – 2018-07-19 (×2): 1200 mg via ORAL
  Filled 2018-07-18 (×2): qty 2

## 2018-07-18 MED ORDER — BUDESONIDE 0.25 MG/2ML IN SUSP: 0.2500 mg | Freq: Two times a day (BID) | RESPIRATORY_TRACT | Status: DC

## 2018-07-18 MED ORDER — ASPIRIN EC 81 MG PO TBEC
81.0000 mg | DELAYED_RELEASE_TABLET | Freq: Every day | ORAL | Status: DC
  Administered 2018-07-19: 81 mg via ORAL
  Filled 2018-07-18 (×2): qty 1

## 2018-07-18 MED ORDER — IPRATROPIUM BROMIDE 0.02 % IN SOLN
0.5000 mg | Freq: Once | RESPIRATORY_TRACT | Status: AC
  Administered 2018-07-18: 0.5 mg via RESPIRATORY_TRACT
  Filled 2018-07-18: qty 2.5

## 2018-07-18 MED ORDER — SERTRALINE HCL 50 MG PO TABS: 150.0000 mg | ORAL_TABLET | Freq: Every day | ORAL | Status: DC

## 2018-07-18 MED ORDER — CITALOPRAM HYDROBROMIDE 10 MG PO TABS
10.0000 mg | ORAL_TABLET | Freq: Every day | ORAL | Status: DC
Start: 2018-07-18 — End: 2018-07-19
  Administered 2018-07-18 – 2018-07-19 (×2): 10 mg via ORAL
  Filled 2018-07-18 (×2): qty 1

## 2018-07-18 MED ORDER — METHYLPREDNISOLONE SODIUM SUCC 40 MG IJ SOLR
40.0000 mg | Freq: Once | INTRAMUSCULAR | Status: AC
  Administered 2018-07-18: 40 mg via INTRAVENOUS
  Filled 2018-07-18: qty 1

## 2018-07-18 MED ORDER — ENOXAPARIN SODIUM 30 MG/0.3ML ~~LOC~~ SOLN
30.0000 mg | Freq: Every day | SUBCUTANEOUS | Status: DC
  Administered 2018-07-18: 30 mg via SUBCUTANEOUS
  Filled 2018-07-18: qty 0.3

## 2018-07-18 MED ORDER — ALBUTEROL (5 MG/ML) CONTINUOUS INHALATION SOLN
10.0000 mg/h | INHALATION_SOLUTION | Freq: Once | RESPIRATORY_TRACT | Status: AC
  Administered 2018-07-18: 10 mg/h via RESPIRATORY_TRACT
  Filled 2018-07-18: qty 20

## 2018-07-18 MED ORDER — PANTOPRAZOLE SODIUM 40 MG PO TBEC: 40.0000 mg | DELAYED_RELEASE_TABLET | Freq: Every day | ORAL | Status: DC

## 2018-07-18 MED ORDER — GUAIFENESIN ER 600 MG PO TB12
600.0000 mg | ORAL_TABLET | Freq: Two times a day (BID) | ORAL | Status: DC
  Administered 2018-07-18: 600 mg via ORAL
  Filled 2018-07-18 (×2): qty 1

## 2018-07-18 MED ORDER — ONDANSETRON 4 MG PO TBDP
8.0000 mg | ORAL_TABLET | Freq: Three times a day (TID) | ORAL | Status: DC | PRN
  Administered 2018-07-18 (×2): 8 mg via ORAL
  Filled 2018-07-18 (×2): qty 2

## 2018-07-18 MED ORDER — FLUTICASONE PROPIONATE 50 MCG/ACT NA SUSP
2.0000 | Freq: Every day | NASAL | Status: DC | PRN
  Filled 2018-07-18: qty 16

## 2018-07-18 MED ORDER — LORAZEPAM 1 MG PO TABS
0.5000 mg | ORAL_TABLET | Freq: Three times a day (TID) | ORAL | Status: DC | PRN
  Administered 2018-07-18: 0.5 mg via ORAL
  Filled 2018-07-18: qty 1

## 2018-07-18 MED ORDER — AMLODIPINE BESYLATE 5 MG PO TABS
10.0000 mg | ORAL_TABLET | Freq: Every day | ORAL | Status: DC
  Administered 2018-07-18 – 2018-07-19 (×2): 10 mg via ORAL
  Filled 2018-07-18 (×2): qty 2

## 2018-07-18 MED ORDER — ACETAMINOPHEN 325 MG PO TABS
325.0000 mg | ORAL_TABLET | Freq: Four times a day (QID) | ORAL | Status: DC | PRN
  Administered 2018-07-18 (×2): 325 mg via ORAL
  Filled 2018-07-18: qty 1
  Filled 2018-07-18: qty 2

## 2018-07-18 MED ORDER — OXYCODONE HCL ER 20 MG PO T12A
20.0000 mg | EXTENDED_RELEASE_TABLET | Freq: Two times a day (BID) | ORAL | Status: DC
  Administered 2018-07-18 – 2018-07-19 (×2): 20 mg via ORAL
  Filled 2018-07-18 (×3): qty 1

## 2018-07-18 MED ORDER — METOCLOPRAMIDE HCL 10 MG PO TABS
10.0000 mg | ORAL_TABLET | Freq: Once | ORAL | Status: AC
  Administered 2018-07-18: 10 mg via ORAL
  Filled 2018-07-18: qty 1

## 2018-07-18 MED ORDER — ONDANSETRON HCL 4 MG/2ML IJ SOLN
INTRAMUSCULAR | Status: AC
  Administered 2018-07-18: 4 mg
  Filled 2018-07-18: qty 2

## 2018-07-18 MED ORDER — FOLIC ACID 1 MG PO TABS
1.0000 mg | ORAL_TABLET | Freq: Every day | ORAL | Status: DC
  Administered 2018-07-18 – 2018-07-19 (×2): 1 mg via ORAL
  Filled 2018-07-18 (×2): qty 1

## 2018-07-18 MED ORDER — NICOTINE 14 MG/24HR TD PT24: 14.0000 mg | MEDICATED_PATCH | Freq: Every day | TRANSDERMAL | Status: DC

## 2018-07-18 NOTE — Progress Notes (Signed)
Pt brought in by EMS on BIPAP transferred patient to our BIPAP.  Patient receiving 1 hour long CAT through BIPAP.  ABG obtained setting adjusted accordingly.

## 2018-07-18 NOTE — ED Notes (Signed)
States she is feeling better, able to speak in complete sentences.

## 2018-07-18 NOTE — ED Provider Notes (Signed)
[facility] EMERGENCY DEPARTMENT Provider Note   CSN: [id number] Arrival date & time: 07/18/18  0404     History   Chief Complaint No chief complaint on file.   HPI Pam Johnson is a 67 y.o. female.  The history is provided by the EMS personnel and the patient. The history is limited by the condition of the patient (Severe respiratory distress).  She has a history of COPD, hypertension, stroke with left hemiparesis, GERD and comes in with severe dyspnea.  EMS reports that they had been called out earlier in the evening because of difficulty breathing the patient refused transport and went to smoke a cigarette and got significantly worse.  EMS has given her magnesium, methylprednisolone, epinephrine, and 2 nebulizer treatments with albuterol and ipratropium.  She was transported to the ED on CPAP.  Patient is denying chest pain.  She states that she does want to be intubated if that is necessary.  Past Medical History:  Diagnosis Date  . Acid reflux   . COPD (chronic obstructive pulmonary disease) (HCC)   . Hypertension   . Sacral fracture (HCC)   . Spinal stenosis     Patient Active Problem List   Diagnosis Date Noted  . Malnutrition of moderate degree 04/07/2018  . Protein-calorie malnutrition, severe (HCC) 04/06/2018  . Altered mental status   . GERD (gastroesophageal reflux disease) 04/05/2018  . Depression 04/05/2018  . Left-sided weakness 04/05/2018  . Hyponatremia 04/05/2018  . Dysphagia, post-stroke   . Acute blood loss anemia   . Chronic obstructive pulmonary disease (HCC)   . Chronic pain syndrome   . Acute ischemic right MCA stroke (HCC) 03/24/2018  . 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(HCC)   . Essential hypertension   . 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 Pneumonia 10/18/2015    Past Surgical History:  Procedure Laterality Date  . ABDOMINAL HYSTERECTOMY    . CHOLECYSTECTOMY    .  HERNIA REPAIR    . IR ANGIO INTRA EXTRACRAN SEL COM CAROTID INNOMINATE UNI L MOD SED  03/22/2018  . IR ANGIO VERTEBRAL SEL SUBCLAVIAN INNOMINATE UNI R MOD SED  03/22/2018  . IR CT HEAD LTD  03/22/2018  . IR PERCUTANEOUS ART THROMBECTOMY/INFUSION INTRACRANIAL INC DIAG ANGIO  03/22/2018  . NISSEN FUNDOPLICATION    . RADIOLOGY WITH ANESTHESIA N/A 03/22/2018   Procedure: IR WITH ANESTHESIA;  Surgeon: Julieanne Cotton, MD;  Location: [facility] OR;  Service: Radiology;  Laterality: N/A;  . TOTAL HIP ARTHROPLASTY Right 11/2017     OB History   None      Home Medications    Prior to Admission medications   Medication Sig Start Date End Date Taking? Authorizing Provider  acetaminophen (TYLENOL) 325 MG tablet Take 1-2 tablets (325-650 mg total) by mouth every 6 (six) hours as needed for mild pain. 03/31/18   Love, Evlyn Kanner, PA-C  albuterol (PROVENTIL HFA;VENTOLIN HFA) 108 (90 BASE) MCG/ACT inhaler Inhale 2 puffs into the lungs every 6 (six) hours as needed for wheezing or shortness of breath.    [provider]  amLODipine (NORVASC) 10 MG tablet Take 1 tablet (10 mg total) by mouth daily. 04/01/18   Love, Evlyn Kanner, PA-C  aspirin 81 MG EC tablet Take 1 tablet (81 mg total) by mouth daily. 04/01/18   Love, Evlyn Kanner, PA-C  clopidogrel (PLAVIX) 75 MG tablet Take 1 tablet (75 mg total) by mouth daily. 04/01/18   Jacquelynn Cree, PA-C  docusate sodium (COLACE) 100 MG capsule Take 1 capsule (100 mg total) by mouth 2 (two) times daily. 03/31/18   Love, Evlyn Kanner, PA-C  fluticasone (FLONASE) 50 MCG/ACT nasal spray Place 2 sprays into both nostrils daily as needed for allergies or rhinitis.    [provider]  Fluticasone-Salmeterol (ADVAIR) 500-50 MCG/DOSE AEPB Inhale 1 puff into the lungs 2 (two) times daily.    [provider]  HYDROcodone-acetaminophen (NORCO) 7.5-325 MG tablet Take 1 tablet by mouth 2 (two) times daily as needed (breakthrough pain). 03/31/18   Love, Evlyn Kanner, PA-C  hydrocortisone  cream 1 % Apply 1 application topically 2 (two) times daily as needed for itching.    [provider]  Menthol-Methyl Salicylate (MUSCLE RUB) 10-15 % CREA Apply 1 application topically 4 (four) times daily. To knees 03/31/18   Love, Evlyn Kanner, PA-C  metoCLOPramide (REGLAN) 5 MG tablet Take 5 mg by mouth 4 (four) times daily.    [provider]  mirtazapine (REMERON) 7.5 MG tablet Take 1 tablet (7.5 mg total) by mouth at bedtime. 04/26/18   Love, Evlyn Kanner, PA-C  montelukast (SINGULAIR) 10 MG tablet Take 10 mg by mouth daily.     [provider]  Multiple Vitamin (MULTIVITAMIN WITH MINERALS) TABS tablet Take 1 tablet by mouth daily.    [provider]  nicotine (NICODERM CQ - DOSED IN MG/24 HOURS) 14 mg/24hr patch Place 1 patch (14 mg total) onto the skin daily. 04/01/18   Love, Evlyn Kanner, PA-C  nitroGLYCERIN (NITROSTAT) 0.4 MG SL tablet Place 0.4 mg under the tongue every 5 (five) minutes as needed for chest pain.    [provider]  olopatadine (PATANOL) 0.1 % ophthalmic solution Place 1 drop into both eyes 2 (two) times daily.    [provider]  oxyCODONE (OXYCONTIN) 10 mg 12 hr tablet Take 1 tablet (10 mg total) by mouth every 12 (twelve) hours. 03/31/18   Love, Evlyn Kanner, PA-C  pantoprazole (PROTONIX) 40 MG tablet Take 1 tablet (40 mg total) by mouth daily. 04/01/18   Love, Evlyn Kanner, PA-C  sertraline (ZOLOFT) 100 MG tablet Take 150 mg by mouth daily.    [provider]  tiotropium (SPIRIVA) 18 MCG inhalation capsule Place 18 mcg into inhaler and inhale daily.    [provider]  triamcinolone cream (KENALOG) 0.5 % Apply 1 application topically daily as needed (rash).    [provider]    Family History Family History  Problem Relation Age of Onset  . Hypertension Mother   . Hypertension Father     Social History Social History   Tobacco Use  . Smoking status: Current Every Day Smoker    Packs/day: 1.00    Types:  Cigarettes  . Smokeless tobacco: Never Used  Substance Use Topics  . Alcohol use: Yes    Comment: occ  . Drug use: No     Allergies   Acyclovir and related; Ciprofloxacin; Ciprofloxacin; Penicillins; Promethazine; and Morphine   Review of Systems Review of Systems  Unable to perform ROS: Acuity of condition     Physical Exam Updated Vital Signs BP (!) 108/57   Pulse (!) 102   Temp 98.5 F (36.9 C) (Rectal)   Resp 20   Ht 5\' 4"  (1.626 m)   Wt 48.5 kg   SpO2 91%   BMI 18.37 kg/m   Physical Exam  Nursing note and vitals reviewed.  Cachectic 67 year old female in severe respiratory distress, on  CPAP. Vital signs are significant for elevated blood pressure, heart rate, respiratory rate. Oxygen saturation is 91%, which is borderline low. Head is normocephalic and atraumatic. PERRLA, EOMI. Oropharynx is clear. Neck is nontender and supple without adenopathy or JVD. Back is nontender and there is no CVA tenderness. Lungs have markedly diminished airflow with very faint expiratory wheezes diffusely.  No rales or rhonchi are appreciated. Chest is nontender. Heart has regular rate and rhythm without murmur. Abdomen is soft, flat, nontender without masses or hepatosplenomegaly and peristalsis is normoactive. Extremities have no cyanosis or edema, full range of motion is present. Skin is warm and dry without rash. Neurologic: Awake and alert but somewhat anxious.  Left hemiparesis present.  ED Treatments / Results  Labs (all labs ordered are listed, but only abnormal results are displayed) Labs Reviewed  BASIC METABOLIC PANEL - Abnormal; Notable for the following components:      Result Value   CO2 20 (*)    Glucose, Bld 327 (*)    BUN 7 (*)    All other components within normal limits  BRAIN NATRIURETIC PEPTIDE - Abnormal; Notable for the following components:   B Natriuretic Peptide 586.7 (*)    All other components within normal limits  CBC WITH DIFFERENTIAL/PLATELET  - Abnormal; Notable for the following components:   Hemoglobin 11.9 (*)    RDW 19.9 (*)    Platelets 420 (*)    All other components within normal limits  I-STAT ARTERIAL BLOOD GAS, ED - Abnormal; Notable for the following components:   pH, Arterial 7.283 (*)    pCO2 arterial 55.5 (*)    pO2, Arterial 305.0 (*)    All other components within normal limits  I-STAT ARTERIAL BLOOD GAS, ED - Abnormal; Notable for the following components:   pO2, Arterial 60.0 (*)    All other components within normal limits  TROPONIN I    EKG EKG Interpretation  Date/Time:  Tuesday July 18 2018 04:08:40 EDT Ventricular Rate:  115 PR Interval:    QRS Duration: 79 QT Interval:  356 QTC Calculation: 493 R Axis:   63 Text Interpretation:  Sinus tachycardia Anteroseptal infarct, age indeterminate When compared with ECG of 04/06/2018, HEART RATE has increased Confirmed by Dione BoozeGlick, Corin Formisano ([id number]) on 07/18/2018 6:58:19 AM   Radiology Dg Chest Port 1 View  Result Date: 07/18/2018 CLINICAL DATA:  67 y/o  F; shortness of breath. EXAM: PORTABLE CHEST 1 VIEW COMPARISON:  03/23/2018 chest radiograph. Hyperinflated lungs with upper lobe emphysema compatible with COPD. FINDINGS: Hyperinflated lungs with upper lobe emphysema compatible with COPD. Normal cardiac silhouette given projection and technique. Calcific aortic atherosclerosis. Hiatal hernia. No consolidation, effusion, or pneumothorax identified. T12 compression deformity post kyphoplasty. Right proximal humerus rod fixation. No acute osseous abnormality identified. IMPRESSION: Stable findings of COPD. Hiatal hernia. Aortic atherosclerosis. No acute pulmonary process identified. Electronically Signed   By: Mitzi HansenLance  Furusawa-Stratton M.D.   On: 07/18/2018 05:37    Procedures Procedures  CRITICAL CARE Performed by: Dione Boozeavid Dalen [place] Total critical care time: 90 minutes Critical care time was exclusive of separately billable procedures and treating other  patients. Critical care was necessary to treat or prevent imminent or life-threatening deterioration. Critical care was time spent personally by me on the following activities: development of treatment plan with patient and/or surrogate as well as nursing, discussions with consultants, evaluation of patient's response to treatment, examination of patient, obtaining history from patient or surrogate, ordering and performing treatments and interventions, ordering and review of laboratory  studies, ordering and review of radiographic studies, pulse oximetry and re-evaluation of patient's condition.  Medications Ordered in ED Medications  albuterol (PROVENTIL,VENTOLIN) solution continuous neb (10 mg/hr Nebulization Given 07/18/18 0420)  ipratropium (ATROVENT) nebulizer solution 0.5 mg (0.5 mg Nebulization Given 07/18/18 0420)  ondansetron (ZOFRAN) 4 MG/2ML injection (4 mg  Given 07/18/18 0445)     Initial Impression / Assessment and Plan / ED Course  I have reviewed the triage vital signs and the nursing notes.  Pertinent labs & imaging results that were available during my care of the patient were reviewed by me and considered in my medical decision making (see chart for details).  COPD exacerbation.  She is transitioned to BiPAP with some improvement in her respiratory status although she is still using accessory muscles.  Chest x-ray is ordered as well as screening labs.  Old records are reviewed, and she had been admitted to [facility] with COPD exacerbation August 29-September 1.  Patient appears to be in significant distress and likely will need intubation.  However, she did show modest improvement when switching from CPAP to BiPAP.  Will check ABG to see if she is showing evidence of CO2 retention, she is started on continuous albuterol nebulizer treatment.  Will check chest x-ray as well as other screening labs.  5:16 AM ABGs did show some CO2 retention with mild respiratory acidosis.   However, patient's respiratory status has improved dramatically.  She is no longer using accessory muscles of respiration.  She is speaking in complete sentences and is requesting to come off of BiPAP.  We will give her a trial off of BiPAP.  Chest x-ray does not show any pneumonia or overt heart failure.  7:52 AM She is tolerating staying off of BiPAP very well.  ABG was repeated, and PCO2 has normalized.  BNP has come back somewhat elevated and she is given a dose of furosemide.  I suspect that her respiratory failure is related to both her COPD and some component of CHF.  Case is discussed with Dr. Crista Elliot of internal medicine teaching service who agrees to admit the patient.  Final Clinical Impressions(s) / ED Diagnoses   Final diagnoses:  COPD exacerbation (HCC)  Acute respiratory failure with hypercapnia (HCC)  Elevated brain natriuretic peptide (BNP) level    ED Discharge Orders    None       Dione Booze, MD 07/18/18 [phone]

## 2018-07-18 NOTE — ED Notes (Signed)
Patient sitting up in bed states she is much better , speaking on complete clear sentences, husband at bedside.

## 2018-07-18 NOTE — ED Notes (Signed)
Admitting MD at bedside.

## 2018-07-18 NOTE — ED Triage Notes (Addendum)
Patient presents to ed via [facility] states ems was called to her house earlier today for c/o sob was given a breathing treatment and she felt better so she refused transport. Per ems patients husband states she started smoking and sob progessively got worse. Patient was given Epip.3 Mag. 2 g , Duo neb. X 2 and ntg x 1 enroute.

## 2018-07-18 NOTE — ED Notes (Signed)
Husband at bedside.  

## 2018-07-18 NOTE — H&P (Addendum)
Date: 07/18/2018               Patient Name:  Pam Johnson MRN: [id number]  DOB: 10/11/1951 Age / Sex: 67 y.o., female   PCP: Lavell Islam, MD [[facility]]         Medical Service: Internal Medicine Teaching Service         Attending Physician: Dr. Burns Spain, MD    First Contact: Dr. Dortha Schwalbe  Pager: [phone]  Second Contact: Dr. 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   Pam Johnson awoke this morning to use the restroom at approximately 2am when she developed acute-onset dyspnea, wheezing and feeling weak. This  worsened over the next 10-15 minutes at which point her husband called EMS. She had a similar episode yesterday afternoon which she managed at home with breathing treatments and rest. This feels like her typical COPD exacerbations and admits to several admissions recently and actually is on Day 7/7 of a 'powerful antibiotic' and steroid taper prescribed by her PCP 1 week ago. She's unsure why she continues to have exacerbations but thinks it's related to her difficulty stopping smoking. She's able to go 1-2 days without a cigarrette but husband reports prompt symptom onset when she starts smoking again and hints she was likely smoking before symptom onset this morning. She reports adherence to inhalers and home O2. She admits to non-productive cough, fatigue and chest tightness. Her only complaint currently is nausea which is chronic and attributes this to a 'failed' Nissen  fundoplication. She denies chest pressure, productive cough, recent URI, dysuria, abdominal pain or diarrhea.   EMS report notes SBP 210/114, pulse 120, RR 26 and was saturating 96% on CPAP. She was given continuous albuterol nebulizer's, ipratropium and magnesium with some improvement. On arrival to the ED, temperature 98.5*, pulse 110, BP 154/101 and she was saturating 100% on BiPAP. ABG on arrival with pH 7.28, pCO2 55 and pO2 305. She was maintained on BiPAP with significant improvement in her respiratory status. Repeat ABG with pH 7.38, pCO2 41.7 and pO2 60 (off O2). CXR with stable COPD without edema or acute pulmonary process. BMET with bicarb of 20 and glucose 327. BNP 587. CBC without leukocytosis. EKG unchanged from prior without evidence for acute ischemia. IMTS was contacted for admission for management of COPD exacerbation.   Meds:  Current Meds  Medication Sig  . acetaminophen (TYLENOL) 325 MG tablet Take 1-2 tablets (325-650 mg total) by mouth every 6 (six) hours as needed for mild pain.  Marland Kitchen albuterol (PROVENTIL HFA;VENTOLIN HFA) 108 (90 BASE) MCG/ACT inhaler Inhale 2 puffs into the lungs every 6 (six) hours as needed for wheezing or shortness of breath.  Marland Kitchen aspirin 81 MG EC tablet Take 1 tablet (81 mg total) by mouth daily.  . CHANTIX STARTING MONTH PAK 0.5 MG X 11 & 1 MG X 42 tablet Take 0.5 mg by mouth See admin instructions. Take 0.5 mg  Once daily  For 3 days , then  increase to one 0.5 mg  Tab twice a day  . citalopram (CELEXA) 10 MG tablet Take 10 mg by mouth See admin instructions. Take 10 mg for 1 week then increase to 20 mg daily  . clopidogrel (PLAVIX) 75 MG tablet Take 1 tablet (75 mg total) by mouth daily.  . fluticasone (FLONASE) 50 MCG/ACT nasal spray Place 2 sprays into both nostrils daily as needed for allergies or rhinitis.  . Fluticasone-Salmeterol (ADVAIR) 500-50 MCG/DOSE AEPB Inhale 1 puff into the lungs 2 (two) times daily.  Marland Kitchen gabapentin (NEURONTIN) 600 MG tablet Take  1,200 mg by mouth 3 (three) times daily.  Marland Kitchen HYDROcodone-acetaminophen (NORCO) 7.5-325 MG tablet Take 1 tablet by mouth 2 (two) times daily as needed (breakthrough pain).  . LORazepam (ATIVAN) 0.5 MG tablet Take 0.5 mg by mouth every 8 (eight) hours as needed.  . meloxicam (MOBIC) 7.5 MG tablet Take 7.5 mg by mouth daily at 12 noon.  . mirtazapine (REMERON) 7.5 MG tablet Take 1 tablet (7.5 mg total) by mouth at bedtime. (Patient taking differently: Take 30 mg by mouth at bedtime. )  . montelukast (SINGULAIR) 10 MG tablet Take 10 mg by mouth daily.   . nitroGLYCERIN (NITROSTAT) 0.4 MG SL tablet Place 0.4 mg under the tongue every 5 (five) minutes as needed for chest pain.  Marland Kitchen olopatadine (PATANOL) 0.1 % ophthalmic solution Place 1 drop into both eyes 2 (two) times daily.  . ondansetron (ZOFRAN) 8 MG tablet Take 8 mg by mouth as needed.  Marland Kitchen oxyCODONE (OXYCONTIN) 10 mg 12 hr tablet Take 1 tablet (10 mg total) by mouth every 12 (twelve) hours. (Patient taking differently: Take 20 mg by mouth 3 (three) times daily. )  . tiotropium (SPIRIVA) 18 MCG inhalation capsule Place 18 mcg into inhaler and inhale daily.  Marland Kitchen triamcinolone cream (KENALOG) 0.5 % Apply 1 application topically daily as needed (rash).   Allergies: Allergies as of 07/18/2018 - Review Complete 07/18/2018  Allergen Reaction Noted  . Acyclovir and related Other (See Comments) 03/22/2018  . Ciprofloxacin Other (See Comments) 10/18/2015  . Ciprofloxacin Other (See Comments) 03/09/2016  . Penicillins Other (See Comments) 10/18/2015  . Promethazine Other (See Comments) 08/15/2013  . Morphine Nausea And Vomiting 11/23/2017   Past Medical History:  Diagnosis Date  . Acid reflux   . COPD (chronic obstructive pulmonary disease) (HCC)   . Hypertension   . Sacral fracture (HCC)   . Spinal stenosis    Past Surgical History:  Procedure Laterality Date  . ABDOMINAL HYSTERECTOMY    . CHOLECYSTECTOMY    . HERNIA REPAIR    . IR ANGIO INTRA  EXTRACRAN SEL COM CAROTID INNOMINATE UNI L MOD SED  03/22/2018  . IR ANGIO VERTEBRAL SEL SUBCLAVIAN INNOMINATE UNI R MOD SED  03/22/2018  . IR CT HEAD LTD  03/22/2018  . IR PERCUTANEOUS ART THROMBECTOMY/INFUSION INTRACRANIAL INC DIAG ANGIO  03/22/2018  . LEFT HEART CATH AND CORONARY ANGIOGRAPHY  2000   Non-obstructive CAD  . NISSEN FUNDOPLICATION    . RADIOLOGY WITH ANESTHESIA N/A 03/22/2018   Procedure: IR WITH ANESTHESIA;  Surgeon: Julieanne Cotton, MD;  Location: [facility] OR;  Service: Radiology;  Laterality: N/A;  . TOTAL HIP ARTHROPLASTY Right 11/2017   Family History: Both parents are deceased. Denied family history of lung disease.   Social History: She continues to smoke ~1 pack per day but is trying to quit. She admits to daily alcohol use, last drink yesterday evening (1 beer). Denied prior history of withdrawal  seizures or DTs. She lives at home with her husband Huel who seems supportive.   Review of Systems: A complete ROS was negative except as per HPI.   Physical Exam: Blood pressure (!) 108/57, pulse (!) 102, temperature 98.5 F (36.9 C), temperature source Rectal, resp. rate 20, height 5\' 4"  (1.626 m), weight 48.5 kg, SpO2 91 %. General: Frail, chronically-ill appearing caucasian female who appears older than stated age. Resting comfortably with HOB elevated 45*. NAD. Husband present.  HENT: Normocephalic, atraumatic. No perioral cyanosis or conjunctival pallor. +Conjunctival injection and somewhat dry mucous membranes. No posterior erythema or edema of posterior oropharynx.  Cardiovascular: Tachycardic. No murmur or rub appreciated. Pulmonary: Breathing unlabored on 2L via [place]. Lungs with appreciable air movement with mild scattered wheezes. No crackles. Abdomen: Soft, non-distended. Mild epigastric TTP. No guarding or rigidity. +bowel sounds.  Extremities: Thin. Warm, perfused. No significant peripheral edema BL LE.  Skin: No cyanosis, suspicious rashes.  Neuro: Strength and  sensation grossly intact on RUE and RLE. Weakness LUE, LLE -at baseline.  Psych: Mood normal and affect was mood congruent. Responds to questions appropriately.   EKG: personally reviewed my interpretation is sinus tachycardia with rate 115 bpm. Normal PR interval and QRS duration.  Unchanged Q-waves V1-V3.   CXR: personally reviewed my interpretation is: lung hyperinflation with diffuse reticular pattern. No pulmonary edema or consolidation. No large pleural effusion.   ECHO 02/2018: EF 50-55%. Inferior and septal hypokinesis. MV calcified anulus with mild regurgitation.   Assessment & Plan by Problem: Active Problems:   COPD with exacerbation (HCC)  Acute on Chronic Respiratory Failure with Hypoxemia and Hypercapnea COPD with Acute Exacerbation, GOLD-4 Tobacco use [name] presents with acute-onset dyspnea and wheezing consistent with prior COPD exacerbations. Initially with respiratory acidosis with hypercarbia which responded well to BiPAP. She's been admitted and/or treated outpatient several times over the past few weeks for the same with symptom improvement. Husband and patient feel her recurrent exacerbations are related to her difficulty with smoking cessation, noting abrupt onset of dyspnea after starting again. Chart review also shows some nonadherance to steroids as reason for her readmission earlier this month at OSH.  She responded well to initial management with BiPAP, magnesium, albuterol and ipratropium nebulizer's but still with some wheezing and diminished breath sounds. She is saturating well on her home O2. While BNP was elevated, I do not see evidence for volume overload presently. As today is Day 7/7 of antibiotics and steroid taper prescribed by her PCP, I'm not inclined to resume antibiotics however suspect she would benefit from a longer steroid taper and tobacco cessation. Her husband seems to be a good support system and has rid their home of tobacco products.  -S/p IV  Solumedrol 40mg  x1 in ED -Prednisone 40mg  tomorrow am; consider longer taper at discharge if appropriate -Scheduled duonebs and pulmicort with albuterol available prn -Singulair, flonase prn -Mucinex -Patient has an appointment with her primary pulmonologist tomorrow 9/25 in [place] -(FYI: Also has an appointment 10/1 with cardiology and 10/21 with PCP for HFU planning)  Alcohol Use Disorder Chronic Pain Polypharmacy Patient drinks beer daily with last drink yesterday evening. Denied prior withdrawal seizures or DT's. Patient does have tremor on exam but chronic and unchanged. Also on Lorazepam 0.5mg  TID prn, hydrocodone-APAP 7.5-325 mg #120/month, oxycodone ER 20mg  TID in addition to Mobic, Mirtazapine 30mg  QHS, gabapentin 1200mg  TID and Celexa 10mg . Reviewed [place] controlled substance database and she seems to be receiving Oxycodone, Norco and Lorazepam regularly.  -  CIWA -Continue Lorazepam prn and Oxycodone 20mg  (starting BID, usually takes TID), Mobic, Gabapentin, Mirtazapine and Celexa -Minimize sedating medications as able  Hyperglycemia Glucose >300 on arrival to ED. Has been on a prednisone taper outpatient but no recorded hyperglycemia to this degree in our system. Checking A1c. Monitoring CBG.  GERD with history of Nissen Fundoplication Complains of nausea but chronic and at baseline. Attributes chronic symptoms to 'failed' Nissen fundoplication and hiatal hernia. On Protonix, Reglan and Zofran prn at home which have been continued on admission.   History of Non-obstructive CAD (LHC 2000) HTN She denies any chest pain or chest pressure. EKG without acute ischemic changes. Will continue home ASA and Amlodipine  Diet: HH IVF: none DVT ppx: Lovenox Code: FULL  Dispo: Admit patient to Observation with expected length of stay less than 2 midnights.  SignedNoemi [place], DO 07/18/2018, 7:34 AM  Pager: [phone]

## 2018-07-18 NOTE — ED Notes (Signed)
Pure wick applied- pt placed in clean dry brief.

## 2018-07-18 NOTE — Progress Notes (Signed)
Pt went into respiratory distress after using commode.  Stated, "I can't breath now.  Please help me."  Respirations were at 30/minute.  Now recovered.

## 2018-07-18 NOTE — ED Notes (Signed)
Patient is breathing much better , Dr. Preston FleetingGlick at bedside.  Resp. At bedside Bipap removed.

## 2018-07-18 NOTE — Progress Notes (Signed)
Pt husband [phone]

## 2018-07-18 NOTE — ED Notes (Signed)
Family at bedside. 

## 2018-07-19 DIAGNOSIS — I10 Essential (primary) hypertension: Secondary | ICD-10-CM

## 2018-07-19 DIAGNOSIS — I2510 Atherosclerotic heart disease of native coronary artery without angina pectoris: Secondary | ICD-10-CM

## 2018-07-19 DIAGNOSIS — Z9981 Dependence on supplemental oxygen: Secondary | ICD-10-CM

## 2018-07-19 DIAGNOSIS — J9621 Acute and chronic respiratory failure with hypoxia: Secondary | ICD-10-CM | POA: Diagnosis not present

## 2018-07-19 DIAGNOSIS — F419 Anxiety disorder, unspecified: Secondary | ICD-10-CM

## 2018-07-19 DIAGNOSIS — J9622 Acute and chronic respiratory failure with hypercapnia: Secondary | ICD-10-CM

## 2018-07-19 DIAGNOSIS — Z8673 Personal history of transient ischemic attack (TIA), and cerebral infarction without residual deficits: Secondary | ICD-10-CM

## 2018-07-19 DIAGNOSIS — J441 Chronic obstructive pulmonary disease with (acute) exacerbation: Secondary | ICD-10-CM | POA: Diagnosis not present

## 2018-07-19 DIAGNOSIS — Z880 Allergy status to penicillin: Secondary | ICD-10-CM

## 2018-07-19 DIAGNOSIS — Z885 Allergy status to narcotic agent status: Secondary | ICD-10-CM

## 2018-07-19 DIAGNOSIS — G894 Chronic pain syndrome: Secondary | ICD-10-CM | POA: Diagnosis not present

## 2018-07-19 DIAGNOSIS — Z7951 Long term (current) use of inhaled steroids: Secondary | ICD-10-CM

## 2018-07-19 DIAGNOSIS — Z7952 Long term (current) use of systemic steroids: Secondary | ICD-10-CM

## 2018-07-19 DIAGNOSIS — Z79891 Long term (current) use of opiate analgesic: Secondary | ICD-10-CM

## 2018-07-19 DIAGNOSIS — Z66 Do not resuscitate: Secondary | ICD-10-CM

## 2018-07-19 DIAGNOSIS — Z888 Allergy status to other drugs, medicaments and biological substances status: Secondary | ICD-10-CM

## 2018-07-19 DIAGNOSIS — Z881 Allergy status to other antibiotic agents status: Secondary | ICD-10-CM

## 2018-07-19 DIAGNOSIS — Z79899 Other long term (current) drug therapy: Secondary | ICD-10-CM

## 2018-07-19 DIAGNOSIS — Z720 Tobacco use: Secondary | ICD-10-CM

## 2018-07-19 LAB — COMPREHENSIVE METABOLIC PANEL
ALT: 10 U/L (ref 0–44)
AST: 19 U/L (ref 15–41)
Albumin: 3.1 g/dL — ABNORMAL LOW (ref 3.5–5.0)
Alkaline Phosphatase: 67 U/L (ref 38–126)
Anion gap: 10 (ref 5–15)
BUN: 6 mg/dL — AB (ref 8–23)
CO2: 25 mmol/L (ref 22–32)
Calcium: 8.9 mg/dL (ref 8.9–10.3)
Chloride: 104 mmol/L (ref 98–111)
Creatinine, Ser: 0.56 mg/dL (ref 0.44–1.00)
GFR calc non Af Amer: >60 mL/min (ref >60)
GLUCOSE: 86 mg/dL (ref 70–99)
Potassium: 3.6 mmol/L (ref 3.5–5.1)
SODIUM: 139 mmol/L (ref 135–145)
Total Bilirubin: 0.5 mg/dL (ref 0.3–1.2)
Total Protein: 6.2 g/dL — ABNORMAL LOW (ref 6.5–8.1)

## 2018-07-19 LAB — CBC
HEMATOCRIT: 33.1 % — AB (ref 36.0–46.0)
Hemoglobin: 10.6 g/dL — ABNORMAL LOW (ref 12.0–15.0)
MCH: 28.6 pg (ref 26.0–34.0)
MCHC: 32 g/dL (ref 30.0–36.0)
MCV: 89.2 fL (ref 78.0–100.0)
PLATELETS: 361 10*3/uL (ref 150–400)
RBC: 3.71 MIL/uL — AB (ref 3.87–5.11)
RDW: 20 % — ABNORMAL HIGH (ref 11.5–15.5)
WBC: 7.8 10*3/uL (ref 4.0–10.5)

## 2018-07-19 LAB — GLUCOSE, CAPILLARY: Glucose-Capillary: 99 mg/dL (ref 70–99)

## 2018-07-19 MED ORDER — AMLODIPINE BESYLATE 10 MG PO TABS: 10.0000 mg | ORAL_TABLET | Freq: Every day | ORAL | 0 refills | Status: DC

## 2018-07-19 MED ORDER — DOCUSATE SODIUM 100 MG PO CAPS: 100.0000 mg | ORAL_CAPSULE | Freq: Two times a day (BID) | ORAL | 0 refills | Status: DC

## 2018-07-19 MED ORDER — PREDNISONE 20 MG PO TABS: 40.0000 mg | ORAL_TABLET | Freq: Every day | ORAL | 0 refills | Status: DC

## 2018-07-19 MED ORDER — PREDNISONE 20 MG PO TABS: 40.0000 mg | ORAL_TABLET | Freq: Every day | ORAL | 0 refills | Status: AC

## 2018-07-19 MED ORDER — PREDNISONE 20 MG PO TABS
40.0000 mg | ORAL_TABLET | Freq: Once | ORAL | Status: AC
  Administered 2018-07-19: 40 mg via ORAL
  Filled 2018-07-19: qty 2

## 2018-07-19 NOTE — Care Management Note (Signed)
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                Action/Plan: CM met with patient to discuss transitional needs. Patient lives at home with spouse who assists with ADLs and ambulates with a rollator. Patient verbalized having home O2 PTA, with spouse to provide portable tank for transport home. Patient indicated being open to [facility] for HHPT services. PCP confirmed as: Dr. Sherron Flemings, [place]; pharmacy of choice: [facility] No further needs from CM at this time.   Expected Discharge Date:  07/19/18               Expected Discharge Plan:  Home/Self Care  In-House Referral:  NA  Discharge planning Services  CM Consult  Post Acute Care Choice:  NA Choice offered to:  NA  DME Arranged:  N/A DME Agency:  NA  HH Arranged:  NA HH Agency:  NA  Status of Service:  Completed, signed off  If discussed at [place] of Stay Meetings, dates discussed:    Additional Comments:  Midge Minium RN, BSN, NCM-BC, ACM-RN [phone] 07/19/2018, 1:47 PM

## 2018-07-19 NOTE — Evaluation (Signed)
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 Pt presents to ED via EMS for evaluation of respiratory distress.   Clinical Impression  Pt admitted with above diagnosis. Pt currently with functional limitations due to the deficits listed below (see PT Problem List). Prior to baseline, patient using Rollator for mobility and receiving HHPT. Currently, ambulating 200 feet with supervision and Rollator. Gait abnormalities noted, especially with fatigue. Recommend continued HHPT to progress endurance, gait training, and strengthening. Pt will benefit from skilled PT to increase their independence and safety with mobility to allow discharge to the venue listed below.       Follow Up Recommendations Home health PT    Equipment Recommendations  None recommended by PT    Recommendations for Other Services       Precautions / Restrictions Precautions Precautions: Fall Restrictions Weight Bearing Restrictions: No      Mobility  Bed Mobility Overal bed mobility: Modified Independent                Transfers Overall transfer level: Needs assistance Equipment used: None Transfers: Sit to/from Stand Sit to Stand: Supervision         General transfer comment: supervision for safety  Ambulation/Gait Ambulation/Gait assistance: Supervision Gait Distance (Feet): 200 Feet Assistive device: 4-wheeled walker Gait Pattern/deviations: Step-through pattern;Trunk flexed;Decreased dorsiflexion - right;Decreased dorsiflexion - left   Gait velocity interpretation: 1.31 - 2.62 ft/sec, indicative of limited community ambulator General Gait Details: decreased bilateral foot  clearance. adequate speed using Rollator  Stairs            Wheelchair Mobility    Modified Rankin (Stroke Patients Only)       Balance Overall balance assessment: Needs assistance Sitting-balance support: No upper extremity supported;Feet supported Sitting balance-Leahy Scale: Good     Standing balance support: No upper extremity supported;During functional activity Standing balance-Leahy Scale: Fair Standing balance comment: Able to maintain standing balance but required Min A for functional mobility without UE support                             Pertinent Vitals/Pain Pain Assessment: Faces Faces Pain Scale: Hurts little more Pain Location: back Pain Descriptors / Indicators: Constant;Discomfort;Grimacing Pain Intervention(s): Monitored during session    Home Living Family/patient expects to be discharged to:: Private residence Living Arrangements: Spouse/significant other Available Help at Discharge: Family;Available 24 hours/day Type of Home: House Home Access: Stairs to enter Entrance Stairs-Rails: None Entrance Stairs-Number of Steps: 1 threshold step Home Layout: Two level;Able to live on main level with bedroom/bathroom Home Equipment: Walker - 4 wheels;Bedside commode;Shower seat;Cane - single point      Prior Function Level of Independence: Needs assistance   Gait / Transfers Assistance Needed: Uses a rollator at baseline  ADL's / Homemaking Assistance Needed: Pt able to perform BADLs with exception of bathing. Pt reporting her husband assists with bathing stating "I take a shower with him and he helps me stand up. We started doing that after my stroke." husband performs all IADLs.(Reports she has a shower chair but prefers the method of up)  Comments: Enjoys watching her programs including [facility] and cooking. Pt receiving HHPT  Hand Dominance   Dominant Hand: Left    Extremity/Trunk Assessment   Upper Extremity  Assessment Upper Extremity Assessment: Generalized weakness    Lower Extremity Assessment Lower Extremity Assessment: Generalized weakness    Cervical / Trunk Assessment Cervical / Trunk Assessment: Kyphotic  Communication   Communication: No difficulties  Cognition Arousal/Alertness: Awake/alert Behavior During Therapy: WFL for tasks assessed/performed Overall Cognitive Status: Within Functional Limits for tasks assessed                                 General Comments: Feel she is at baseline. WFL for tasks completed      General Comments General comments (skin integrity, edema, etc.): Session performed on 3L O2, unable to obtain SpO2     Exercises     Assessment/Plan    PT Assessment Patient needs continued PT services  PT Problem List Decreased strength;Decreased activity tolerance;Decreased balance;Decreased mobility;Cardiopulmonary status limiting activity       PT Treatment Interventions DME instruction;Gait training;Stair training;Functional mobility training;Therapeutic activities;Therapeutic exercise;Balance training;Patient/family education    PT Goals (Current goals can be found in the Care Plan section)  Acute Rehab PT Goals Patient Stated Goal: "Go home today" PT Goal Formulation: With patient Time For Goal Achievement: 08/02/18 Potential to Achieve Goals: Good    Frequency Min 3X/week   Barriers to discharge        Co-evaluation               AM-PAC PT "6 Clicks" Daily Activity  Outcome Measure Difficulty turning over in bed (including adjusting bedclothes, sheets and blankets)?: None Difficulty moving from lying on back to sitting on the side of the bed? : None Difficulty sitting down on and standing up from a chair with arms (e.g., wheelchair, bedside commode, etc,.)?: None Help needed moving to and from a bed to chair (including a wheelchair)?: A Little Help needed walking in hospital room?: A Little Help needed climbing  3-5 steps with a railing? : A Little 6 Click Score: 21    End of Session Equipment Utilized During Treatment: Oxygen Activity Tolerance: Patient tolerated treatment well Patient left: in bed;with call bell/phone within reach Nurse Communication: Mobility status PT Visit Diagnosis: Unsteadiness on feet (R26.81);Other abnormalities of gait and mobility (R26.89);Muscle weakness (generalized) (M62.81);Difficulty in walking, not elsewhere classified (R26.2)    Time: 1610-9604 PT Time Calculation (min) (ACUTE ONLY): 14 min   Charges:   PT Evaluation $PT Eval Moderate Complexity: 1 Mod         Laurina Bustle, [place], DPT Acute Rehabilitation Services Pager [phone] Office [phone]   Vanetta Mulders 07/19/2018, 1:15 PM

## 2018-07-19 NOTE — Care Management Obs Status (Signed)
MEDICARE OBSERVATION STATUS NOTIFICATION   Patient Details  Name: Pam Johnson MRN: [id number] Date of Birth: [birth year]   Medicare Observation Status Notification Given:  Yes    Colleen Can RN, BSN, NCM-BC, ACM-RN [phone] 07/19/2018, 12:02 PM

## 2018-07-19 NOTE — Discharge Summary (Signed)
Name: Pam Johnson MRN: [id number] DOB: Feb 24, 1951 67 y.o. PCP: Patient, No Pcp Per  Date of Admission: 07/18/2018  4:04 AM Date of Discharge: 07/19/2018 Attending Physician: Dr. Rogelia Boga   Discharge Diagnosis: 1. Acute on Chronic hypercapnic respiratory acidosis due to COPD exacerbation 2. Tobacco use 3. Chronic Pain Syndrome 4. Hypertension 5. Non-obstructive coronary artery disease   Discharge Medications: Allergies as of 07/19/2018      Reactions   Acyclovir And Related Other (See Comments)   unknown   Ciprofloxacin Other (See Comments)   Altered mental status.   Ciprofloxacin Other (See Comments)   Confused and nausea   Penicillins Other (See Comments)   Unknown reaction Has patient had a PCN reaction causing immediate rash, facial/tongue/throat swelling, SOB or lightheadedness with hypotension: NO Has patient had a PCN reaction causing severe rash involving mucus membranes or skin necrosis: NO Has patient had a PCN reaction that required hospitalization: NO Has patient had a PCN reaction occurring within the last 10 years: NO If all of the above answers are "NO", then may proceed with Cephalosporin use.   Promethazine Other (See Comments)   Was advised by her PCP not to take this medication.   Morphine Nausea And Vomiting      Medication List    TAKE these medications   acetaminophen 325 MG tablet Commonly known as:  TYLENOL Take 1-2 tablets (325-650 mg total) by mouth every 6 (six) hours as needed for mild pain.   albuterol 108 (90 Base) MCG/ACT inhaler Commonly known as:  PROVENTIL HFA;VENTOLIN HFA Inhale 2 puffs into the lungs every 6 (six) hours as needed for wheezing or shortness of breath.   amLODipine 10 MG tablet Commonly known as:  NORVASC Take 1 tablet (10 mg total) by mouth daily.   aspirin 81 MG EC tablet Take 1 tablet (81 mg total) by mouth daily.   CHANTIX STARTING MONTH PAK 0.5 MG X 11 & 1 MG X 42 tablet Generic drug:  varenicline Take  0.5 mg by mouth See admin instructions. Take 0.5 mg  Once daily  For 3 days , then increase to one 0.5 mg  Tab twice a day   citalopram 10 MG tablet Commonly known as:  CELEXA Take 10 mg by mouth See admin instructions. Take 10 mg for 1 week then increase to 20 mg daily   clopidogrel 75 MG tablet Commonly known as:  PLAVIX Take 1 tablet (75 mg total) by mouth daily.   docusate sodium 100 MG capsule Commonly known as:  COLACE Take 1 capsule (100 mg total) by mouth 2 (two) times daily.   fluticasone 50 MCG/ACT nasal spray Commonly known as:  FLONASE Place 2 sprays into both nostrils daily as needed for allergies or rhinitis.   Fluticasone-Salmeterol 500-50 MCG/DOSE Aepb Commonly known as:  ADVAIR Inhale 1 puff into the lungs 2 (two) times daily.   gabapentin 600 MG tablet Commonly known as:  NEURONTIN Take 1,200 mg by mouth 3 (three) times daily.   HYDROcodone-acetaminophen 7.5-325 MG tablet Commonly known as:  NORCO Take 1 tablet by mouth 2 (two) times daily as needed (breakthrough pain).   ipratropium-albuterol 0.5-2.5 (3) MG/3ML Soln Commonly known as:  DUONEB Inhale 3 mLs into the lungs every 6 (six) hours.   LORazepam 0.5 MG tablet Commonly known as:  ATIVAN Take 0.5 mg by mouth every 8 (eight) hours as needed.   meloxicam 7.5 MG tablet Commonly known as:  MOBIC Take 7.5 mg by mouth daily at  12 noon.   mirtazapine 7.5 MG tablet Commonly known as:  REMERON Take 1 tablet (7.5 mg total) by mouth at bedtime. What changed:  how much to take   MUSCLE RUB 10-15 % Crea Apply 1 application topically 4 (four) times daily. To knees What changed:    when to take this  reasons to take this   nitroGLYCERIN 0.4 MG SL tablet Commonly known as:  NITROSTAT Place 0.4 mg under the tongue every 5 (five) minutes as needed for chest pain.   olopatadine 0.1 % ophthalmic solution Commonly known as:  PATANOL Place 1 drop into both eyes 2 (two) times daily.   ondansetron 8 MG  tablet Commonly known as:  ZOFRAN Take 8 mg by mouth as needed.   oxyCODONE 10 mg 12 hr tablet Commonly known as:  OXYCONTIN Take 1 tablet (10 mg total) by mouth every 12 (twelve) hours. What changed:    how much to take  when to take this   predniSONE 20 MG tablet Commonly known as:  DELTASONE Take 2 tablets (40 mg total) by mouth daily with breakfast for 10 doses.   SINGULAIR 10 MG tablet Generic drug:  montelukast Take 10 mg by mouth daily.   tiotropium 18 MCG inhalation capsule Commonly known as:  SPIRIVA Place 18 mcg into inhaler and inhale daily.   triamcinolone cream 0.5 % Commonly known as:  KENALOG Apply 1 application topically daily as needed (rash).       Disposition and follow-up:   Pam Johnson was discharged from [facility] in [place] condition.  At the hospital follow up visit please address:  1.  Acute on Chronic hypercapnic respiratory acidosis due to COPD exacerbation: Given 10 day course of Prednisone 40mg . Please taper as needed.        Smoking cessation counseling highly advised.   2.  Labs / imaging needed at time of follow-up: None  3.  Pending labs/ test needing follow-up: None  Follow-up Appointments: Dr. Freda Jackson in [place] on 07/27/2018  Hospital Course by problem list: 1. Acute on Chronic hypercapnic respiratory acidosis 2/2 COPD exacerbation:Pam Johnson is a pleasant woman with COPD Gold Stage D on home supplemental oxygen 2L, current tobacco use disorder and EtOH use who presented to [facility] ED with acute onset dyspnea, wheezing and weakness. Her laboratory and image findings were consistent with COPD exacerbation. Her initial ABG was consistent with hypercapnic respiratory acidosis which improved after she was placed on BiPAP. She indicates of being recently started on prednisone and antibiotics by her PCP for COPD exacerbation. She had completed the antibiotics the day prior to admission and the steroids about a  week prior to admission.  Her symptoms significantly improved with 1 dose of IV solumedrol 40mg  and she was discharge with 10 days of oral prednisone 40mg  and was advised to have close follow up with PCP, Dr. Freda Jackson to initiate discussion for steroid taper.   She was also advised to continue home inhaler: Proair, advair, Spiriva with Atrovent nebs for rescue.  2.  She requests to be DNR DNI and states she has a living well and has discussed her wishes with her husband.  3. Tobacco Use disorder: She was counseled on tobacco cessation.   Discharge Vitals:   BP 137/75 (BP Location: Left Arm)   Pulse 68   Temp 97.7 F (36.5 C) (Oral)   Resp 20   Ht 5\' 4"  (1.626 m)   Wt 48.6 kg   SpO2  97%   BMI 18.39 kg/m   Pertinent Labs, Studies, and Procedures:  CXR  EXAM: PORTABLE CHEST 1 VIEW  COMPARISON:  03/23/2018 chest radiograph. Hyperinflated lungs with upper lobe emphysema compatible with COPD.  FINDINGS: Hyperinflated lungs with upper lobe emphysema compatible with COPD. Normal cardiac silhouette given projection and technique. Calcific aortic atherosclerosis. Hiatal hernia. No consolidation, effusion, or pneumothorax identified. T12 compression deformity post kyphoplasty. Right proximal humerus rod fixation. No acute osseous abnormality identified.  IMPRESSION: Stable findings of COPD. Hiatal hernia. Aortic atherosclerosis. No acute pulmonary process identified.  Discharge Instructions: Discharge Instructions    Call MD for:  difficulty breathing, headache or visual disturbances   Complete by:  As directed    Call MD for:  extreme fatigue   Complete by:  As directed    Call MD for:  persistant dizziness or light-headedness   Complete by:  As directed    Call MD for:  persistant nausea and vomiting   Complete by:  As directed    Call MD for:  temperature >100.4   Complete by:  As directed    Diet - low sodium heart healthy   Complete by:  As directed    Discharge  instructions   Complete by:  As directed    Ms. Colon,   It was a pleasure taking care of you here at the hospital. You were admitted because of a flare up of your COPD. We treated you with steroid and that relieved your symptoms.   I am going to give you a prescription for Prednisone 40mg , (total of 10 days). Please make an appointment and visit your primary care doctor in the next couple days to decide how long you will need to be on the steroids.   Also, continue using your home oxygen and have follow up with your pulmonologist.   ~Take Care Dr. Dortha Schwalbe   Increase activity slowly   Complete by:  As directed       Signed: Yvette Rack, MD 07/20/2018, 4:06 PM   Pager: [phone] IMTS PGY-1

## 2018-07-19 NOTE — Evaluation (Signed)
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 Pt presents to ED via EMS for evaluation of respiratory distress.     Clinical Impression   PTA, pt was living with her husband who assisted with bathing and IADLs; pt used rollator for functional mobility. Currently, pt requires Min Guard A for LB ADLs and Min A for functional mobility with single hand held A.  Pt presenting with decreased activity tolerance as seen by fatigues, dyspnea, and decreased SpO2 during ADLs and functional mobility. Pt dropping to 89% on 2L (normally wears 4L at home per pt preport); elevated to 3L during activity and pt SpO2 returning to 90s. Pt would benefit from further acute OT to facilitate safe dc. Recommend dc to home with 24/7 supervision and resume HHPT.    Follow Up Recommendations  No OT follow up;Supervision/Assistance - 24 hour;Other (comment)(Resume HHPT)    Equipment Recommendations  None recommended by OT    Recommendations for Other Services PT consult     Precautions / Restrictions Precautions Precautions: Fall Restrictions Weight Bearing Restrictions: No      Mobility Bed Mobility Overal bed mobility: Needs Assistance Bed Mobility: Supine to Sit     Supine to sit: Supervision     General bed mobility comments: supervision for safety  Transfers Overall transfer level: Needs assistance Equipment used: None Transfers: Sit to/from Stand Sit to Stand: Min guard         General transfer comment: Min Guard A for safety during sit<>stand. requiring Min A for functional mobility    Balance Overall balance assessment: Needs assistance Sitting-balance support: No  upper extremity supported;Feet supported Sitting balance-Leahy Scale: Good     Standing balance support: No upper extremity supported;During functional activity Standing balance-Leahy Scale: Fair Standing balance comment: Able to maintain standing balance but required Min A for functional mobility without UE support                           ADL either performed or assessed with clinical judgement   ADL Overall ADL's : Needs assistance/impaired Eating/Feeding: Supervision/ safety;Set up;Sitting Eating/Feeding Details (indicate cue type and reason): Pt able to prepare drink by opening soda can and pouring into glass. Pt also able to open gram cracker packet. Grooming: Min guard;Standing Grooming Details (indicate cue type and reason): Min Guard A for safety Upper Body Bathing: Set up;Supervision/ safety;Sitting   Lower Body Bathing: Min guard;Sit to/from stand   Upper Body Dressing : Set up;Supervision/safety;Sitting Upper Body Dressing Details (indicate cue type and reason): Donning second gown like jacket Lower Body Dressing: Min guard;Sit to/from stand Lower Body Dressing Details (indicate cue type and reason): Min Guard A for safety in standing. Pt donning underwear while sitting at EOB without difficulty Toilet Transfer: Min guard;Ambulation;Minimal assistance(Simulated to recliner) Toilet Transfer Details (indicate cue type and reason): Min Guard A for safety. Pt requiring Min A for funcitonal mobility due to decreased balance.         Functional mobility during ADLs: Minimal assistance General ADL Comments: Pt presenting with decreased activity tolerance as seen by fatigue, SOB, and decreased SpO2. SpO2 92% on 2L at begining of session. Increasing her O2 to 3L during functional  mobility in hallway due to SpO2 dropping to 89% and SOB. Pt SpO2 95% on 3L; notified RN.     Vision         Perception     Praxis      Pertinent Vitals/Pain Pain Assessment:  Faces Faces Pain Scale: Hurts little more Pain Location: back Pain Descriptors / Indicators: Constant;Discomfort;Grimacing Pain Intervention(s): Monitored during session;Limited activity within patient's tolerance;Repositioned     Hand Dominance Left   Extremity/Trunk Assessment Upper Extremity Assessment Upper Extremity Assessment: Generalized weakness   Lower Extremity Assessment Lower Extremity Assessment: Defer to PT evaluation   Cervical / Trunk Assessment Cervical / Trunk Assessment: Kyphotic   Communication Communication Communication: No difficulties   Cognition Arousal/Alertness: Awake/alert Behavior During Therapy: WFL for tasks assessed/performed Overall Cognitive Status: Within Functional Limits for tasks assessed                                 General Comments: Feel she is at baseline. WFL for tasks completed   General Comments  SpO2 dropping to 89% on 2L during activity    Exercises     Shoulder Instructions      Home Living Family/patient expects to be discharged to:: Private residence Living Arrangements: Spouse/significant other Available Help at Discharge: Family;Available 24 hours/day Type of Home: House Home Access: Stairs to enter [employer] of Steps: 1 threshold step Entrance Stairs-Rails: None Home Layout: Two level;Able to live on main level with bedroom/bathroom Alternate Level Stairs-Number of Steps: stays on main   Bathroom Shower/Tub: Tub/shower unit   [employer]: Standard(BSC over toilet)     Home Equipment: Environmental consultant - 4 wheels;Bedside commode;Shower seat;Cane - single point          Prior Functioning/Environment Level of Independence: Needs assistance  Gait / Transfers Assistance Needed: Uses a rollator at baseline ADL's / Homemaking Assistance Needed: Pt able to perform BADLs with exception of bathing. Pt reporting her husband assists with bathing stating "I take a shower with him and he helps me  stand up. We started doing that after my stroke." husband performs all IADLs.(Reports she has a shower chair but prefers the method of up)   Comments: Enjoys watching her programs including [facility]. Pt receiving HHPT        OT Problem List: Decreased strength;Decreased range of motion;Decreased activity tolerance;Impaired balance (sitting and/or standing);Decreased knowledge of use of DME or AE;Decreased knowledge of precautions;Pain;Cardiopulmonary status limiting activity      OT Treatment/Interventions: Self-care/ADL training;Therapeutic exercise;Energy conservation;DME and/or AE instruction;Therapeutic activities;Patient/family education    OT Goals(Current goals can be found in the care plan section) Acute Rehab OT Goals Patient Stated Goal: "Go home today" OT Goal Formulation: With patient Time For Goal Achievement: 08/02/18 Potential to Achieve Goals: Good ADL Goals Pt Will Perform Grooming: with modified independence;standing Pt Will Perform Lower Body Dressing: with modified independence;sit to/from stand Pt Will Transfer to Toilet: with modified independence;ambulating;bedside commode Pt Will Perform Toileting - Clothing Manipulation and hygiene: with modified independence;sit to/from stand Additional ADL Goal #1: Pt will verbalize three energy conservation techniques for ADLs with 2-3 VCs  OT Frequency: Min 2X/week   Barriers to D/C:            Co-evaluation              AM-PAC PT "6 Clicks" Daily Activity     Outcome Measure Help from another person eating meals?: None Help from  another person taking care of personal grooming?: A Little Help from another person toileting, which includes using toliet, bedpan, or urinal?: A Little Help from another person bathing (including washing, rinsing, drying)?: A Little Help from another person to put on and taking off regular upper body clothing?: None Help from another person to put on and taking off regular  lower body clothing?: A Little 6 Click Score: 20   End of Session Equipment Utilized During Treatment: Gait belt;Oxygen Nurse Communication: Mobility status;Other (comment)(elevating to 3L O2)  Activity Tolerance: Patient tolerated treatment well;Patient limited by fatigue Patient left: in chair;with call bell/phone within reach  OT Visit Diagnosis: Unsteadiness on feet (R26.81);Other abnormalities of gait and mobility (R26.89);Muscle weakness (generalized) (M62.81);Pain Pain - part of body: (Back)                Time: 1610-9604 OT Time Calculation (min): 22 min Charges:  OT General Charges $OT Visit: 1 Visit OT Evaluation $OT Eval Low Complexity: 1 Low  Tyreka Henneke MSOT, OTR/L Acute Rehab Pager: [phone] Office: [phone]  Theodoro Grist Medha Pippen 07/19/2018, 10:16 AM

## 2018-07-19 NOTE — Plan of Care (Signed)
Pt verbalized understanding plan of care. Pt improving with respiratory interventions. Pt nausea being effectively managed. Pain/anxiety also being effectively managed. Pt able to ambulate in room with some assistance, denies SOB with activity. Pt progressing towards discharge.

## 2018-07-19 NOTE — Progress Notes (Signed)
Pt refusing blood sugar  

## 2018-07-19 NOTE — Progress Notes (Addendum)
   Subjective:  Overnight: No acute events.   Today, Ms. Birdsell was examined and evaluated at bedside and states she feels good. She was been able to walk down the hallway with OT yesterday. Had some dyspnea but she states this is normal. She mentions she had some productive cough this morning with yellow sputum and was observed doing the same during examination but this is also chronic for her. She describes being on antibiotics for 7 days and completed this regimen prior to admission. She was on chronic prednisone but had ran out on last Saturday. Her breathing got worse after that on Sunday and Monday. For her home inhaler, was on proair, advair, Spiriva with Atrovent nebs for rescue. She is chronically on 2L [place] at home but required an increase to 4L prior to admission. She states overall she feels much improved compared to yesterday and currently denies Fever and Chills. I explained to the pt about re-starting steroids and discharge later today as well as emphasizing the importance of quitting cigarette use. She expressed understanding of current plan. She clarifies that she knows she has to quit cigarettes and mentions she will try her best to quit again. When asked about Living Will, she mentions she has one in [facility] and would never want to be intubated or breathing machine. Also mentions she would like to be DNR.   Objective:  Vital signs in last 24 hours: Vitals:   07/19/18 0206 07/19/18 0500 07/19/18 0552 07/19/18 1123  BP:   118/69 122/71  Pulse:   74 75  Resp:   16 18  Temp:   98.4 F (36.9 C) 97.9 F (36.6 C)  TempSrc:   Oral Oral  SpO2: 95%  94% 97%  Weight:  48.6 kg    Height:       Const: In NAD, sitting comfortably on recliner  CV: RRR, no wheezes, crackles, rhonchi  Resp: Diffuse wheezes and crackles anteriorly and posteriorly Abd: BS+, NTTO Ext: No pitting edema   Assessment/Plan:  Active Problems:   COPD exacerbation (HCC)  Ms. Heying is a pleasant woman with  COPD Gold Stage D on home supplemental oxygen 2L, current tobacco use disorder and EtOH use who presented to [facility] ED with acute onset dyspnea, wheezing and weakness. Her laboratory and image findings were consistent with COPD exacerbation. Her initial ABG was consistent with hypercapnic respiratory acidosis which improved after she was placed on BiPAP. She indicates of being recently started on prednisone and antibiotics by her PCP for COPD exacerbation. She had completed the antibiotics the day prior to admission and the steroids about a week prior to admission. The etiology of her current exacerbation is most likely from a non-infectious source but rather continual use of tobacco products.   Acute on Chronic hypercapnic respiratory acidosis 2/2 COPD exacerbation: Denies dyspnea this morning and has actually been able to ambulate the hallway with occupational therapy without any difficulties. Her O2 saturation this AM was 97% on 3L [place]. She has improved clinically with 1 dose of IV solumedrol 40mg  and the plan is to discharge her with 10 days of oral prednisone 40mg  and have close follow up with PCP to intimate discussion for steroid taper. She is stable to discharge and continue home inhalers and supplemental oxygen.   Dispo: Anticipated discharge in approximately today after receiving prednisone.   Yvette Rack, MD 07/19/2018, 11:56 AM Pager: [phone] IMTS PGY-1

## 2018-07-19 NOTE — Progress Notes (Signed)
  Date: 07/19/2018  Patient name: Pam Johnson  Medical record number: [id number]  Date of birth: 02-18-1951   I have seen and evaluated Pam Johnson and discussed their care with the Residency Team.  Pam Johnson is a 67 year old community dwelling female with chronic hypoxic oxygen requiring respiratory failure secondary to COPD Gold stage D.  Her outpatient PCP had her on a prednisone taper which ended the 21st.  She also was on antibiotics with the last dose scheduled the day of admission.  She started getting dyspnea, wheezing, and generalized weakness 1 to 2 days prior to admission.  She states that she is still smoking and knows that she needs to quit but has had difficulty doing so even though she tried Chantix and patches.  This morning, she states she feels back to baseline and was able to work with therapy who recommended no further home therapy.  She would like to return home.  On direct questioning, she states she has a living will on file at [facility] and that she has discussed her wishes with her husband.  She volunteered the information that she never wanted to be intubated.  She does not want to be on a machine to keep her alive.  On direct questioning, she stated she would not want CPR or defibrillation.  PMHx, Fam Hx, and/or Soc Hx : Chronic pain on chronic opioids, hypertension, chronic anxiety on benzos, CAD, CVA.  Vitals:   07/19/18 0552 07/19/18 1123  BP: 118/69 122/71  Pulse: 74 75  Resp: 16 18  Temp: 98.4 F (36.9 C) 97.9 F (36.6 C)  SpO2: 94% 97%  O2 sat mid to high 90s on 2 L O2 nasal cannula GEN : NAD, no overt respiratory distress HRRR no MRG L decent airflow but expiratory wheezes anteriorly and laterally throughout with increased expiratory phase  I personally viewed the CXR images and confirmed my reading with the official read.  AP portable, hardware in the right humerus, overinflated, increased markings in the bases bilaterally.  I personally viewed the  EKG and confirmed my reading with the official read.  Sinus tachycardia, normal axis, normal intervals, no ischemic changes  Assessment and Plan: I have seen and evaluated the patient as outlined above. I agree with the formulated Assessment and Plan as detailed in the residents' note, with the following changes: Pam Johnson is a 67 year old community dwelling female with chronic hypoxic oxygen requiring respiratory failure secondary to COPD Gold stage D.  She was admitted with an exacerbation of her COPD after she completed a steroid taper.  With resumption of steroids, her symptoms resolved and she is back to baseline.  We were unable to find any other trigger, like a pneumonia or pulmonary edema, for her exacerbation.  She is stable to be discharged home on a slow prednisone taper.  She will need follow-up with her PCP.  1.  Acute on chronic hypoxic oxygen requiring respiratory failure secondary to COPD exacerbation -continue steroid taper at discharge.  Continue home inhalers.  She has completed her antibiotic course and there is no need for further antibiotic therapy.  She will follow-up with her PCP.  2.  She requests to be DNR DNI and states she has a living well and has discussed her wishes with her husband.  Burns Spain, MD 9/25/201912:46 PM

## 2019-03-04 IMAGING — CT CT ABD-PELV W/ CM
2 of 5 series · 16 of 46 positions shown, 18 images · IV contrast (Omni 300)
Comparison: None.

CLINICAL DATA: Acute abdominal pain

EXAM:
CT ABDOMEN AND PELVIS WITH CONTRAST
TECHNIQUE: Multidetector CT imaging of the abdomen and pelvis was performed
using the standard protocol following bolus administration of
intravenous contrast.
CONTRAST:  100mL OMNIPAQUE IOHEXOL 300 MG/ML  SOLN

[Series 3: a/p w/ 5mm · axial · 0.75mm/px · z∈[+759,+1099]mm · 13 of 78 slices shown, 15 images]
[im 5/78  soft-tissue]
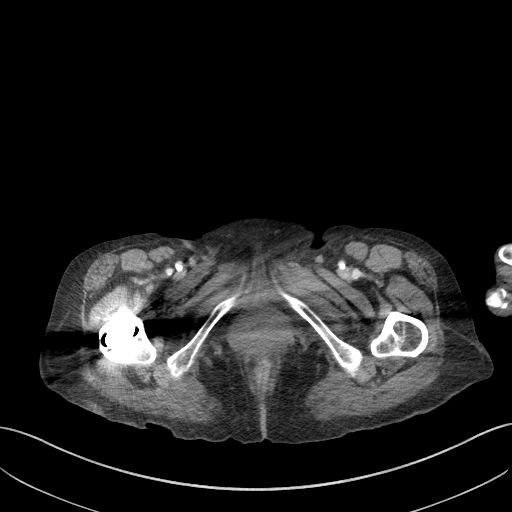
[im 5/78  bone]
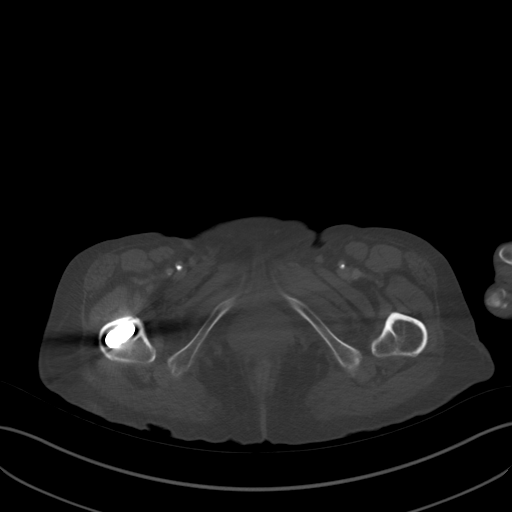
[im 13/78  soft-tissue]
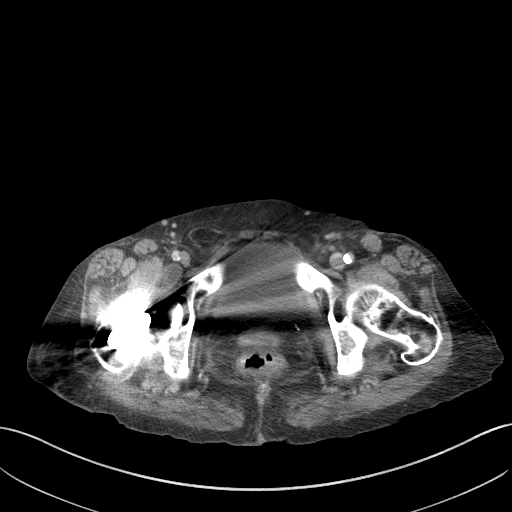
[im 17/78  soft-tissue]
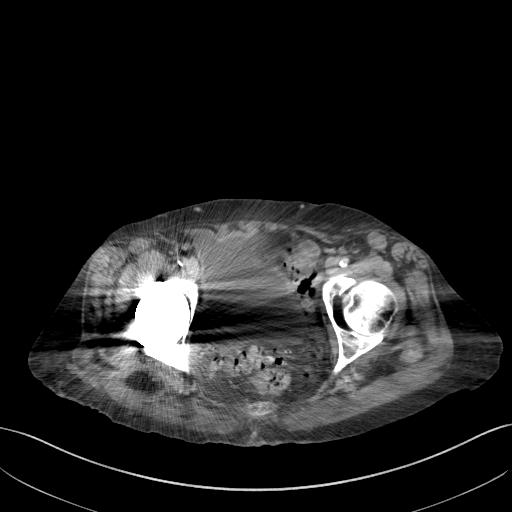
[im 21/78  soft-tissue]
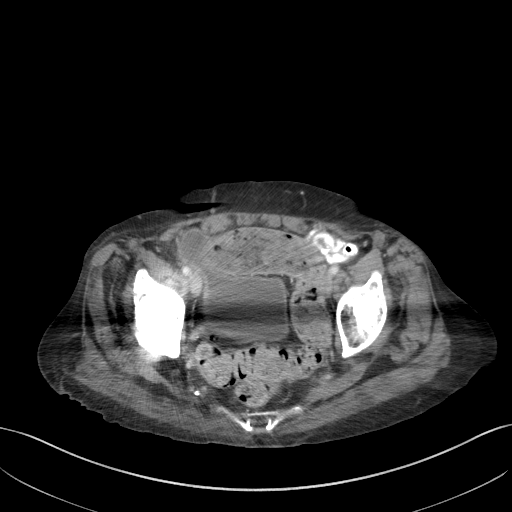
[im 29/78  soft-tissue]
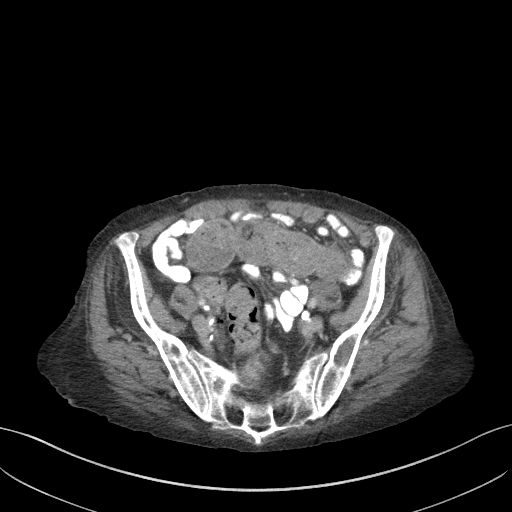
[im 33/78  soft-tissue]
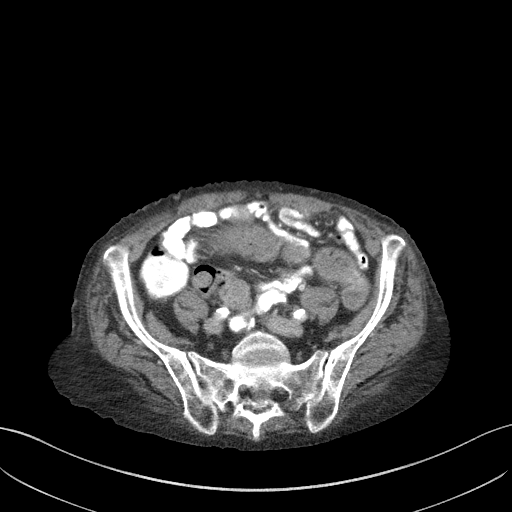
[im 41/78  soft-tissue]
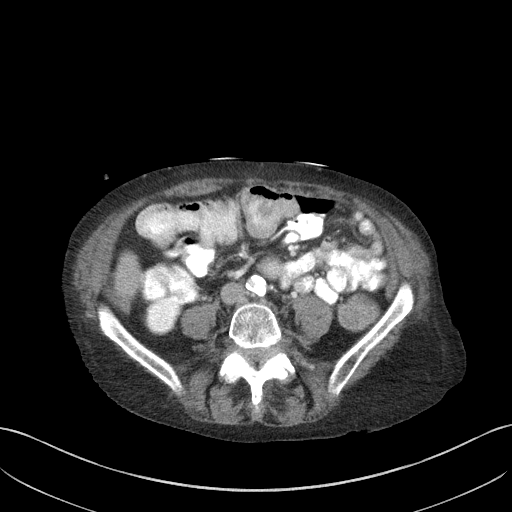
[im 45/78  soft-tissue]
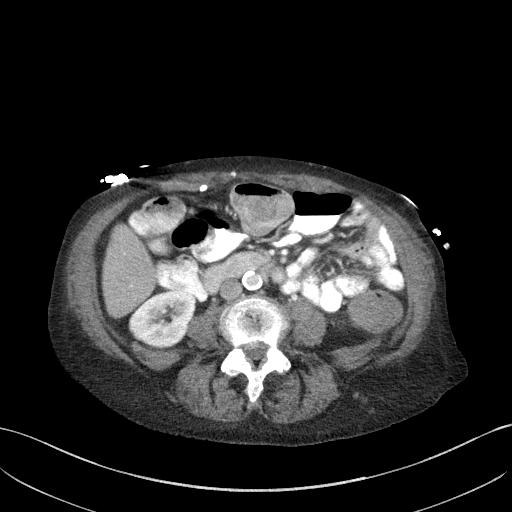
[im 49/78  soft-tissue]
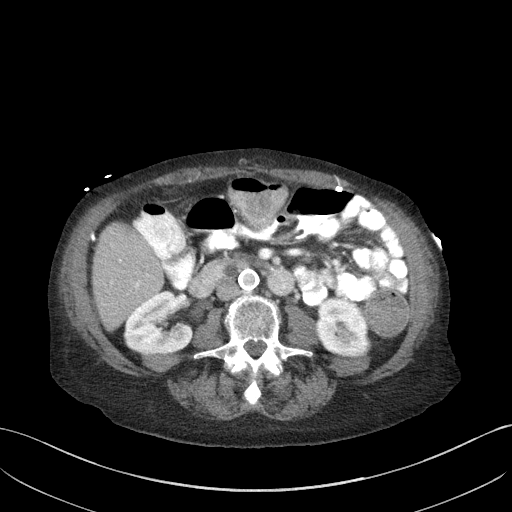
[im 49/78  bone]
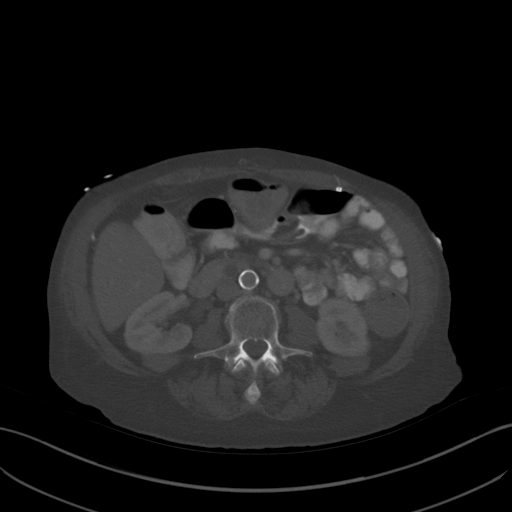
[im 57/78  soft-tissue]
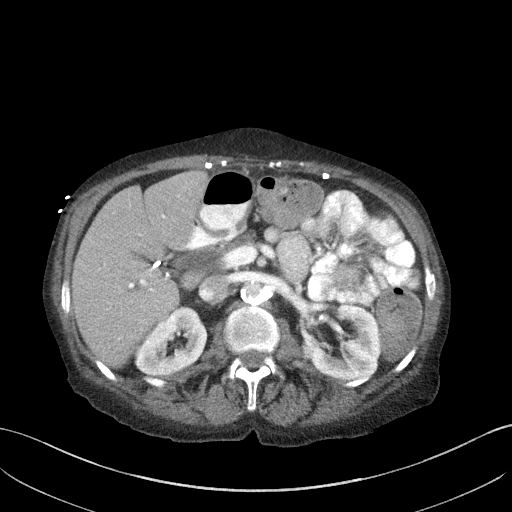
[im 61/78  soft-tissue]
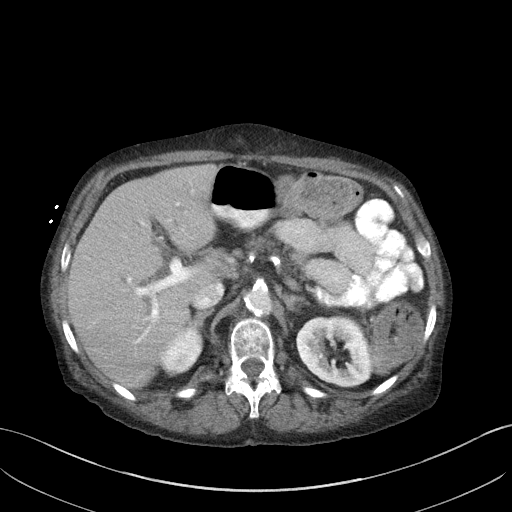
[im 65/78  soft-tissue]
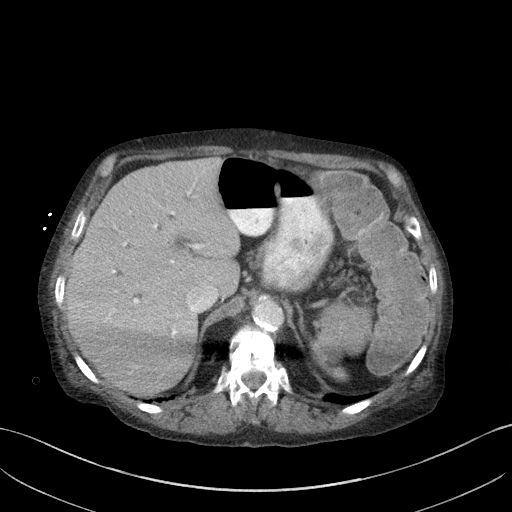
[im 73/78  soft-tissue]
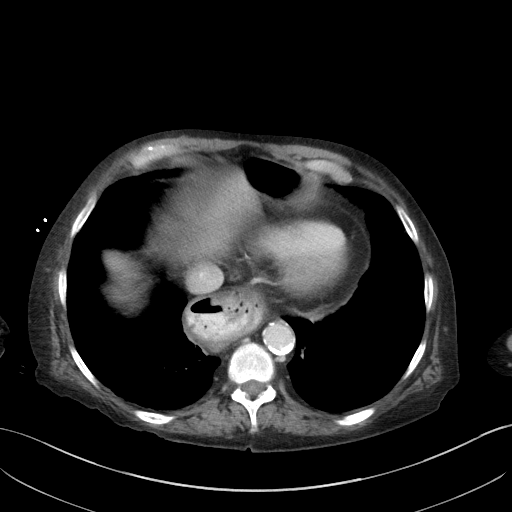

[Series 6: a/p w/ cor · coronal · 0.66mm/px · 3 of 122 slices shown]
[im 41/122  soft-tissue]
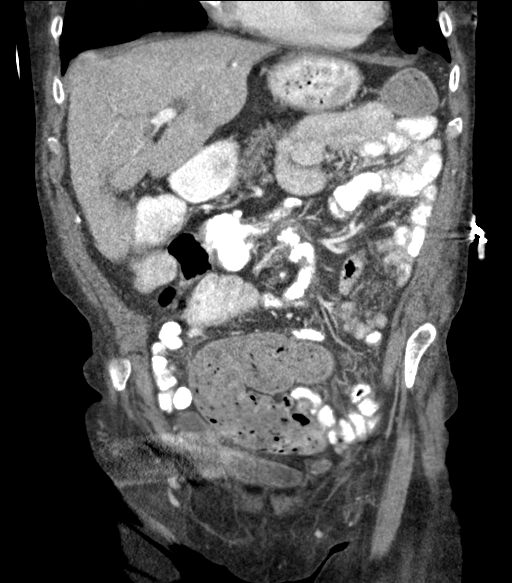
[im 54/122  soft-tissue]
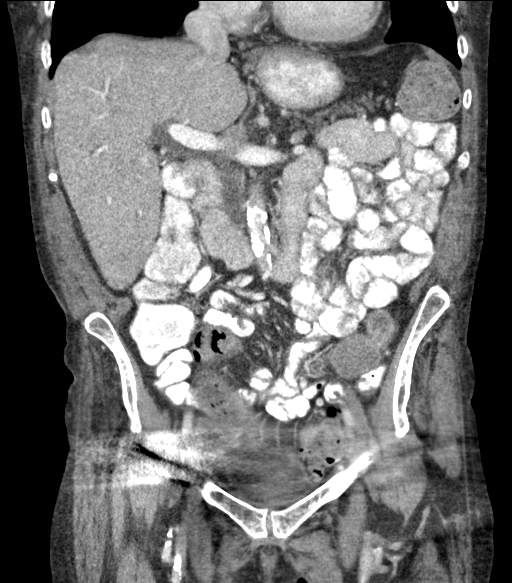
[im 68/122  soft-tissue]
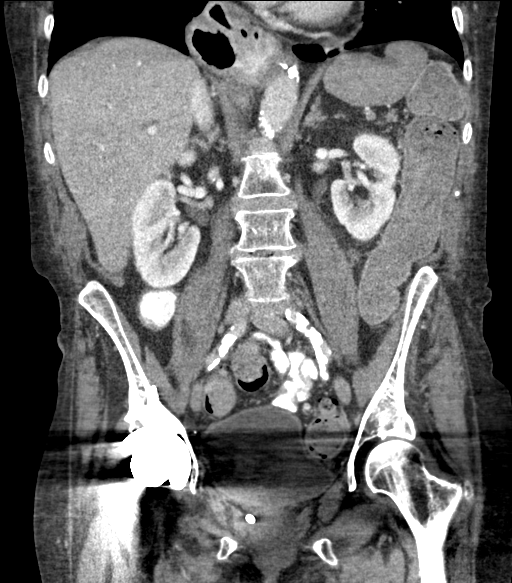

[16 of 46 positions shown; findings below may reference images not displayed]

FINDINGS: LOWER CHEST: No basilar pulmonary nodules or pleural effusion. No
apical pericardial effusion.

HEPATOBILIARY: Normal hepatic contours and density. Dilated common
bile duct is compatible with post cholecystectomy status.. Status
post cholecystectomy.

PANCREAS: Normal parenchymal contours without ductal dilatation. No
peripancreatic fluid collection.

SPLEEN: Normal.

ADRENALS/URINARY TRACT:

--Adrenal glands: Normal.

--Right kidney/ureter: No hydronephrosis, nephroureterolithiasis,
perinephric stranding or solid renal mass.

--Left kidney/ureter: No hydronephrosis, nephroureterolithiasis,
perinephric stranding or solid renal mass.

--Urinary bladder: Normal for degree of distention

STOMACH/BOWEL:

--Stomach/Duodenum: Medium-sized hiatal hernia. Normal course of the
duodenum.

--Small bowel: No dilatation or inflammation.

--Colon: No focal abnormality.

--Appendix: Not visualized. No right lower quadrant inflammation or
free fluid.

VASCULAR/LYMPHATIC: Atherosclerotic calcification is present within
the non-aneurysmal abdominal aorta, without hemodynamically
significant stenosis. The portal vein, splenic vein, superior
mesenteric vein and IVC are patent. No abdominal or pelvic
lymphadenopathy.

REPRODUCTIVE: Status post hysterectomy. No adnexal mass.

MUSCULOSKELETAL. No bony spinal canal stenosis or focal osseous
abnormality. Right total hip arthroplasty.

OTHER: None.
IMPRESSION: 1. No acute abnormality of the abdomen or pelvis.
2. Medium-sized hiatal hernia.
3. Aortic Atherosclerosis ([id number]).

## 2020-04-08 ENCOUNTER — Encounter (HOSPITAL_COMMUNITY): Payer: Self-pay

## 2020-04-08 ENCOUNTER — Inpatient Hospital Stay (HOSPITAL_COMMUNITY)
Admission: EM | Admit: 2020-04-08 | Discharge: 2020-04-10 | DRG: 190 | Disposition: A | Discharge status: Home Health | Payer: Medicare Other | Attending: Internal Medicine | Admitting: Internal Medicine

## 2020-04-08 ENCOUNTER — Inpatient Hospital Stay (HOSPITAL_COMMUNITY): Payer: Medicare Other

## 2020-04-08 ENCOUNTER — Other Ambulatory Visit: Payer: Self-pay

## 2020-04-08 ENCOUNTER — Emergency Department (HOSPITAL_COMMUNITY): Payer: Medicare Other

## 2020-04-08 DIAGNOSIS — R627 Adult failure to thrive: Secondary | ICD-10-CM | POA: Diagnosis present

## 2020-04-08 DIAGNOSIS — R64 Cachexia: Secondary | ICD-10-CM | POA: Diagnosis present

## 2020-04-08 DIAGNOSIS — I110 Hypertensive heart disease with heart failure: Secondary | ICD-10-CM | POA: Diagnosis present

## 2020-04-08 DIAGNOSIS — I160 Hypertensive urgency: Secondary | ICD-10-CM | POA: Diagnosis present

## 2020-04-08 DIAGNOSIS — Z66 Do not resuscitate: Secondary | ICD-10-CM | POA: Diagnosis present

## 2020-04-08 DIAGNOSIS — Z9981 Dependence on supplemental oxygen: Secondary | ICD-10-CM

## 2020-04-08 DIAGNOSIS — Z90710 Acquired absence of both cervix and uterus: Secondary | ICD-10-CM

## 2020-04-08 DIAGNOSIS — K219 Gastro-esophageal reflux disease without esophagitis: Secondary | ICD-10-CM | POA: Diagnosis present

## 2020-04-08 DIAGNOSIS — F17210 Nicotine dependence, cigarettes, uncomplicated: Secondary | ICD-10-CM | POA: Diagnosis present

## 2020-04-08 DIAGNOSIS — Z79899 Other long term (current) drug therapy: Secondary | ICD-10-CM

## 2020-04-08 DIAGNOSIS — I509 Heart failure, unspecified: Secondary | ICD-10-CM | POA: Diagnosis present

## 2020-04-08 DIAGNOSIS — Z96641 Presence of right artificial hip joint: Secondary | ICD-10-CM | POA: Diagnosis present

## 2020-04-08 DIAGNOSIS — J9621 Acute and chronic respiratory failure with hypoxia: Secondary | ICD-10-CM | POA: Diagnosis present

## 2020-04-08 DIAGNOSIS — G894 Chronic pain syndrome: Secondary | ICD-10-CM | POA: Diagnosis present

## 2020-04-08 DIAGNOSIS — Z7982 Long term (current) use of aspirin: Secondary | ICD-10-CM | POA: Diagnosis not present

## 2020-04-08 DIAGNOSIS — G8929 Other chronic pain: Secondary | ICD-10-CM | POA: Diagnosis present

## 2020-04-08 DIAGNOSIS — D72829 Elevated white blood cell count, unspecified: Secondary | ICD-10-CM | POA: Diagnosis present

## 2020-04-08 DIAGNOSIS — R0603 Acute respiratory distress: Secondary | ICD-10-CM

## 2020-04-08 DIAGNOSIS — F17200 Nicotine dependence, unspecified, uncomplicated: Secondary | ICD-10-CM | POA: Diagnosis present

## 2020-04-08 DIAGNOSIS — R0902 Hypoxemia: Secondary | ICD-10-CM

## 2020-04-08 DIAGNOSIS — R911 Solitary pulmonary nodule: Secondary | ICD-10-CM | POA: Diagnosis present

## 2020-04-08 DIAGNOSIS — J441 Chronic obstructive pulmonary disease with (acute) exacerbation: Principal | ICD-10-CM | POA: Diagnosis present

## 2020-04-08 DIAGNOSIS — Z20822 Contact with and (suspected) exposure to covid-19: Secondary | ICD-10-CM | POA: Diagnosis present

## 2020-04-08 DIAGNOSIS — Z681 Body mass index (BMI) 19 or less, adult: Secondary | ICD-10-CM | POA: Diagnosis not present

## 2020-04-08 DIAGNOSIS — J9620 Acute and chronic respiratory failure, unspecified whether with hypoxia or hypercapnia: Secondary | ICD-10-CM | POA: Insufficient documentation

## 2020-04-08 DIAGNOSIS — I161 Hypertensive emergency: Secondary | ICD-10-CM

## 2020-04-08 DIAGNOSIS — R0602 Shortness of breath: Secondary | ICD-10-CM

## 2020-04-08 DIAGNOSIS — I69354 Hemiplegia and hemiparesis following cerebral infarction affecting left non-dominant side: Secondary | ICD-10-CM | POA: Diagnosis not present

## 2020-04-08 DIAGNOSIS — Z7902 Long term (current) use of antithrombotics/antiplatelets: Secondary | ICD-10-CM | POA: Diagnosis not present

## 2020-04-08 DIAGNOSIS — L8990 Pressure ulcer of unspecified site, unspecified stage: Secondary | ICD-10-CM | POA: Diagnosis present

## 2020-04-08 DIAGNOSIS — I2510 Atherosclerotic heart disease of native coronary artery without angina pectoris: Secondary | ICD-10-CM | POA: Diagnosis present

## 2020-04-08 DIAGNOSIS — E43 Unspecified severe protein-calorie malnutrition: Secondary | ICD-10-CM | POA: Diagnosis present

## 2020-04-08 DIAGNOSIS — Z7951 Long term (current) use of inhaled steroids: Secondary | ICD-10-CM

## 2020-04-08 DIAGNOSIS — L89523 Pressure ulcer of left ankle, stage 3: Secondary | ICD-10-CM | POA: Diagnosis present

## 2020-04-08 DIAGNOSIS — R062 Wheezing: Secondary | ICD-10-CM

## 2020-04-08 LAB — LACTIC ACID, PLASMA
Lactic Acid, Venous: 2 mmol/L (ref 0.5–1.9)
Lactic Acid, Venous: 1.4 mmol/L (ref 0.5–1.9)

## 2020-04-08 LAB — COMPREHENSIVE METABOLIC PANEL
ALT: 13 U/L (ref 0–44)
AST: 23 U/L (ref 15–41)
Albumin: 3.9 g/dL (ref 3.5–5.0)
Alkaline Phosphatase: 69 U/L (ref 38–126)
Anion gap: 10 (ref 5–15)
BUN: 5 mg/dL — ABNORMAL LOW (ref 8–23)
CO2: 32 mmol/L (ref 22–32)
Calcium: 9.4 mg/dL (ref 8.9–10.3)
Chloride: 99 mmol/L (ref 98–111)
Creatinine, Ser: 0.5 mg/dL (ref 0.44–1.00)
GFR calc Af Amer: >60 mL/min (ref >60)
GFR calc non Af Amer: >60 mL/min (ref >60)
Glucose, Bld: 154 mg/dL — ABNORMAL HIGH (ref 70–99)
Potassium: 4.1 mmol/L (ref 3.5–5.1)
Sodium: 141 mmol/L (ref 135–145)
Total Bilirubin: 0.5 mg/dL (ref 0.3–1.2)
Total Protein: 8.5 g/dL — ABNORMAL HIGH (ref 6.5–8.1)

## 2020-04-08 LAB — CBC WITH DIFFERENTIAL/PLATELET
Abs Immature Granulocytes: 0.04 10*3/uL (ref 0.00–0.07)
Basophils Absolute: 0.2 10*3/uL — ABNORMAL HIGH (ref 0.0–0.1)
Basophils Relative: 1 %
Eosinophils Absolute: 0.2 10*3/uL (ref 0.0–0.5)
Eosinophils Relative: 2 %
HCT: 48.4 % — ABNORMAL HIGH (ref 36.0–46.0)
Hemoglobin: 15 g/dL (ref 12.0–15.0)
Immature Granulocytes: 0 %
Lymphocytes Relative: 21 %
Lymphs Abs: 3 10*3/uL (ref 0.7–4.0)
MCH: 29.5 pg (ref 26.0–34.0)
MCHC: 31 g/dL (ref 30.0–36.0)
MCV: 95.1 fL (ref 80.0–100.0)
Monocytes Absolute: 1.3 10*3/uL — ABNORMAL HIGH (ref 0.1–1.0)
Monocytes Relative: 9 %
Neutro Abs: 9.5 10*3/uL — ABNORMAL HIGH (ref 1.7–7.7)
Neutrophils Relative %: 67 %
Platelets: 407 10*3/uL — ABNORMAL HIGH (ref 150–400)
RBC: 5.09 MIL/uL (ref 3.87–5.11)
RDW: 15.1 % (ref 11.5–15.5)
WBC: 14.2 10*3/uL — ABNORMAL HIGH (ref 4.0–10.5)
nRBC: 0 % (ref 0.0–0.2)

## 2020-04-08 LAB — I-STAT ARTERIAL BLOOD GAS, ED
Acid-Base Excess: 2 mmol/L (ref 0.0–2.0)
Bicarbonate: 25.2 mmol/L (ref 20.0–28.0)
Calcium, Ion: 1.2 mmol/L (ref 1.15–1.40)
HCT: 40 % (ref 36.0–46.0)
Hemoglobin: 13.6 g/dL (ref 12.0–15.0)
O2 Saturation: 99 %
Patient temperature: 98.3
Potassium: 3.2 mmol/L — ABNORMAL LOW (ref 3.5–5.1)
Sodium: 139 mmol/L (ref 135–145)
TCO2: 26 mmol/L (ref 22–32)
pCO2 arterial: 34.6 mmHg (ref 32.0–48.0)
pH, Arterial: 7.468 — ABNORMAL HIGH (ref 7.350–7.450)
pO2, Arterial: 123 mmHg — ABNORMAL HIGH (ref 83.0–108.0)

## 2020-04-08 LAB — BRAIN NATRIURETIC PEPTIDE: B Natriuretic Peptide: 305.1 pg/mL — ABNORMAL HIGH (ref 0.0–100.0)

## 2020-04-08 LAB — D-DIMER, QUANTITATIVE: D-Dimer, Quant: 1.32 ug/mL-FEU — ABNORMAL HIGH (ref 0.00–0.50)

## 2020-04-08 LAB — TROPONIN I (HIGH SENSITIVITY)
Troponin I (High Sensitivity): 51 ng/L — ABNORMAL HIGH (ref <18)
Troponin I (High Sensitivity): 99 ng/L — ABNORMAL HIGH (ref <18)
Troponin I (High Sensitivity): 104 ng/L (ref <18)

## 2020-04-08 LAB — HIV ANTIBODY (ROUTINE TESTING W REFLEX): HIV Screen 4th Generation wRfx: NONREACTIVE

## 2020-04-08 LAB — SARS CORONAVIRUS 2 BY RT PCR (HOSPITAL ORDER, PERFORMED IN ~~LOC~~ HOSPITAL LAB): SARS Coronavirus 2: NEGATIVE

## 2020-04-08 LAB — PROCALCITONIN: Procalcitonin: <0.1 ng/mL

## 2020-04-08 MED ORDER — ASPIRIN EC 81 MG PO TBEC
81.0000 mg | DELAYED_RELEASE_TABLET | Freq: Every day | ORAL | Status: DC
  Administered 2020-04-09 – 2020-04-10 (×2): 81 mg via ORAL
  Filled 2020-04-08 (×2): qty 1

## 2020-04-08 MED ORDER — IPRATROPIUM-ALBUTEROL 0.5-2.5 (3) MG/3ML IN SOLN
3.0000 mL | Freq: Four times a day (QID) | RESPIRATORY_TRACT | Status: DC
  Administered 2020-04-08: 3 mL via RESPIRATORY_TRACT

## 2020-04-08 MED ORDER — IOHEXOL 350 MG/ML SOLN
100.0000 mL | Freq: Once | INTRAVENOUS | Status: AC | PRN
  Administered 2020-04-08: 100 mL via INTRAVENOUS

## 2020-04-08 MED ORDER — VANCOMYCIN HCL 500 MG/100ML IV SOLN: 500.0000 mg | INTRAVENOUS | Status: DC

## 2020-04-08 MED ORDER — PANTOPRAZOLE SODIUM 40 MG PO TBEC
40.0000 mg | DELAYED_RELEASE_TABLET | Freq: Every day | ORAL | Status: DC
  Administered 2020-04-09 – 2020-04-10 (×2): 40 mg via ORAL
  Filled 2020-04-08 (×2): qty 1

## 2020-04-08 MED ORDER — VANCOMYCIN HCL 750 MG/150ML IV SOLN
750.0000 mg | Freq: Once | INTRAVENOUS | Status: AC
  Administered 2020-04-08: 750 mg via INTRAVENOUS
  Filled 2020-04-08: qty 150

## 2020-04-08 MED ORDER — NICOTINE 14 MG/24HR TD PT24
14.0000 mg | MEDICATED_PATCH | Freq: Every day | TRANSDERMAL | Status: DC
  Administered 2020-04-08 – 2020-04-10 (×3): 14 mg via TRANSDERMAL
  Filled 2020-04-08 (×3): qty 1

## 2020-04-08 MED ORDER — MOMETASONE FURO-FORMOTEROL FUM 200-5 MCG/ACT IN AERO
2.0000 | INHALATION_SPRAY | Freq: Two times a day (BID) | RESPIRATORY_TRACT | Status: DC
  Administered 2020-04-10: 2 via RESPIRATORY_TRACT
  Filled 2020-04-08: qty 8.8

## 2020-04-08 MED ORDER — METHYLPREDNISOLONE SODIUM SUCC 125 MG IJ SOLR
60.0000 mg | Freq: Three times a day (TID) | INTRAMUSCULAR | Status: DC
  Administered 2020-04-08 – 2020-04-09 (×3): 60 mg via INTRAVENOUS
  Filled 2020-04-08 (×3): qty 2

## 2020-04-08 MED ORDER — SODIUM CHLORIDE 0.9 % IV SOLN
2.0000 g | Freq: Three times a day (TID) | INTRAVENOUS | Status: DC
  Administered 2020-04-08: 2 g via INTRAVENOUS
  Filled 2020-04-08: qty 2

## 2020-04-08 MED ORDER — CLOPIDOGREL BISULFATE 75 MG PO TABS
75.0000 mg | ORAL_TABLET | Freq: Every day | ORAL | Status: DC
  Administered 2020-04-09 – 2020-04-10 (×2): 75 mg via ORAL
  Filled 2020-04-08 (×2): qty 1

## 2020-04-08 MED ORDER — NITROGLYCERIN IN D5W 200-5 MCG/ML-% IV SOLN
0.0000 ug/min | INTRAVENOUS | Status: AC
  Administered 2020-04-08: 50 ug/min via INTRAVENOUS
  Administered 2020-04-09: 55 ug/min via INTRAVENOUS
  Filled 2020-04-08 (×2): qty 250

## 2020-04-08 MED ORDER — ONDANSETRON HCL 4 MG/2ML IJ SOLN
4.0000 mg | Freq: Four times a day (QID) | INTRAMUSCULAR | Status: DC | PRN
  Administered 2020-04-08 – 2020-04-09 (×4): 4 mg via INTRAVENOUS
  Filled 2020-04-08 (×4): qty 2

## 2020-04-08 MED ORDER — SODIUM CHLORIDE 0.9 % IV SOLN
8.0000 mg | Freq: Three times a day (TID) | INTRAVENOUS | Status: DC | PRN
  Filled 2020-04-08: qty 4

## 2020-04-08 MED ORDER — ENOXAPARIN SODIUM 30 MG/0.3ML ~~LOC~~ SOLN
30.0000 mg | SUBCUTANEOUS | Status: DC
  Administered 2020-04-09: 30 mg via SUBCUTANEOUS
  Filled 2020-04-08 (×2): qty 0.3

## 2020-04-08 MED ORDER — AZITHROMYCIN 250 MG PO TABS
500.0000 mg | ORAL_TABLET | Freq: Every day | ORAL | Status: AC
  Administered 2020-04-08: 500 mg via ORAL
  Filled 2020-04-08: qty 2

## 2020-04-08 MED ORDER — LORAZEPAM 2 MG/ML IJ SOLN
0.5000 mg | Freq: Once | INTRAMUSCULAR | Status: AC | PRN
  Administered 2020-04-08: 0.5 mg via INTRAVENOUS
  Filled 2020-04-08: qty 1

## 2020-04-08 MED ORDER — AZITHROMYCIN 250 MG PO TABS
250.0000 mg | ORAL_TABLET | ORAL | Status: DC
  Administered 2020-04-09: 250 mg via ORAL
  Filled 2020-04-08: qty 1

## 2020-04-08 MED ORDER — OXYCODONE HCL 5 MG PO TABS
5.0000 mg | ORAL_TABLET | Freq: Four times a day (QID) | ORAL | Status: DC | PRN
  Administered 2020-04-09 – 2020-04-10 (×3): 5 mg via ORAL
  Filled 2020-04-08 (×4): qty 1

## 2020-04-08 MED ORDER — ONDANSETRON HCL 4 MG/2ML IJ SOLN
4.0000 mg | Freq: Once | INTRAMUSCULAR | Status: AC
  Administered 2020-04-08: 4 mg via INTRAVENOUS
  Filled 2020-04-08: qty 2

## 2020-04-08 MED ORDER — ROSUVASTATIN CALCIUM 20 MG PO TABS
20.0000 mg | ORAL_TABLET | Freq: Every day | ORAL | Status: DC
  Administered 2020-04-09: 20 mg via ORAL
  Filled 2020-04-08 (×2): qty 1

## 2020-04-08 MED ORDER — GABAPENTIN 600 MG PO TABS
600.0000 mg | ORAL_TABLET | Freq: Three times a day (TID) | ORAL | Status: DC
  Administered 2020-04-09 – 2020-04-10 (×4): 600 mg via ORAL
  Filled 2020-04-08 (×4): qty 1

## 2020-04-08 MED ORDER — IPRATROPIUM-ALBUTEROL 0.5-2.5 (3) MG/3ML IN SOLN
3.0000 mL | Freq: Four times a day (QID) | RESPIRATORY_TRACT | Status: DC
  Administered 2020-04-09 (×2): 3 mL via RESPIRATORY_TRACT
  Filled 2020-04-08 (×2): qty 3

## 2020-04-08 MED ORDER — MIRTAZAPINE 15 MG PO TABS
7.5000 mg | ORAL_TABLET | Freq: Every day | ORAL | Status: DC
  Administered 2020-04-09: 7.5 mg via ORAL
  Filled 2020-04-08 (×3): qty 1

## 2020-04-08 NOTE — ED Notes (Signed)
Upon pt's arrival back to her room via transport, transport relayed to this RN that pt refused her CT scan bc the machine made her feel claustrophobic & "harder to breath". This RN told pt that she should have forewarned [place] so she would have been given the appropriate meds for anxiety, she is aware that the test may be attempted again & that a med before the scan this time will be given.

## 2020-04-08 NOTE — Progress Notes (Signed)
Pharmacy Antibiotic Note  Pam Johnson is a 69 y.o. female admitted on 04/08/2020 with pneumonia.  Pharmacy has been consulted for cefepime and vancomycin dosing.  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 Green sputum noted. 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 WBC count elevated at 14.2. CXR shows no acute disease. Creatinine 0.5, weight per patient's family is 42.7 kg, eCrCl ~87. Noted penicillin allergy, per patient and family it happened when she was a child and she felt sick on it.  Plan: Vancomycin 750 IV every x1 hour for loading dose.  Goal trough 15-20 mcg/mL.  Vancomycin 500 mg IV 24 hours Cefepime 2 grams IV every 8 hours  Monitor renal function, clinical status, LOT, de-escalation  F/u MRSA PCR nasal swab   No data recorded.  Recent Labs  Lab 04/08/20 1328  WBC 14.2*  CREATININE 0.50  LATICACIDVEN 2.0*    CrCl cannot be calculated (Unknown ideal weight.).    Allergies  Allergen Reactions  . Acyclovir And Related Other (See Comments)    unknown  . Ciprofloxacin Other (See Comments)    Altered mental status.  . Ciprofloxacin Other (See Comments)    Confused and nausea   . Penicillins Other (See Comments)    Unknown reaction Has patient had a PCN reaction causing immediate rash, facial/tongue/throat swelling, SOB or lightheadedness with hypotension: NO Has patient had a PCN reaction causing severe rash involving mucus membranes or skin necrosis: NO Has patient had a PCN reaction that required hospitalization: NO Has patient had a PCN reaction occurring within the last 10 years: NO If all of the above answers are "NO", then may proceed with Cephalosporin use.   . Promethazine Other (See Comments)    Was advised by her PCP not to take this medication.  . Morphine Nausea And Vomiting    Antimicrobials this admission: vanco 6/15 >>  cefepime 6/15 >>   Dose adjustments this admission: N/A  Microbiology  results: 6/15 MRSA PCR: ordered   Thank you for allowing pharmacy to be a part of this patient's care.  Fara Olden, PharmD PGY-1 Pharmacy Resident   Please check amion for clinical pharmacist contact number 04/08/2020 4:05 PM

## 2020-04-08 NOTE — ED Provider Notes (Signed)
[facility] EMERGENCY DEPARTMENT Provider Note   CSN: [id number] Arrival date & time: 04/08/20  1309     History Chief Complaint  Patient presents with  . Shortness of Breath    Pam Johnson is a 69 y.o. female.  The history is provided by the patient and medical records. No language interpreter was used.  Shortness of Breath Severity:  Severe Onset quality:  Gradual Duration:  1 week Timing:  Constant Progression:  Waxing and waning Chronicity:  New Relieved by:  Nothing Worsened by:  Nothing Ineffective treatments:  Oxygen Associated symptoms: cough, diaphoresis, sputum production and wheezing   Associated symptoms: no abdominal pain, no chest pain, no fever, no headaches, no neck pain, no syncope and no vomiting        Past Medical History:  Diagnosis Date  . Acid reflux   . COPD (chronic obstructive pulmonary disease) (HCC)   . Hypertension   . Sacral fracture (HCC)   . Spinal stenosis     Patient Active Problem List   Diagnosis Date Noted  . COPD exacerbation (HCC) 07/18/2018  . 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 Closed fracture of multiple ribs of left side 05/24/2018  . Chronic pain disorder 04/10/2018  . Malnutrition of moderate degree 04/07/2018  . Protein-calorie malnutrition, severe (HCC) 04/06/2018  . Altered mental status   . Left-sided weakness 04/05/2018  . Hyponatremia 04/05/2018  . Dysphagia, post-stroke   . Acute blood loss anemia   . Chronic obstructive pulmonary disease (HCC)   . Acute ischemic right MCA stroke (HCC) 03/24/2018  . 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(HCC)   . Essential hypertension   . 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 Pneumonia 10/18/2015  . Coccydynia 11/01/2013  . Depression 05/02/2013  . Solitary pulmonary nodule 12/04/2012  . Obstructive chronic bronchitis (HCC) 11/02/2012  . Tobacco use disorder 11/02/2012  . 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 Barrett esophagus 12/01/2010  . Long term current use of opiate analgesic 01/23/2004    Past Surgical History:  Procedure Laterality Date  . ABDOMINAL HYSTERECTOMY    . CHOLECYSTECTOMY    . HERNIA REPAIR    . IR ANGIO INTRA EXTRACRAN SEL COM CAROTID INNOMINATE UNI L MOD SED  03/22/2018  . IR ANGIO VERTEBRAL SEL SUBCLAVIAN INNOMINATE UNI R MOD SED  03/22/2018  . IR CT HEAD LTD  03/22/2018  . IR PERCUTANEOUS ART THROMBECTOMY/INFUSION INTRACRANIAL INC DIAG ANGIO  03/22/2018  . LEFT HEART CATH AND CORONARY ANGIOGRAPHY  2000   Non-obstructive CAD  . NISSEN FUNDOPLICATION    . RADIOLOGY WITH ANESTHESIA N/A 03/22/2018   Procedure: IR WITH ANESTHESIA;  Surgeon: Julieanne Cotton, MD;  Location: [facility] OR;  Service: Radiology;  Laterality: N/A;  . TOTAL HIP ARTHROPLASTY Right 11/2017     OB History   No obstetric history on file.     Family History  Problem Relation Age of Onset  . Hypertension Mother   . Hypertension Father     Social History   Tobacco Use  . Smoking status: Current Every Day Smoker    Packs/day: 1.00    Types: Cigarettes  . Smokeless tobacco: Never Used  Vaping Use  . Vaping Use: Never assessed  Substance Use Topics  . Alcohol use: Yes    Comment: occ  . Drug use: No    Home Medications Prior to Admission medications   Medication Sig Start Date End Date Taking? Authorizing Provider  acetaminophen (TYLENOL)  325 MG tablet Take 1-2 tablets (325-650 mg total) by mouth every 6 (six) hours as needed for mild pain. 03/31/18   Love, Evlyn Kanner, PA-C  albuterol (PROVENTIL HFA;VENTOLIN HFA) 108 (90 BASE) MCG/ACT inhaler Inhale 2 puffs into the lungs every 6 (six) hours as needed for wheezing or shortness of breath.    [provider]  amLODipine (NORVASC) 10 MG tablet Take 1 tablet (10 mg total) by mouth daily. 07/19/18   Yvette Rack, MD  aspirin 81 MG EC tablet Take 1 tablet (81 mg total) by mouth daily. 04/01/18   Love, Evlyn Kanner, PA-C    CHANTIX STARTING MONTH PAK 0.5 MG X 11 & 1 MG X 42 tablet Take 0.5 mg by mouth See admin instructions. Take 0.5 mg  Once daily  For 3 days , then increase to one 0.5 mg  Tab twice a day 07/13/18   [provider]  citalopram (CELEXA) 10 MG tablet Take 10 mg by mouth See admin instructions. Take 10 mg for 1 week then increase to 20 mg daily 07/13/18   [provider]  clopidogrel (PLAVIX) 75 MG tablet Take 1 tablet (75 mg total) by mouth daily. 04/01/18   Love, Evlyn Kanner, PA-C  docusate sodium (COLACE) 100 MG capsule Take 1 capsule (100 mg total) by mouth 2 (two) times daily. 07/19/18   Yvette Rack, MD  fluticasone (FLONASE) 50 MCG/ACT nasal spray Place 2 sprays into both nostrils daily as needed for allergies or rhinitis.    [provider]  Fluticasone-Salmeterol (ADVAIR) 500-50 MCG/DOSE AEPB Inhale 1 puff into the lungs 2 (two) times daily.    [provider]  gabapentin (NEURONTIN) 600 MG tablet Take 1,200 mg by mouth 3 (three) times daily. 07/05/18   [provider]  HYDROcodone-acetaminophen (NORCO) 7.5-325 MG tablet Take 1 tablet by mouth 2 (two) times daily as needed (breakthrough pain). 03/31/18   Love, Evlyn Kanner, PA-C  ipratropium-albuterol (DUONEB) 0.5-2.5 (3) MG/3ML SOLN Inhale 3 mLs into the lungs every 6 (six) hours. 06/29/18   [provider]  LORazepam (ATIVAN) 0.5 MG tablet Take 0.5 mg by mouth every 8 (eight) hours as needed. 07/03/18   [provider]  meloxicam (MOBIC) 7.5 MG tablet Take 7.5 mg by mouth daily at 12 noon. 07/13/18   [provider]  Menthol-Methyl Salicylate (MUSCLE RUB) 10-15 % CREA Apply 1 application topically 4 (four) times daily. To knees Patient taking differently: Apply 1 application topically as needed. To knees 03/31/18   Love, Evlyn Kanner, PA-C  mirtazapine (REMERON) 7.5 MG tablet Take 1 tablet (7.5 mg total) by mouth at bedtime. Patient taking differently: Take 30 mg by mouth at bedtime.  04/26/18    Love, Evlyn Kanner, PA-C  montelukast (SINGULAIR) 10 MG tablet Take 10 mg by mouth daily.     [provider]  nitroGLYCERIN (NITROSTAT) 0.4 MG SL tablet Place 0.4 mg under the tongue every 5 (five) minutes as needed for chest pain.    [provider]  olopatadine (PATANOL) 0.1 % ophthalmic solution Place 1 drop into both eyes 2 (two) times daily.    [provider]  ondansetron (ZOFRAN) 8 MG tablet Take 8 mg by mouth as needed. 07/14/18   [provider]  oxyCODONE (OXYCONTIN) 10 mg 12 hr tablet Take 1 tablet (10 mg total) by mouth every 12 (twelve) hours. Patient taking differently: Take 20 mg by mouth 3 (three) times daily.  03/31/18   Jacquelynn Cree, PA-C  tiotropium (SPIRIVA) 18 MCG inhalation capsule Place 18 mcg into inhaler and inhale daily.    [provider]  triamcinolone cream (KENALOG) 0.5 % Apply 1 application topically daily as needed (rash).    [provider]    Allergies    Acyclovir and related, Ciprofloxacin, Ciprofloxacin, Penicillins, Promethazine, and Morphine  Review of Systems   Review of Systems  Constitutional: Positive for diaphoresis. Negative for chills, fatigue and fever.  HENT: Negative for congestion.   Eyes: Negative for visual disturbance.  Respiratory: Positive for cough, sputum production, chest tightness, shortness of breath and wheezing. Negative for choking and stridor.   Cardiovascular: Negative for chest pain, palpitations, leg swelling and syncope.  Gastrointestinal: Negative for abdominal pain, constipation, diarrhea, nausea and vomiting.  Genitourinary: Negative for dysuria.  Musculoskeletal: Negative for back pain, neck pain and neck stiffness.  Skin: Negative for wound.  Neurological: Negative for light-headedness and headaches.  Psychiatric/Behavioral: Negative for agitation.  All other systems reviewed and are negative.   Physical Exam Updated Vital Signs BP 131/78   Pulse 92   Resp 13    SpO2 96%   Physical Exam Vitals and nursing note reviewed.  Constitutional:      General: She is in acute distress.     Appearance: She is well-developed. She is ill-appearing. She is not toxic-appearing or diaphoretic.  HENT:     Head: Normocephalic and atraumatic.     Mouth/Throat:     Pharynx: Oropharynx is clear.  Eyes:     Conjunctiva/sclera: Conjunctivae normal.     Pupils: Pupils are equal, round, and reactive to light.  Cardiovascular:     Rate and Rhythm: Normal rate and regular rhythm.     Heart sounds: No murmur heard.   Pulmonary:     Effort: Tachypnea and respiratory distress present.     Breath sounds: No stridor. Decreased breath sounds, wheezing and rales present. No rhonchi.  Chest:     Chest wall: No tenderness.  Abdominal:     Palpations: Abdomen is soft.     Tenderness: There is no abdominal tenderness.  Musculoskeletal:     Cervical back: Neck supple.     Right lower leg: No tenderness. No edema.     Left lower leg: No tenderness. No edema.  Skin:    General: Skin is warm and dry.     Capillary Refill: Capillary refill takes less than 2 seconds.     Findings: No erythema.  Neurological:     Mental Status: She is alert. She is disoriented.     ED Results / Procedures / Treatments   Labs (all labs ordered are listed, but only abnormal results are displayed) Labs Reviewed  CBC WITH DIFFERENTIAL/PLATELET - Abnormal; Notable for the following components:      Result Value   WBC 14.2 (*)    HCT 48.4 (*)    Platelets 407 (*)    Neutro Abs 9.5 (*)    Monocytes Absolute 1.3 (*)    Basophils Absolute 0.2 (*)    All other components within normal limits  COMPREHENSIVE METABOLIC PANEL - Abnormal; Notable for the following components:   Glucose, Bld 154 (*)    BUN 5 (*)    Total Protein 8.5 (*)    All other components within normal limits  BRAIN NATRIURETIC PEPTIDE - Abnormal; Notable for the following components:   B Natriuretic Peptide 305.1 (*)     All other components within normal limits  D-DIMER, QUANTITATIVE (NOT AT  [facility]) - Abnormal; Notable for the following components:   D-Dimer, Quant 1.32 (*)    All other components within normal limits  LACTIC ACID, PLASMA - Abnormal; Notable for the following components:   Lactic Acid, Venous 2.0 (*)    All other components within normal limits  TROPONIN I (HIGH SENSITIVITY) - Abnormal; Notable for the following components:   Troponin I (High Sensitivity) 51 (*)    All other components within normal limits  SARS CORONAVIRUS 2 BY RT PCR (HOSPITAL ORDER, [facility])  LACTIC ACID, PLASMA  TROPONIN I (HIGH SENSITIVITY)    EKG None  Radiology DG Chest Portable 1 View  Result Date: 04/08/2020 CLINICAL DATA:  Cough and shortness of breath for 10 days. Hypoxia today. EXAM: PORTABLE CHEST 1 VIEW COMPARISON:  Single-view of the chest 07/18/2018. FINDINGS: Pulmonary hyperexpansion and attenuation of the pulmonary vasculature unchanged in appearance. No consolidative process, pneumothorax or pleural effusion. Atherosclerosis is noted. Heart size is normal. Hiatal hernia is again seen. Also again seen is a right eighth rib fracture. IMPRESSION: No acute disease. Hiatal hernia. Aortic Atherosclerosis (ICD10-I70.0) and Emphysema (ICD10-J43.9). Electronically Signed   By: Inge Rise M.D.   On: 04/08/2020 13:44    Procedures Procedures (including critical care time)  CRITICAL CARE Performed by: Gwenyth Allegra Jodelle Fausto Total critical care time: 45 minutes Critical care time was exclusive of separately billable procedures and treating other patients. Critical care was necessary to treat or prevent imminent or life-threatening deterioration. Critical care was time spent personally by me on the following activities: development of treatment plan with patient and/or surrogate as well as nursing, discussions with consultants, evaluation of patient's response to treatment,  examination of patient, obtaining history from patient or surrogate, ordering and performing treatments and interventions, ordering and review of laboratory studies, ordering and review of radiographic studies, pulse oximetry and re-evaluation of patient's condition.   Medications Ordered in ED Medications  nitroGLYCERIN 50 mg in dextrose 5 % 250 mL (0.2 mg/mL) infusion (50 mcg/min Intravenous New Bag/Given 04/08/20 1328)    ED Course  I have reviewed the triage vital signs and the nursing notes.  Pertinent labs & imaging results that were available during my care of the patient were reviewed by me and considered in my medical decision making (see chart for details).    MDM Rules/Calculators/A&P                          NOLYN EILERT is a 69 y.o. female with a past medical history significant for prior stroke, hypertension, COPD, GERD, and prior anemia who presents with respiratory distress.  According to patient, she has been having shortness of breath the last week including some productive cough.  She denies any fevers or chills.  She denies any chest pain.  She denies any edema in her legs or leg pains.  EMS found her to be in respiratory distress with tachypnea.  Patient reportedly had oxygen saturations that dipped into the 80s but her blood pressure has been in the 230s.  Patient arrived on a nonrebreather.  Patient received Solu-Medrol, magnesium, and 2 doses of epinephrine as well as several duo nebs.  Patient is able to answer some short sentence answers.  She still denies any chest pain.  On exam, patient has some wheezing in all lung fields as well as diminished sounds and some faint crackles in the bases.  Chest nontender and abdomen nontender.  Pulses present in extremities.  Legs nontender nonedematous.  Clinically I am concerned about COPD exacerbation versus flash pulmonary female with hypertensive emergency versus pneumonia or other cause of the respiratory distress.  We  started a nitro drip which helped her blood pressure and breathing is improving.  Given several doses of epinephrine, the increased work of breathing, and the nitro drip, will speak with critical care about admission given her appearance on arrival and the nitro drip.  Anticipate admission after work-up is completed.  D-dimer was elevated, PE study was ordered.  BNP elevated but improved from prior.  Leukocytosis present with normal hemoglobin.  Kidney function is normal.  Troponin elevated but will trend.  Chest x-ray shows no acute disease.  Patient will have the CT PE study.  Due to the nitro drip and epi, critical care will come see the patient but I anticipate she will likely be stable for stepdown if they feel this is appropriate.  Care transferred to Dr. Judd Lienelo while awaiting results of PE study and reevaluation.  She will be admitted after.   Final Clinical Impression(s) / ED Diagnoses Final diagnoses:  Acute respiratory distress  Shortness of breath  Hypoxia  Wheezing  Hypertensive emergency    Clinical Impression: 1. Acute respiratory distress   2. Shortness of breath   3. Hypoxia   4. Wheezing   5. Hypertensive emergency     Disposition: Care transferred to Dr. Judd Lienelo while awaiting results of PE study and reevaluation.  She will be admitted after.  This note was prepared with assistance of Conservation officer, historic buildingsDragon voice recognition software. Occasional wrong-word or sound-a-like substitutions may have occurred due to the inherent limitations of voice recognition software.      Chiyeko Ferre, Canary Brimhristopher J, MD 04/08/20 [phone]

## 2020-04-08 NOTE — ED Triage Notes (Signed)
Pt arrived to ED via [facility] with c/o SOB that pt states has been going on for the past week. Pt arrived to ED on NRB mask & sating at 100%, pt verbal- able to make needs known, BP is 233/109, afebrile, pulse in the 80's, A&Ox4, 24g PIV in Rt FA, weeping wound to the inside of her Lt shin area. Pt does have a Hx of intubation, as well as COPD, emphysema, stroke & her lung sounds upon arrival sound very diminished in all areas (per EMS).

## 2020-04-08 NOTE — Consult Note (Signed)
NAME:  Pam Johnson, MRN:  [id number], DOB:  09-09-1951, LOS: 0 ADMISSION DATE:  04/08/2020, CONSULTATION DATE:  6/15 REFERRING MD: Dr. Stark Jock, CHIEF COMPLAINT: Shortness of breath  Brief History   69 year old female with end-stage COPD presented with 1 week of progressive dyspnea.  Admitted with presumed COPD exacerbation.  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  She presented to [facility] emergency department from home on 6/15 with complaints of progressive dyspnea x1 week.  Dyspnea associated with cough productive of green sputum.  Upon EMS arrival she reportedly had very significant wheeze concerning for anaphylaxis and was treated with intramuscular epinephrine x2.  Upon arrival to the emergency department she was felt to be having a COPD exacerbation and was treated with Solu-Medrol, magnesium, and nebulized bronchodilators.  She was also profoundly hypertensive with concern for crackles on exam and was started on nitroglycerin infusion.  The aforementioned treatments improve the patient's condition quite considerably.  She was able to be weaned from a nonrebreather to 2 L nasal cannula with oxygen saturations in the high 90s.  PCCM was consulted for further evaluation of dyspnea in the setting of severe COPD.  Past Medical History   has a past medical history of Acid reflux, COPD (chronic obstructive pulmonary disease) ([place]), Hypertension, Sacral fracture ([place]), and Spinal stenosis.   Significant Hospital Events   6/15 admit  Consults:  Pulmonary  Procedures:    Significant Diagnostic Tests:    Micro Data:    Antimicrobials:  Cefepime 6/15 > Vancomycin 6/15 >  Interim history/subjective:    Objective   Blood pressure 140/82, pulse 93, resp. rate 17, SpO2 97 %.       No intake or output data in  the 24 hours ending 04/08/20 1543 There were no vitals filed for this visit.  Examination: General: Cachectic elderly appearing female in mild respiratory distress HENT: Normocephalic, atraumatic, PERRL, no JVD Lungs: Poor air movement throughout, faint expiratory wheeze Cardiovascular: Borderline tachycardia, regular Abdomen: Soft, nontender, nondistended Extremities: Left upper extremity contraction, no significant edema Neuro: Alert, oriented, nonfocal  Resolved Hospital Problem list     Assessment & Plan:   Acute exacerbation of COPD: She has severe COPD at baseline on 5 L nasal cannula.  She continues to smoke about 1 cigarette/day.  Last cigarette 6/14. Productive cough r/o CAP, she has been on antibiotics in the past month.  -Admit to progressive care unit -Scheduled DuoNeb, as needed albuterol -Scheduled Solu-Medrol -Cefepime/vancomycin considering she was on antibiotics about a month ago.  Low threshold to de-escalate as indicated. -Check ABG, if acidemic would benefit from BiPAP. -Smoking cessation counseling  Pulmonary cachexia -Recommend nutrition consult -Palliative care consultation would be reasonable  Goals of care: -I did discuss goals of care with [name] and her husband. They are clear that after having been on the vent in the past, she would not want to be placed on the vent again. This is certainly a reasonable request considering the severity of her COPD and residual stroke effects. Given she would not want to be on the vent, they also agree she would not want to undergo CPR God forbid she suffered cardiac arrest. DNR. Conversation witnessed by Richardson Landry RT.    Best practice:  Diet: NPO Pain/Anxiety/Delirium protocol (if indicated): NA VAP protocol (if indicated): NA DVT prophylaxis: per primary  GI prophylaxis: per primary Glucose control: NA Mobility: br Code Status:  FULL Family Communication: Patient/husband updated in ED Disposition: PCU  Labs     CBC: Recent Labs  Lab 04/08/20 1328  WBC 14.2*  NEUTROABS 9.5*  HGB 15.0  HCT 48.4*  MCV 95.1  PLT 407*    Basic Metabolic Panel: Recent Labs  Lab 04/08/20 1328  NA 141  K 4.1  CL 99  CO2 32  GLUCOSE 154*  BUN 5*  CREATININE 0.50  CALCIUM 9.4   GFR: CrCl cannot be calculated (Unknown ideal weight.). Recent Labs  Lab 04/08/20 1328  WBC 14.2*  LATICACIDVEN 2.0*    Liver Function Tests: Recent Labs  Lab 04/08/20 1328  AST 23  ALT 13  ALKPHOS 69  BILITOT 0.5  PROT 8.5*  ALBUMIN 3.9   No results for input(s): LIPASE, AMYLASE in the last 168 hours. No results for input(s): AMMONIA in the last 168 hours.  ABG    Component Value Date/Time   PHART 7.386 07/18/2018 0720   PCO2ART 41.7 07/18/2018 0720   PO2ART 60.0 (L) 07/18/2018 0720   HCO3 25.0 07/18/2018 0720   TCO2 26 07/18/2018 0720   ACIDBASEDEF 1.0 07/18/2018 0440   O2SAT 90.0 07/18/2018 0720     Coagulation Profile: No results for input(s): INR, PROTIME in the last 168 hours.  Cardiac Enzymes: No results for input(s): CKTOTAL, CKMB, CKMBINDEX, TROPONINI in the last 168 hours.  HbA1C: Hgb A1c MFr Bld  Date/Time Value Ref Range Status  07/18/2018 03:26 PM 5.9 (H) 4.8 - 5.6 % Final    Comment:    (NOTE) Pre diabetes:          5.7%-6.4% Diabetes:              >6.4% Glycemic control for   <7.0% adults with diabetes   03/23/2018 04:25 AM 5.6 4.8 - 5.6 % Final    Comment:    (NOTE) Pre diabetes:          5.7%-6.4% Diabetes:              >6.4% Glycemic control for   <7.0% adults with diabetes     CBG: No results for input(s): GLUCAP in the last 168 hours.  Review of Systems:   Bolds are positive  Constitutional: weight loss, gain, night sweats, Fevers, chills, fatigue .  HEENT: headaches, Sore throat, sneezing, nasal congestion, post nasal drip, Difficulty swallowing, Tooth/dental problems, visual complaints visual changes, ear ache CV:  chest pain, radiates:,Orthopnea, PND,  swelling in lower extremities, dizziness, palpitations, syncope.  GI  heartburn, indigestion, abdominal pain, nausea, vomiting, diarrhea, change in bowel habits, loss of appetite, bloody stools.  Resp: cough, productive: green sputum, hemoptysis, dyspnea, chest pain, pleuritic.  Skin: rash or itching or icterus GU: dysuria, change in color of urine, urgency or frequency. flank pain, hematuria  MS: joint pain or swelling. decreased range of motion  Psych: change in mood or affect. depression or anxiety.  Neuro: difficulty with speech, weakness, numbness, ataxia    Past Medical History  She,  has a past medical history of Acid reflux, COPD (chronic obstructive pulmonary disease) ([facility]), Hypertension, Sacral fracture ([facility]), and Spinal stenosis.   Surgical History    Past Surgical History:  Procedure Laterality Date   ABDOMINAL HYSTERECTOMY     CHOLECYSTECTOMY     HERNIA REPAIR     IR ANGIO INTRA EXTRACRAN SEL COM CAROTID INNOMINATE UNI L MOD SED  03/22/2018   IR ANGIO VERTEBRAL SEL  SUBCLAVIAN INNOMINATE UNI R MOD SED  03/22/2018   IR CT HEAD LTD  03/22/2018   IR PERCUTANEOUS ART THROMBECTOMY/INFUSION INTRACRANIAL INC DIAG ANGIO  03/22/2018   LEFT HEART CATH AND CORONARY ANGIOGRAPHY  2000   Non-obstructive CAD   NISSEN FUNDOPLICATION     RADIOLOGY WITH ANESTHESIA N/A 03/22/2018   Procedure: IR WITH ANESTHESIA;  Surgeon: Julieanne Cotton, MD;  Location: [facility] OR;  Service: Radiology;  Laterality: N/A;   TOTAL HIP ARTHROPLASTY Right 11/2017     Social History   reports that she has been smoking cigarettes. She has been smoking about 1.00 pack per day. She has never used smokeless tobacco. She reports current alcohol use. She reports that she does not use drugs.   Family History   Her family history includes Hypertension in her father and mother.   Allergies Allergies  Allergen Reactions   Acyclovir And Related Other (See Comments)    unknown   Ciprofloxacin Other (See  Comments)    Altered mental status.   Ciprofloxacin Other (See Comments)    Confused and nausea    Penicillins Other (See Comments)    Unknown reaction Has patient had a PCN reaction causing immediate rash, facial/tongue/throat swelling, SOB or lightheadedness with hypotension: NO Has patient had a PCN reaction causing severe rash involving mucus membranes or skin necrosis: NO Has patient had a PCN reaction that required hospitalization: NO Has patient had a PCN reaction occurring within the last 10 years: NO If all of the above answers are "NO", then may proceed with Cephalosporin use.    Promethazine Other (See Comments)    Was advised by her PCP not to take this medication.   Morphine Nausea And Vomiting     Home Medications  Prior to Admission medications   Medication Sig Start Date End Date Taking? Authorizing Provider  acetaminophen (TYLENOL) 325 MG tablet Take 1-2 tablets (325-650 mg total) by mouth every 6 (six) hours as needed for mild pain. 03/31/18  Yes Love, Evlyn Kanner, PA-C  albuterol (PROVENTIL HFA;VENTOLIN HFA) 108 (90 BASE) MCG/ACT inhaler Inhale 2 puffs into the lungs every 6 (six) hours as needed for wheezing or shortness of breath.   Yes [provider]  aspirin 81 MG EC tablet Take 1 tablet (81 mg total) by mouth daily. 04/01/18  Yes Love, Evlyn Kanner, PA-C  carvedilol (COREG) 12.5 MG tablet Take 12.5 mg by mouth 2 (two) times daily. 03/18/20  Yes [provider]  clopidogrel (PLAVIX) 75 MG tablet Take 1 tablet (75 mg total) by mouth daily. 04/01/18  Yes Love, Evlyn Kanner, PA-C  esomeprazole (NEXIUM) 40 MG capsule Take 40 mg by mouth 2 (two) times daily. 04/01/20  Yes [provider]  fluticasone (FLONASE) 50 MCG/ACT nasal spray Place 2 sprays into both nostrils daily as needed for allergies or rhinitis.   Yes [provider]  Fluticasone-Salmeterol (ADVAIR) 500-50 MCG/DOSE AEPB Inhale 1 puff into the lungs 2 (two) times daily.   Yes [provider]  gabapentin (NEURONTIN) 600 MG tablet Take 1,200 mg by mouth 3 (three) times daily. 07/05/18  Yes [provider]  HYDROcodone-acetaminophen (NORCO) 7.5-325 MG tablet Take 1 tablet by mouth 2 (two) times daily as needed (breakthrough pain). 03/31/18  Yes Love, Evlyn Kanner, PA-C  ipratropium-albuterol (DUONEB) 0.5-2.5 (3) MG/3ML SOLN Inhale 3 mLs into the lungs every 6 (six) hours as needed (wheezing , Shortness of breath).  06/29/18  Yes [provider]  linaclotide Karlene Einstein) 72 MCG capsule Take  72 mcg by mouth daily as needed for constipation. 10/11/19  Yes [provider]  Menthol-Methyl Salicylate (MUSCLE RUB) 10-15 % CREA Apply 1 application topically 4 (four) times daily. To knees 03/31/18  Yes Love, Evlyn Kanner, PA-C  mirtazapine (REMERON) 7.5 MG tablet Take 1 tablet (7.5 mg total) by mouth at bedtime. Patient taking differently: Take 30 mg by mouth at bedtime.  04/26/18  Yes Love, Evlyn Kanner, PA-C  montelukast (SINGULAIR) 10 MG tablet Take 10 mg by mouth daily.    Yes [provider]  nicotine (NICODERM CQ - DOSED IN MG/24 HOURS) 14 mg/24hr patch Place 14 mg onto the skin daily. Needs during admission 04/01/18  Yes [provider]  nitroGLYCERIN (NITROSTAT) 0.4 MG SL tablet Place 0.4 mg under the tongue every 5 (five) minutes as needed for chest pain.   Yes [provider]  olopatadine (PATANOL) 0.1 % ophthalmic solution Place 1 drop into both eyes 2 (two) times daily.   Yes [provider]  ondansetron (ZOFRAN) 8 MG tablet Take 8 mg by mouth as needed for nausea or vomiting.  07/14/18  Yes [provider]  OXYCONTIN 20 MG 12 hr tablet Take 20 mg by mouth 3 (three) times daily. 04/03/20  Yes [provider]  rosuvastatin (CRESTOR) 20 MG tablet Take 20 mg by mouth at bedtime. 02/27/20  Yes [provider]  tiotropium (SPIRIVA) 18 MCG inhalation capsule Place 18 mcg into inhaler and inhale daily.   Yes [provider]  topiramate (TOPAMAX) 100 MG tablet Take 100 mg by mouth 2 (two) times daily. 02/02/20  Yes [provider]  triamcinolone cream (KENALOG) 0.5 % Apply 1 application topically daily as needed (rash).   Yes [provider]  Vitamins/Minerals TABS Take 1 tablet by mouth daily.   Yes [provider]  amLODipine (NORVASC) 10 MG tablet Take 1 tablet (10 mg total) by mouth daily. Patient not taking: Reported on 04/08/2020 07/19/18   Yvette Rack, MD  CHANTIX STARTING MONTH PAK 0.5 MG X 11 & 1 MG X 42 tablet Take 0.5 mg by mouth See admin instructions. Take 0.5 mg  Once daily  For 3 days , then increase to one 0.5 mg  Tab twice a day Patient not taking: Reported on 04/08/2020 07/13/18   [provider]  docusate sodium (COLACE) 100 MG capsule Take 1 capsule (100 mg total) by mouth 2 (two) times daily. Patient not taking: Reported on 04/08/2020 07/19/18   Yvette Rack, MD  oxyCODONE (OXYCONTIN) 10 mg 12 hr tablet Take 1 tablet (10 mg total) by mouth every 12 (twelve) hours. Patient not taking: Reported on 04/08/2020 03/31/18   Love, Evlyn Kanner, PA-C      Joneen Roach, AGACNP-BC [facility]  See Amion for personal pager PCCM on call pager [phone]  04/08/2020 3:59 PM

## 2020-04-08 NOTE — H&P (Addendum)
Date: 04/08/2020               Patient Name:  Pam Johnson MRN: [id number]  DOB: Feb 20, 1951 Age / Sex: 69 y.o., female   PCP: Patient, No Pcp Per         Medical Service: Internal Medicine Teaching Service         Attending Physician: Dr. Sid Falcon, MD    First Contact: Dr. Marva Panda Pager: [phone]  Second Contact: Dr. Truman Hayward Pager: [phone]       After Hours (After 5p/  First Contact Pager: [phone]  weekends / holidays): Second Contact Pager: [phone]   Chief Complaint: Shortness of breath  History of Present Illness:  Patient is a 69 year old female with past medical history significant for advanced COPD, hypertension, stroke who presents for evaluation of worsening shortness of breath. Pam Johnson reports that she was in her usual state of health until this morning when she began experiencing worsening abrupt onset shortness of breath. She states that she uses baseline oxygen at home at 1.5L and during her episode of dyspnea, she did not increase her oxygen requirement. She also reports of increased sputum production (Green in color) as well as cough. She states that she run out of her advair about a week ago. She is well compliant with all her inhalers. This morning she took her duonebs during her dyspneic episode and that helped her symptoms.   She also reports of nausea, denies emesis. She endorses chronic 4 pillow orthopnea as well as dyspnea on exertion.   Meds:  *Patient verified respiratory medications, does not recall other medications Respiratory: *Albuterol 2 puffs every 6 hours as needed *Fluticasone-salmeterol (Advair) 500-50 mcg/dose 1 puff twice daily *Montelukast (Singulair) 10 mg daily *Tiotropium (Spiriva) 18 mcg daily *Ipratropium-Albuterol (DuoNebs) every 6 hours as needed Cardiac: *Amlodipine 10 mg daily *Carvedilol 12.5 mg twice daily *Nitrostat 0.4 mg as needed for chest pain Neurologic: *Aspirin 81 mg daily *Plavix 75 mg daily *Rosuvastatin 20 mg  at bedtime Psychiatric: *Mirtazapine 7.5 mg at bedtime  *Topiramate 20 mg twice daily Analgesic: *Acetaminophen as needed *Gabapentin 1200 mg 3 times daily *Norco 7.5-325 mg twice daily as needed for breakthrough pain *Oxycodone 10 mg 12-hour tablet-1 tablet 3 times daily Gastrointestinal: *Esomeprazole 40 mg twice daily *Linzess 72 mcg daily as needed for constipation *Colace 20 mg twice daily *Zofran 8 mg as needed for nausea/vomiting Other: *Nicotine patch 14 mg/day *Patanol 0.1% eye drops - 1 drop both eyes twice daily *Chantix starter pack *Vitamin daily *Flonase as needed   Allergies: Allergies as of 04/08/2020 - Review Complete 04/08/2020  Allergen Reaction Noted  . Acyclovir and related Other (See Comments) 03/22/2018  . Ciprofloxacin Other (See Comments) 10/18/2015  . Ciprofloxacin Other (See Comments) 03/09/2016  . Penicillins Other (See Comments) 10/18/2015  . Promethazine Other (See Comments) 08/15/2013  . Morphine Nausea And Vomiting 11/23/2017   Past Medical History:  Diagnosis Date  . Acid reflux   . COPD (chronic obstructive pulmonary disease) ([place])   . Hypertension   . Sacral fracture ([place])   . Spinal stenosis     Family History:  Family History  Problem Relation Age of Onset  . Hypertension Mother   . Hypertension Father     Social History:  *Reports smoking 1 pack of cigarettes/week, states at heaviest usage she smoked 1 pack/day, smoking since age of 87 *Reports occasional alcohol usage, last drink 1 week prior *Denies recreational drug usage *Lives with husband, son  lives in the area. Gets around house with a walker, husband helps with food preparation, medication management  Review of Systems: A complete ROS was negative except as per HPI.   Physical Exam: Blood pressure (!) 166/103, pulse 100, resp. rate 15, SpO2 98 %. Physical Exam Constitutional:      Appearance: She is ill-appearing. She is not diaphoretic.     Comments: Cachetic   HENT:     Head: Normocephalic and atraumatic.  Eyes:     General:        Right eye: No discharge.        Left eye: No discharge.  Neck:     Trachea: No tracheal deviation.  Cardiovascular:     Rate and Rhythm: Normal rate and regular rhythm.     Heart sounds: No murmur heard.  No friction rub. No gallop.   Pulmonary:     Effort: Pulmonary effort is normal. No respiratory distress.     Breath sounds: Wheezing (diffuse) present. No rales.  Abdominal:     General: There is no distension.     Palpations: Abdomen is soft.     Tenderness: There is no abdominal tenderness. There is no guarding or rebound.  Musculoskeletal:        General: No tenderness or deformity. Normal range of motion.     Cervical back: Normal range of motion.  Skin:    General: Skin is warm and dry.     Findings: No erythema or rash.  Neurological:     Mental Status: She is alert.     Coordination: Coordination normal.  Psychiatric:        Mood and Affect: Mood is anxious.        Behavior: Behavior normal.        Cognition and Memory: Memory normal.        Judgment: Judgment normal.     EKG: personally reviewed my interpretation is sinus rhythm without evidence for acute ischemia, QTc of 440 msec (Bazett formula)  CXR: personally reviewed my interpretation is hyperinflated, no acute cardiopulmonary process evident  CBC Latest Ref Rng & Units 04/08/2020 07/19/2018 07/18/2018  WBC 4.0 - 10.5 K/uL 14.2(H) 7.8 7.3  Hemoglobin 12.0 - 15.0 g/dL 06.2 10.6(L) 11.9(L)  Hematocrit 36 - 46 % 48.4(H) 33.1(L) 39.1  Platelets 150 - 400 K/uL 407(H) 361 420(H)   CMP Latest Ref Rng & Units 04/08/2020 07/19/2018 07/18/2018  Glucose 70 - 99 mg/dL 376(E) 86 831(D)  BUN 8 - 23 mg/dL 5(L) 6(L) 7(L)  Creatinine 0.44 - 1.00 mg/dL 1.76 1.60 7.37  Sodium 135 - 145 mmol/L 141 139 138  Potassium 3.5 - 5.1 mmol/L 4.1 3.6 4.8  Chloride 98 - 111 mmol/L 99 104 103  CO2 22 - 32 mmol/L 32 25 20(L)  Calcium 8.9 - 10.3 mg/dL 9.4 8.9 9.1   Total Protein 6.5 - 8.1 g/dL 1.0(G) 6.2(L) -  Total Bilirubin 0.3 - 1.2 mg/dL 0.5 0.5 -  Alkaline Phos 38 - 126 U/L 69 67 -  AST 15 - 41 U/L 23 19 -  ALT 0 - 44 U/L 13 10 -   BNP: 305.1  Lactic Acid: 2.0 -> 1.4  Troponin: 51 -> 99  D-Dimer: 1.32  TTE (03/24/2018): - Left ventricle: Inferior and septal hypokinesis There was mild  focal basal hypertrophy of the septum. Systolic function was  normal. The estimated ejection fraction was in the range of 50%  to 55%. Left ventricular diastolic function parameters were  normal.  -  Mitral valve: Calcified annulus. There was mild regurgitation.  Valve area by pressure half-time: 1.58 cm^2.  - Left atrium: The atrium was moderately dilated.  - Atrial septum: No defect or patent foramen ovale was identified.   Chest Radiograph (04/08/20): IMPRESSION: No acute disease. Hiatal hernia. Aortic Atherosclerosis (ICD10-I70.0) and Emphysema (ICD10-J43.9).  Spirometry (09/20/2017): FEV1 PRE 0.85 (L) 1.63815 - 2.72911 L [facility]   FEV1/FVC PRE 37.21 (L) 66.99 - 86.578 % [facility]   FVC PRE 2.28 2.2209 - 3.51102 L [facility]   PEF PRE 3.06 (L) 4.00317 - 7.19521 L/s [facility]   Vol extrap pre 0.01 L [facility]   FIVC PRE 1.99 (L) 2.2209 - 3.51102 L [facility]   PIF PRE 2.33 (L) 3.5586 - 3.5586 L/s [facility]   FIF50% PRED 2.29 1.65953 - 4.52183 L/s [facility]   FEV6 PRE 1.88 (L) 2.11384 - 3.37982 L [facility]   FEV1/FEV6 PRE 45.06 (L) 70.9912 - 88.5912 % [facility]   FEF50% PRE 0.3 L/s [facility]   FEF25-75% PRE 0.28 (L) 0.80658 - 3.11372 L/s [facility]   LKG/MWN02 pre 13.12 % [facility]   ISOFEF25-75 PRE 0.28 L/s [facility]   FET100% Change 10.88 sec [facility]    FEV1% of 38.9% (GOLD Stage III in 2018)  CTA Chest (04/08/20): IMPRESSION: 1. No evidence of acute pulmonary artery filling defects to suggest pulmonary embolism. 2. Mild basilar airways thickening and some scattered secretions with some faint peribronchovascular opacity which could  reflect sequela of aspiration. 3. 9 mm nodule in the lateral basal segment right lower lobe possibly related to the acute process though overall indeterminate. Additional 4 mm nodule in the left upper lobe as well. Non-contrast chest CT at 3-6 months is recommended. If the nodules are stable at time of repeat CT, then future CT at 18-24 months (from today's scan) is considered optional for low-risk patients, but is recommended for high-risk patients. This recommendation follows the consensus statement: Guidelines for Management of Incidental Pulmonary Nodules Detected on CT Images: From the Fleischner Society 2017; Radiology 2017; 284:228-243. 4. Additional remote granulomatous disease in the left lung apex and left lung apex. 5. Recurrent moderate hiatal hernia with CT features of prior fundoplication. 6. Chronic appearing T12 compression deformity with vertebroplasty changes in approximately 50% height loss anteriorly as well as 6 mm retropulsion of the superior endplate fracture fragment with moderate resulting canal stenosis. 7. More age indeterminate inferior endplate compression deformity at L1 with approximately 50% height loss centrally. Could correlate for point tenderness. 8. Aortic Atherosclerosis (ICD10-I70.0) 9. Emphysema (ICD10-J43.9).  Assessment & Plan by Problem: Active Problems:   Acute respiratory failure with hypoxia [facility])  Patient is a 69 year old female with past medical history significant for advanced COPD, hypertension, stroke who presents for evaluation of worsening shortness of breath.  # Acute on chronic hypoxic respiratory failure:  # COPD Exacerbation Patient presents with 1 day of worsening shortness of breath in the setting of increased cough and sputum production.  Clinical picture is consistent with COPD exacerbation.  Patient was gold stage 3 (FEV1 of 38.9% predicted) in 2018, and condition has likely progressed since then. However, given the  abruptness of onset and elevated D-dimer, will check CT angiogram chest to rule out pulmonary embolism. Well's score of 1.5.  Patient is afebrile but has elevated white count to 14.2.  No evidence for infiltrate on chest x-ray, will check procalcitonin and evaluate lung parenchyma on CT angiogram. Will cancel vanc+cefepime.  Patient does have chronic symptom history consistent with heart failure, EF was 50-55% on TTE from May 2019.  Patient without evidence of volume overload on exam, heart failure likely not cause of patient's acute issue.  Patient is currently saturating at goal while on 2 L nasal cannula (home requirement of 1.5 L).   *Goal SpO2 88-92% *Azithromycin 500 mg followed by 250 mg once daily for 4 days *Methylprednisolone 60 mg every 8 hours, plan to transition to oral prednisone when patient improves *Check RVP *Ipratropium-Albuterol (DuoNebs) every 6 hours *Mometasone-formoterol (Dulera) 200-5 2 puffs twice daily *Check ABG *Followup CTA chest  # Hypertension Patient hypertensive to 233/106 on admission, started on nitro drip. Will continue nitro drip with goal SBP 160-170. Plan to start home anti-hypertensives and transition off drip in AM *Nitro drip, goal SBP 160-170 *Hold home amlodipine 10 mg daily + carvedilol 12.5 mg twice daily for now  # History of CVA Continue home aspirin 81 mg daily + plavix 75 mg daily + rosuvastatin 20 mg at bedtime  # GERD: Continue PPI - pantoprazole 40 mg daily  # Tobacco use disorder: Nictoine patch 14 mg daily  # Chronic pain: Continue home gabapentin at reduced dose (600 mg three times daily) + oxycodone 5 mg every 6 hours as needed (reduced dose given respiratory status)  # Lung Nodule - Right lower lobe: Non-contrast chest CT at 3-6 months. If stable, then repeat at 18-24 months  Diet: Heart DVT Ppx: Lovenox 30 mg daily Code Status: DNR/DNI Dispo: Admit patient to Inpatient with expected length of stay greater than 2  midnights.  Signed: Katherine Roan, MD 04/08/2020, 8:31 PM

## 2020-04-09 DIAGNOSIS — Z681 Body mass index (BMI) 19 or less, adult: Secondary | ICD-10-CM

## 2020-04-09 DIAGNOSIS — L8990 Pressure ulcer of unspecified site, unspecified stage: Secondary | ICD-10-CM | POA: Diagnosis present

## 2020-04-09 DIAGNOSIS — J9621 Acute and chronic respiratory failure with hypoxia: Secondary | ICD-10-CM

## 2020-04-09 LAB — RESPIRATORY PANEL BY PCR

## 2020-04-09 LAB — CBC
HCT: 40.6 % (ref 36.0–46.0)
Hemoglobin: 13 g/dL (ref 12.0–15.0)
MCH: 29.2 pg (ref 26.0–34.0)
MCHC: 32 g/dL (ref 30.0–36.0)
MCV: 91.2 fL (ref 80.0–100.0)
Platelets: 340 10*3/uL (ref 150–400)
RBC: 4.45 MIL/uL (ref 3.87–5.11)
RDW: 15.2 % (ref 11.5–15.5)
WBC: 5.4 10*3/uL (ref 4.0–10.5)
nRBC: 0 % (ref 0.0–0.2)

## 2020-04-09 LAB — COMPREHENSIVE METABOLIC PANEL
ALT: 12 U/L (ref 0–44)
AST: 21 U/L (ref 15–41)
Albumin: 3.4 g/dL — ABNORMAL LOW (ref 3.5–5.0)
Alkaline Phosphatase: 53 U/L (ref 38–126)
Anion gap: 12 (ref 5–15)
BUN: 9 mg/dL (ref 8–23)
CO2: 22 mmol/L (ref 22–32)
Calcium: 9.1 mg/dL (ref 8.9–10.3)
Chloride: 104 mmol/L (ref 98–111)
Creatinine, Ser: 0.53 mg/dL (ref 0.44–1.00)
GFR calc Af Amer: >60 mL/min (ref >60)
GFR calc non Af Amer: >60 mL/min (ref >60)
Glucose, Bld: 145 mg/dL — ABNORMAL HIGH (ref 70–99)
Potassium: 3.5 mmol/L (ref 3.5–5.1)
Sodium: 138 mmol/L (ref 135–145)
Total Bilirubin: 0.2 mg/dL — ABNORMAL LOW (ref 0.3–1.2)
Total Protein: 7.2 g/dL (ref 6.5–8.1)

## 2020-04-09 LAB — MRSA PCR SCREENING: MRSA by PCR: POSITIVE — AB

## 2020-04-09 MED ORDER — MUPIROCIN 2 % EX OINT
1.0000 "application " | TOPICAL_OINTMENT | Freq: Two times a day (BID) | CUTANEOUS | Status: DC
  Administered 2020-04-09 – 2020-04-10 (×3): 1 via NASAL
  Filled 2020-04-09 (×3): qty 22

## 2020-04-09 MED ORDER — CHLORHEXIDINE GLUCONATE CLOTH 2 % EX PADS
6.0000 | MEDICATED_PAD | Freq: Every day | CUTANEOUS | Status: DC
  Administered 2020-04-09 – 2020-04-10 (×3): 6 via TOPICAL

## 2020-04-09 MED ORDER — PROCHLORPERAZINE EDISYLATE 10 MG/2ML IJ SOLN
10.0000 mg | Freq: Once | INTRAMUSCULAR | Status: AC
  Administered 2020-04-09: 10 mg via INTRAVENOUS
  Filled 2020-04-09: qty 2

## 2020-04-09 MED ORDER — FAMOTIDINE 20 MG PO TABS
20.0000 mg | ORAL_TABLET | ORAL | Status: DC | PRN
  Administered 2020-04-09: 20 mg via ORAL
  Filled 2020-04-09: qty 1

## 2020-04-09 MED ORDER — IPRATROPIUM-ALBUTEROL 0.5-2.5 (3) MG/3ML IN SOLN
3.0000 mL | Freq: Two times a day (BID) | RESPIRATORY_TRACT | Status: DC
  Administered 2020-04-09 – 2020-04-10 (×2): 3 mL via RESPIRATORY_TRACT
  Filled 2020-04-09 (×2): qty 3

## 2020-04-09 MED ORDER — LISINOPRIL 10 MG PO TABS
10.0000 mg | ORAL_TABLET | Freq: Every day | ORAL | Status: DC
  Administered 2020-04-09 – 2020-04-10 (×2): 10 mg via ORAL
  Filled 2020-04-09 (×2): qty 1

## 2020-04-09 MED ORDER — AMLODIPINE BESYLATE 10 MG PO TABS
10.0000 mg | ORAL_TABLET | Freq: Every day | ORAL | Status: DC
  Administered 2020-04-09 – 2020-04-10 (×2): 10 mg via ORAL
  Filled 2020-04-09 (×2): qty 1

## 2020-04-09 MED ORDER — GUAIFENESIN-DM 100-10 MG/5ML PO SYRP
5.0000 mL | ORAL_SOLUTION | ORAL | Status: DC | PRN
  Filled 2020-04-09: qty 5

## 2020-04-09 MED ORDER — ALBUTEROL SULFATE (2.5 MG/3ML) 0.083% IN NEBU: 2.5000 mg | INHALATION_SOLUTION | RESPIRATORY_TRACT | Status: DC | PRN

## 2020-04-09 MED ORDER — PREDNISONE 20 MG PO TABS
40.0000 mg | ORAL_TABLET | Freq: Every day | ORAL | Status: DC
  Administered 2020-04-09 – 2020-04-10 (×2): 40 mg via ORAL
  Filled 2020-04-09 (×3): qty 2

## 2020-04-09 NOTE — Progress Notes (Signed)
Pt's bp 163/95 within goal range of Nitro drip. RN will not titrate at this time.  Drip continues to run at 19mcg/hr.  Will continue to monitor.

## 2020-04-09 NOTE — Progress Notes (Signed)
Pt's bp 196/102 upon recheck after increasing Nitro rate.  Nitro rate increased by to 18 ml/hour. Will recheck bp.

## 2020-04-09 NOTE — Evaluation (Signed)
Clinical/Bedside Swallow Evaluation Patient Details  Name: Pam Johnson MRN: [id number] Date of Birth: 08-01-1951  Today's Date: 04/09/2020 Time: SLP Start Time (ACUTE ONLY): 1150 SLP Stop Time (ACUTE ONLY): 1205 SLP Time Calculation (min) (ACUTE ONLY): 15 min  Past Medical History:  Past Medical History:  Diagnosis Date  . Acid reflux   . COPD (chronic obstructive pulmonary disease) ([place])   . Hypertension   . Sacral fracture ([place])   . Spinal stenosis    Past Surgical History:  Past Surgical History:  Procedure Laterality Date  . ABDOMINAL HYSTERECTOMY    . CHOLECYSTECTOMY    . HERNIA REPAIR    . IR ANGIO INTRA EXTRACRAN SEL COM CAROTID INNOMINATE UNI L MOD SED  03/22/2018  . IR ANGIO VERTEBRAL SEL SUBCLAVIAN INNOMINATE UNI R MOD SED  03/22/2018  . IR CT HEAD LTD  03/22/2018  . IR PERCUTANEOUS ART THROMBECTOMY/INFUSION INTRACRANIAL INC DIAG ANGIO  03/22/2018  . LEFT HEART CATH AND CORONARY ANGIOGRAPHY  2000   Non-obstructive CAD  . NISSEN FUNDOPLICATION    . RADIOLOGY WITH ANESTHESIA N/A 03/22/2018   Procedure: IR WITH ANESTHESIA;  Surgeon: Luanne Bras, MD;  Location: [facility];  Service: Radiology;  Laterality: N/A;  . TOTAL HIP ARTHROPLASTY Right 11/2017   HPI:  69 year old female with past medical history significant for advanced COPD (5L 02 at home), GERD, hypertension, right CVA 2019 admitted with acute on chronic hypoxic respiratory failure, FTT with chronic cachexia.  Hx of SLP f/u for cognition s/p CVA in 2019; no dysphagia at that time.   Assessment / Plan / Recommendation Clinical Impression  Pt with self-limiting participation in swallow assessment.  She endorsed globus and current heartburn at time of evaluation (RN informed).  Dentures were not present.  Pt demonstrated one episode of coughing after consumption of liquids; otherwise, she presented with adequate oral mastication despite absence of teeth, the appearance of a swift swallow response, and no other s/s  of aspiration despite multiple, successive boluses of thin liquid during further trials.  There was adequate coordination of swallow/respiratory cycles. No signs of acute change in swallowing function - recommend continuing current diet (regular solids, thin liquids). SLP services will sign off. If she declines or there are increased s/s of dysphagia during meals, please reconsult.  Pt will not need cognitive/linguistic assessment at this time given no acute neuro changes.    SLP Visit Diagnosis: Dysphagia, unspecified (R13.10)    Aspiration Risk  No limitations    Diet Recommendation     Medication Administration: Whole meds with liquid    Other  Recommendations Oral Care Recommendations: Oral care BID   Follow up Recommendations None      Frequency and Duration            Prognosis        Swallow Study   General Date of Onset: 04/08/20 HPI: 69 year old female with past medical history significant for advanced COPD (5L 02 at home), GERD, hypertension, right CVA 2019 admitted with acute on chronic hypoxic respiratory failure, FTT with chronic cachexia.  Hx of SLP f/u for cognition s/p CVA in 2019; no dysphagia at that time. Type of Study: Bedside Swallow Evaluation Diet Prior to this Study: Regular;Thin liquids Temperature Spikes Noted: No Respiratory Status: Nasal cannula History of Recent Intubation: No Behavior/Cognition: Alert;Cooperative Oral Cavity Assessment: Within Functional Limits Oral Care Completed by SLP: No Oral Cavity - Dentition: Edentulous Self-Feeding Abilities: Able to feed self Patient Positioning: Upright in bed Baseline Vocal Quality:  Hoarse Volitional Cough: Strong Volitional Swallow: Able to elicit    Oral/Motor/Sensory Function Overall Oral Motor/Sensory Function: Other (comment) (baseline facial asymmetry left)   Ice Chips Ice chips: Within functional limits   Thin Liquid Thin Liquid: Within functional limits    Nectar Thick Nectar Thick  Liquid: Not tested   Honey Thick Honey Thick Liquid: Not tested   Puree Puree: Within functional limits   Solid     Solid: Within functional limits      Blenda Mounts Laurice 04/09/2020,2:18 PM  Marchelle Folks L. Samson Frederic, MA CCC/SLP Acute Rehabilitation Services Office number [phone] Pager [phone]

## 2020-04-09 NOTE — Progress Notes (Signed)
Subjective: HD 1 Overnight, patient admitted for acute respiratory failure in setting of COPD exacerbation.  [name] was evaluated at bedside this morning. Patient was on 2-3L O2 via [place] and endorsing trouble breathing. She notes that the room is very hot which is exacerbating her shortness of breath.   Objective:  Vital signs in last 24 hours: Vitals:   04/08/20 2133 04/08/20 2200 04/08/20 2235 04/09/20 0530  BP: (!) 153/94 (!) 199/102 (!) 168/85 (!) 142/83  Pulse: (!) 101 91 91 95  Resp: (!) 22 20 19 18   Temp: 98.5 F (36.9 C) 99.7 F (37.6 C) 98.3 F (36.8 C) 99.4 F (37.4 C)  TempSrc: Oral Oral Oral Oral  SpO2: 99% 98% 96% 96%  Weight:  41.9 kg    Height:  5\' 4"  (1.626 m)     CBC Latest Ref Rng & Units 04/09/2020 04/08/2020 04/08/2020  WBC 4.0 - 10.5 K/uL 5.4 - 14.2(H)  Hemoglobin 12.0 - 15.0 g/dL 13.0 13.6 15.0  Hematocrit 36 - 46 % 40.6 40.0 48.4(H)  Platelets 150 - 400 K/uL 340 - 407(H)   BMP Latest Ref Rng & Units 04/09/2020 04/08/2020 04/08/2020  Glucose 70 - 99 mg/dL 145(H) - 154(H)  BUN 8 - 23 mg/dL 9 - 5(L)  Creatinine 0.44 - 1.00 mg/dL 0.53 - 0.50  Sodium 135 - 145 mmol/L 138 139 141  Potassium 3.5 - 5.1 mmol/L 3.5 3.2(L) 4.1  Chloride 98 - 111 mmol/L 104 - 99  CO2 22 - 32 mmol/L 22 - 32  Calcium 8.9 - 10.3 mg/dL 9.1 - 9.4   Physical Exam  Constitutional: Appears chronically ill-appearing and cachectic.  Head: Normocephalic and atraumatic.  Eyes: Conjunctivae are normal. EOMI Cardiovascular: Normal rate, regular rhythm and normal heart sounds, no m/r/g noted  Respiratory: Using accessory muscles, diminished breath sounds diffusely with some expiratory wheezing noted anteriorly; no rales or rhonchi; on 3L O2 via  and saturating >92% GI: Soft. Bowel sounds are normal. No distension. There is no tenderness.  Musculoskeletal: No edema.  Neurological: Is alert and oriented x3, no apparent focal deficits noted  Skin: Not diaphoretic. No erythema.     Assessment/Plan:  Principal Problem:   Acute respiratory failure with hypoxia (H[facility]) Active Problems:   Hypertensive urgency   Chronic pain disorder   Protein-calorie malnutrition, severe ([facility])   Tobacco use disorder   COPD exacerbation ([place]) [name] is a 69 year old female with PMHx of COPD Stage IV, hypertension, and CVA in 2019 admitted for acute on chronic hypoxic respiratory failure.   Acute on chronic hypoxic respiratory failure 2/2 COPD exacerbation: Patient presented with acute dyspnea and worsening productive cough, consistent with COPD exacerbation. She has a history of COPD stage III with most recent FEV1 38.9% of predicted in 2018. Patient afebrile but with some leukocytosis. She is currently saturating >92% on 3L O2 but does endorse ongoing dyspnea with increased sputum production. On examination, diffusely diminished breath sounds with expiratory wheezing auscultated anteriorly.  - Continue oxygen supplementation with goal SpO2 88-92% - Continue azithromycin 250mg  daily  - Prednisone 40mg  daily  - Duonebs q6h scheduled - Dulera 2 puff bid  - Guaifenesin-dextromethorphan q4h prn for cough  Hypertensive urgency:  Patient presented with SBP in 230s on admission and started on nitro gtt. SBP this AM 170s. Nitro drip to be discontinued with resumption of home meds. Will hold home beta blocker in setting of COPD exacerbation. Will add lisinopril instead.  - D/c nitro gtt -  Amlodipine 10mg  daily - Lisinopril 10mg  daily - Continue to hold home coreg for now  Hx of CVA w/ L sided weakness:  - Continue aspirin 81mg  + plavix 75mg  daily - Continue crestor 20mg  daily  FEN/GI: Diet: HH Fluids: None  Electrolytes: Monitor and replete prn  DVT Prophylaxis: Eliquis Code status: DNR/DNI   Prior to Admission Living Arrangement: Home Anticipated Discharge Location: Home w/HH vs SNF Barriers to Discharge: Continued medical management  Dispo: Anticipated discharge  in approximately 2-3 day(s).   , MD  Internal Medicine, PGY-1 04/09/2020, 6:54 AM Pager: [phone] After 5pm on weekdays and 1pm on weekends: On Call pager [phone]

## 2020-04-09 NOTE — Progress Notes (Signed)
Pt using accessory muscles and tachypneic stating "I can't breathe". Pulse ox 98% on 3L [place]. Lung sounds clear/dim.  Respiratory therapy consulted and on way to give scheduled breathing treatment.

## 2020-04-09 NOTE — Progress Notes (Signed)
Pt states that she is leaving to go home so she can get cough syrup.  Pt reassured that RN will page MD for cough syrup and leaving is not necessary.

## 2020-04-09 NOTE — Progress Notes (Signed)
Morning bp 195/93, Nitro drip titrated up . Nitro drip now running at 16.5 ml/hr.

## 2020-04-09 NOTE — Progress Notes (Signed)
CRITICAL VALUE ALERT  Critical Value:  Troponin 104  Date & Time Notified:  04/08/20 2320  Provider Notified: Ronelle Nigh  Orders Received/Actions taken: no orders received

## 2020-04-09 NOTE — Progress Notes (Signed)
CRITICAL VALUE ALERT  Critical Value:  MRSA positive   Date & Time Notied:  04/09/2020 12:58 AM   Provider Notified:   Orders Received/Actions taken: orders placed per protocol

## 2020-04-10 LAB — BASIC METABOLIC PANEL
Anion gap: 11 (ref 5–15)
BUN: 8 mg/dL (ref 8–23)
CO2: 24 mmol/L (ref 22–32)
Calcium: 8.9 mg/dL (ref 8.9–10.3)
Chloride: 98 mmol/L (ref 98–111)
Creatinine, Ser: 0.46 mg/dL (ref 0.44–1.00)
GFR calc Af Amer: >60 mL/min (ref >60)
GFR calc non Af Amer: >60 mL/min (ref >60)
Glucose, Bld: 112 mg/dL — ABNORMAL HIGH (ref 70–99)
Potassium: 3.2 mmol/L — ABNORMAL LOW (ref 3.5–5.1)
Sodium: 133 mmol/L — ABNORMAL LOW (ref 135–145)

## 2020-04-10 LAB — CBC
HCT: 36.5 % (ref 36.0–46.0)
Hemoglobin: 12.1 g/dL (ref 12.0–15.0)
MCH: 29.2 pg (ref 26.0–34.0)
MCHC: 33.2 g/dL (ref 30.0–36.0)
MCV: 88.2 fL (ref 80.0–100.0)
Platelets: 288 10*3/uL (ref 150–400)
RBC: 4.14 MIL/uL (ref 3.87–5.11)
RDW: 15 % (ref 11.5–15.5)
WBC: 7.3 10*3/uL (ref 4.0–10.5)
nRBC: 0 % (ref 0.0–0.2)

## 2020-04-10 MED ORDER — METOPROLOL TARTRATE 50 MG PO TABS: 50.0000 mg | ORAL_TABLET | Freq: Two times a day (BID) | ORAL | 0 refills | Status: AC

## 2020-04-10 MED ORDER — AMLODIPINE BESYLATE 10 MG PO TABS: 10.0000 mg | ORAL_TABLET | Freq: Every day | ORAL | 0 refills | Status: DC

## 2020-04-10 MED ORDER — METOPROLOL TARTRATE 50 MG PO TABS
50.0000 mg | ORAL_TABLET | Freq: Two times a day (BID) | ORAL | Status: DC
  Administered 2020-04-10: 50 mg via ORAL
  Filled 2020-04-10: qty 1

## 2020-04-10 MED ORDER — AZITHROMYCIN 250 MG PO TABS: 250.0000 mg | ORAL_TABLET | Freq: Every day | ORAL | 0 refills | Status: AC

## 2020-04-10 MED ORDER — POTASSIUM CHLORIDE 20 MEQ PO PACK
40.0000 meq | PACK | Freq: Two times a day (BID) | ORAL | Status: DC
  Administered 2020-04-10: 40 meq via ORAL
  Filled 2020-04-10: qty 2

## 2020-04-10 MED ORDER — RAMELTEON 8 MG PO TABS
8.0000 mg | ORAL_TABLET | Freq: Every evening | ORAL | Status: DC | PRN
  Administered 2020-04-10: 8 mg via ORAL
  Filled 2020-04-10 (×2): qty 1

## 2020-04-10 MED ORDER — ACETAMINOPHEN 325 MG PO TABS
650.0000 mg | ORAL_TABLET | Freq: Four times a day (QID) | ORAL | Status: DC | PRN
  Administered 2020-04-10: 650 mg via ORAL
  Filled 2020-04-10: qty 2

## 2020-04-10 MED ORDER — GUAIFENESIN-DM 100-10 MG/5ML PO SYRP: 5.0000 mL | ORAL_SOLUTION | ORAL | 0 refills | Status: DC | PRN

## 2020-04-10 MED ORDER — PREDNISONE 20 MG PO TABS: 40.0000 mg | ORAL_TABLET | Freq: Every day | ORAL | 0 refills | Status: AC

## 2020-04-10 NOTE — Progress Notes (Signed)
Subjective: HD 2 Overnight, no acute events reported.   Pam Johnson was evaluated at bedside this morning. Patient endorses feeling better and is coughing less. She was able to get out of bed and walk this morning without significant shortness of breath. She feels like she is back to baseline.   Discussed with patient's husband, Pam Johnson., regarding discharge planning and recommendations for continued steroids, azithromycin and to follow up with PCP on discharge. Pam Johnson follows with Dr. Freda Johnson in [place], [place]. Also discussed changing carvedilol to metoprolol on discharge. Pam Johnson expresses understanding. All questions and concerns addressed.   Objective:  Vital signs in last 24 hours: Vitals:   04/09/20 1600 04/09/20 2035 04/09/20 2045 04/10/20 0736  BP:   (!) 141/72 (!) 170/87  Pulse: 90  85 74  Resp:   20 16  Temp:   98.8 F (37.1 C) 98.3 F (36.8 C)  TempSrc:   Oral   SpO2: 100% 99% 100% (!) 83%  Weight:      Height:       CBC Latest Ref Rng & Units 04/10/2020 04/09/2020 04/08/2020  WBC 4.0 - 10.5 K/uL 7.3 5.4 -  Hemoglobin 12.0 - 15.0 g/dL 22.0 25.4 27.0  Hematocrit 36 - 46 % 36.5 40.6 40.0  Platelets 150 - 400 K/uL 288 340 -   BMP Latest Ref Rng & Units 04/10/2020 04/09/2020 04/08/2020  Glucose 70 - 99 mg/dL 623(J) 628(B) -  BUN 8 - 23 mg/dL 8 9 -  Creatinine 1.51 - 1.00 mg/dL 7.61 6.07 -  Sodium 371 - 145 mmol/L 133(L) 138 139  Potassium 3.5 - 5.1 mmol/L 3.2(L) 3.5 3.2(L)  Chloride 98 - 111 mmol/L 98 104 -  CO2 22 - 32 mmol/L 24 22 -  Calcium 8.9 - 10.3 mg/dL 8.9 9.1 -   Physical Exam  Constitutional: Appears chronically ill-appearing and cachectic.  Head: Normocephalic and atraumatic.  Eyes: Conjunctivae are normal. EOMI Cardiovascular: Normal rate, regular rhythm and normal heart sounds, no m/r/g noted  Respiratory: No respiratory distress; on 3L O2, turned down to 1.5L at bedside and maintained saturations >95%; improved breath sounds with  some diffuse expiratory wheezing noted at mid and apical lung bases bilaterally; no rales or rhonchi.  GI: Soft. Bowel sounds are normal. No distension. There is no tenderness.  Musculoskeletal: No edema.  Neurological: Is alert and oriented x3, no apparent focal deficits noted  Skin: Not diaphoretic. No erythema.   Assessment/Plan:  Principal Problem:   Acute on chronic respiratory failure with hypoxia (HCC) Active Problems:   Hypertensive urgency   Chronic pain disorder   Protein-calorie malnutrition, severe (HCC)   Tobacco use disorder   Pulmonary nodule seen on imaging study   COPD exacerbation (HCC)   Pressure injury of skin   BMI less than 19,adult [name] is a 69 year old female with PMHx of COPD Stage IV, hypertension, and CVA in 2019 admitted for acute on chronic hypoxic respiratory failure.   Acute on chronic hypoxic respiratory failure 2/2 COPD exacerbation: Patient presented with acute dyspnea and worsening productive cough, consistent with COPD exacerbation. She has a history of COPD stage III with most recent FEV1 38.9% of predicted in 2018. She was treated with scheduled duonebs, azithromycin and prednisone with significant improvement in symptoms. Oxygen requirements are also back to baseline. Patient able to ambulate without significant dyspnea on exertion. Patient has improved lung exam. She is eager for discharge to home. Discussed continuing with  antibiotics and steroid on discharge and to follow up with PCP.  - Continue oxygen supplementation with goal SpO2 88-92% - Continue azithromycin 250mg  day 3/5 - Prednisone 40mg  daily day 3/5 - Dulera 2 puff bid  - Guaifenesin-dextromethorphan q4h prn for cough  Hypertensive urgency:  Patient presented with SBP in 230s on admission and started on nitro gtt. SBP this AM 170s. Home coreg was held in setting of COPD exacerbation and patient started on lisinopril yesterday. Patient to continue amlodipine on discharge.  Will recommend for metoprolol instead of carvedilol on discharge given that it is more selective.  - Amlodipine 10mg  daily - Metoprolol 50mg  bid   Hx of CVA w/ L sided weakness:  - Continue aspirin 81mg  + plavix 75mg  daily - Continue crestor 20mg  daily  FEN/GI: Diet: HH Fluids: None  Electrolytes: Monitor and replete prn  DVT Prophylaxis: Eliquis Code status: DNR/DNI   Prior to Admission Living Arrangement: Home Anticipated Discharge Location: Home w/HH  Barriers to Discharge:None Dispo: Anticipated discharge today  Harvie Heck, MD  Internal Medicine, PGY-1 04/10/2020, 7:45 AM Pager: [phone] After 5pm on weekdays and 1pm on weekends: On Call pager [phone]

## 2020-04-10 NOTE — Discharge Summary (Signed)
Name: ROYALTY FAKHOURI MRN: [id number] DOB: [birth year] 69 y.o. PCP: Patient, No Pcp Per  Date of Admission: 04/08/2020  1:09 PM Date of Discharge: 04/10/2020 Attending Physician: Dr. Carlynn Purl  Discharge Diagnosis: 1. Acute on chronic hypoxic respiratory failure 2/2 COPD exacerbation 2. Hypertensive urgency 3. Incidental CTA chest finding   Discharge Medications: Allergies as of 04/10/2020      Reactions   Acyclovir And Related Other (See Comments)   unknown   Ciprofloxacin Other (See Comments)   Altered mental status.   Ciprofloxacin Other (See Comments)   Confused and nausea   Penicillins Other (See Comments)   Unknown reaction Has patient had a PCN reaction causing immediate rash, facial/tongue/throat swelling, SOB or lightheadedness with hypotension: NO Has patient had a PCN reaction causing severe rash involving mucus membranes or skin necrosis: NO Has patient had a PCN reaction that required hospitalization: NO Has patient had a PCN reaction occurring within the last 10 years: NO If all of the above answers are "NO", then may proceed with Cephalosporin use.   Promethazine Other (See Comments)   Was advised by her PCP not to take this medication.   Morphine Nausea And Vomiting      Medication List    STOP taking these medications   carvedilol 12.5 MG tablet Commonly known as: COREG     TAKE these medications   acetaminophen 325 MG tablet Commonly known as: TYLENOL Take 1-2 tablets (325-650 mg total) by mouth every 6 (six) hours as needed for mild pain.   albuterol 108 (90 Base) MCG/ACT inhaler Commonly known as: VENTOLIN HFA Inhale 2 puffs into the lungs every 6 (six) hours as needed for wheezing or shortness of breath.   amLODipine 10 MG tablet Commonly known as: NORVASC Take 1 tablet (10 mg total) by mouth daily.   aspirin 81 MG EC tablet Take 1 tablet (81 mg total) by mouth daily.   azithromycin 250 MG tablet Commonly known as: ZITHROMAX Take 1  tablet (250 mg total) by mouth daily for 2 days.   Chantix Starting Month Pak 0.5 MG X 11 & 1 MG X 42 tablet Generic drug: varenicline Take 0.5 mg by mouth See admin instructions. Take 0.5 mg  Once daily  For 3 days , then increase to one 0.5 mg  Tab twice a day   clopidogrel 75 MG tablet Commonly known as: PLAVIX Take 1 tablet (75 mg total) by mouth daily.   docusate sodium 100 MG capsule Commonly known as: COLACE Take 1 capsule (100 mg total) by mouth 2 (two) times daily.   esomeprazole 40 MG capsule Commonly known as: NEXIUM Take 40 mg by mouth 2 (two) times daily.   fluticasone 50 MCG/ACT nasal spray Commonly known as: FLONASE Place 2 sprays into both nostrils daily as needed for allergies or rhinitis.   Fluticasone-Salmeterol 500-50 MCG/DOSE Aepb Commonly known as: ADVAIR Inhale 1 puff into the lungs 2 (two) times daily.   gabapentin 600 MG tablet Commonly known as: NEURONTIN Take 1,200 mg by mouth 3 (three) times daily.   guaiFENesin-dextromethorphan 100-10 MG/5ML syrup Commonly known as: ROBITUSSIN DM Take 5 mLs by mouth every 4 (four) hours as needed for cough.   HYDROcodone-acetaminophen 7.5-325 MG tablet Commonly known as: NORCO Take 1 tablet by mouth 2 (two) times daily as needed (breakthrough pain).   ipratropium-albuterol 0.5-2.5 (3) MG/3ML Soln Commonly known as: DUONEB Inhale 3 mLs into the lungs every 6 (six) hours as needed (wheezing , Shortness of breath).  Linzess 72 MCG capsule Generic drug: linaclotide Take 72 mcg by mouth daily as needed for constipation.   metoprolol tartrate 50 MG tablet Commonly known as: LOPRESSOR Take 1 tablet (50 mg total) by mouth 2 (two) times daily.   mirtazapine 7.5 MG tablet Commonly known as: REMERON Take 1 tablet (7.5 mg total) by mouth at bedtime. What changed: how much to take   Muscle Rub 10-15 % Crea Apply 1 application topically 4 (four) times daily. To knees   nicotine 14 mg/24hr patch Commonly known  as: NICODERM CQ - dosed in mg/24 hours Place 14 mg onto the skin daily. Needs during admission   nitroGLYCERIN 0.4 MG SL tablet Commonly known as: NITROSTAT Place 0.4 mg under the tongue every 5 (five) minutes as needed for chest pain.   olopatadine 0.1 % ophthalmic solution Commonly known as: PATANOL Place 1 drop into both eyes 2 (two) times daily.   ondansetron 8 MG tablet Commonly known as: ZOFRAN Take 8 mg by mouth as needed for nausea or vomiting.   oxyCODONE 10 mg 12 hr tablet Commonly known as: OXYCONTIN Take 1 tablet (10 mg total) by mouth every 12 (twelve) hours.   OxyCONTIN 20 mg 12 hr tablet Generic drug: oxyCODONE Take 20 mg by mouth 3 (three) times daily.   predniSONE 20 MG tablet Commonly known as: DELTASONE Take 2 tablets (40 mg total) by mouth daily with breakfast for 2 days.   rosuvastatin 20 MG tablet Commonly known as: CRESTOR Take 20 mg by mouth at bedtime.   Singulair 10 MG tablet Generic drug: montelukast Take 10 mg by mouth daily.   tiotropium 18 MCG inhalation capsule Commonly known as: SPIRIVA Place 18 mcg into inhaler and inhale daily.   topiramate 100 MG tablet Commonly known as: TOPAMAX Take 100 mg by mouth 2 (two) times daily.   triamcinolone cream 0.5 % Commonly known as: KENALOG Apply 1 application topically daily as needed (rash).   Vitamins/Minerals Tabs Take 1 tablet by mouth daily.       Disposition and follow-up:   Ms.Kathey BEAUTIFUL PENSYL was discharged from [facility] in Stable condition.  At the hospital follow up visit please address:  1.  Acute on chronic hypoxic respiratory failure 2/2 COPD exacerbation:  - Patient discharged with azithromycin and prednisone for 2 days to complete 5 day course of treatment  - Patient to continue Advair, Spiriva, Singulair, and home duonebs and albuterol prn on discharge - Patient to f/u with PCP  Hypertensive urgency: - Carvedilol discontinued on discharge in setting  of severe COPD. Patient discharged on amlodipine 10mg  daily and metoprolol 50mg  bid   Incidental RLL and LUL lung nodules noted on CTA Chest:  - Recommend re-imaging in 3-6 months 2.  Labs / imaging needed at time of follow-up: bmp  3.  Pending labs/ test needing follow-up: none   Follow-up Appointments:  Follow-up Information    Sharmon Leyden, MD. Schedule an appointment as soon as possible for a visit in 1 week(s).   Specialty: Pediatrics Contact information: [address] [place] [address] [place] [facility] Course by problem list: 1. Acute on chronic respiratory failure 2/2 COPD exacerbation Patient presented with acute dyspnea and worsening productive cough, consistent with COPD exacerbation. She has a history of COPD stage III with most recent FEV1 38.9% of predicted in 2018. She was treated with scheduled duonebs, azithromycin and prednisone  with significant improvement in symptoms. Initially noted to have elevated oxygen requirements to 3L via [place] that improved to 1.5L baseline on discharge. Patient was able to ambulate without significant dyspnea on exertion and improved lung exam on discharge. Patient discharged with azithromycin and prednisone for 2 more days to continue 5 day recommended course of treatment. She is to continue her home medications of Advair, Singulair, Spiriva and home duonebs and albuterol prn. She would benefit from repeat LFTs once acute illness resolved.  2. Hypertensive urgency Patient presented with SBP in 230s on admission, requiring nitro drip. There are no signs of end organ damage. Patient's blood pressure lowered by 25% in first 24 hours with goal SBP of 170-180 after which nitro drip was discontinued. Patient resumed on her home amlodipine dose. She was also started on lisinopril 10mg  daily given that her carvedilol may worsen the COPD exacerbation. Patient discharged on amlodipine 10mg  daily and metoprolol 50mg  twice  daily as it is more beta-selective.   3. Incidental CTA Chest finding: CTA Chest noted a 37mm nodule in lateral basal segment of right lower lobe and 1mm nodule in left upper lobe. Patient appears cachectic and chronically ill appearing on exam. She does have significant smoking history with smoking since age of 67. She states heaviest use, she smoked 1ppd and currently smokes 1 pack of cigarettes per week. Patient would benefit from repeat CT chest in 3-6 months for follow up on these nodules.   Discharge Vitals:   BP (!) 170/87 (BP Location: Right Arm)   Pulse 74   Temp 98.3 F (36.8 C)   Resp 16   Ht 5\' 4"  (1.626 m)   Wt 41.9 kg   SpO2 98%   BMI 15.86 kg/m   Pertinent Labs, Studies, and Procedures:  CBC Latest Ref Rng & Units 04/10/2020 04/09/2020 04/08/2020  WBC 4.0 - 10.5 K/uL 7.3 5.4 -  Hemoglobin 12.0 - 15.0 g/dL 04/12/2020 04/11/2020  Hematocrit 36 - 46 % 36.5 40.6 40.0  Platelets 150 - 400 K/uL 288 340 -   BMP Latest Ref Rng & Units 04/10/2020 04/09/2020 04/08/2020  Glucose 70 - 99 mg/dL 78.9) 04/12/2020) -  BUN 8 - 23 mg/dL 8 9 -  Creatinine 04/11/2020 - 1.00 mg/dL 04/10/2020 381(O -  Sodium 175(Z - 145 mmol/L 133(L) 138 139  Potassium 3.5 - 5.1 mmol/L 3.2(L) 3.5 3.2(L)  Chloride 98 - 111 mmol/L 98 104 -  CO2 22 - 32 mmol/L 24 22 -  Calcium 8.9 - 10.3 mg/dL 8.9 9.1 -   CXR 0.25: IMPRESSION: No acute disease. Hiatal hernia.   CT ANGIO CHEST 04/08/2020: IMPRESSION: 1. No evidence of acute pulmonary artery filling defects to suggest pulmonary embolism. 2. Mild basilar airways thickening and some scattered secretions with some faint peribronchovascular opacity which could reflect sequela of aspiration. 3. 9 mm nodule in the lateral basal segment right lower lobe possibly related to the acute process though overall indeterminate. Additional 4 mm nodule in the left upper lobe as well. Non-contrast chest CT at 3-6 months is recommended. If the nodules are stable at time of repeat CT, then  future CT at 18-24 months (from today's scan) is considered optional for low-risk patients, but is recommended for high-risk patients. This recommendation follows the consensus statement: Guidelines for Management of Incidental Pulmonary Nodules Detected on CT Images: From the Fleischner Society 2017; Radiology 2017; 284:228-243. 4. Additional remote granulomatous disease in the left lung apex and left lung apex. 5. Recurrent moderate hiatal  hernia with CT features of prior fundoplication. 6. Chronic appearing T12 compression deformity with vertebroplasty changes in approximately 50% height loss anteriorly as well as 6 mm retropulsion of the superior endplate fracture fragment with moderate resulting canal stenosis. 7. More age indeterminate inferior endplate compression deformity at L1 with approximately 50% height loss centrally. Could correlate for point tenderness. 8. Aortic Atherosclerosis (ICD10-I70.0) 9. Emphysema (ICD10-J43.9).  Discharge Instructions: Discharge Instructions    Call MD for:  difficulty breathing, headache or visual disturbances   Complete by: As directed    Call MD for:  extreme fatigue   Complete by: As directed    Call MD for:  hives   Complete by: As directed    Call MD for:  persistant dizziness or light-headedness   Complete by: As directed    Call MD for:  persistant nausea and vomiting   Complete by: As directed    Call MD for:  severe uncontrolled pain   Complete by: As directed    Call MD for:  temperature >100.4   Complete by: As directed    Diet - low sodium heart healthy   Complete by: As directed    Discharge instructions   Complete by: As directed    Mrs. Morissa, [place]  You were admitted to the hospital with a COPD exacerbation. You were treated with antibiotics, steroids and duonebs with improvement in your symptoms. You were also noted to have severely elevated blood pressure which improved with IV and oral medications. On discharge: For  your COPD - Please continue to take azithromycin 250mg  daily and prednisone 40mg  daily for 2 more days to complete a 5 day course of antibiotics and steroids. Please use your inhalers as directed and use duonebs as needed. You may take cough syrup as needed. Please follow up with your PCP in 1 week.  For your hypertension - Please continue to take amlodipine 10mg  daily on discharge. We are changing your Carvedilol 12.5mg  twice daily to Metoprolol 50mg  twice daily. Please follow up with your PCP in 1 week.  Thank you!   Increase activity slowly   Complete by: As directed    No wound care   Complete by: As directed       Signed: , MD  04/11/2020, 3:36 PM   Pager: [phone]

## 2020-04-10 NOTE — Evaluation (Addendum)
Physical Therapy Evaluation Patient Details Name: Pam Johnson MRN: [id number] DOB: [birth year] Today's Date: 04/10/2020   History of Present Illness  Pt is a 69 y/o female admitted secondary to SOB and found to have a COPD exacerbation. PMH including but not limited to COPD, HTN and prior CVA.    Clinical Impression  Pt presented supine in bed with HOB elevated, awake and willing to participate in therapy session. Prior to admission, pt reported that she ambulated with use of a rollator (with a L wrist brace donned) and was independent with ADLs. Pt lives with her husband in a one level home with one small threshold step to enter. At the time of evaluation, pt limited secondary to fatigue and increased WOB with activity. Pt on 3L of O2 throughout with SpO2 maintaining >95% throughout. She required min A for bed mobility, min-mod A for transfers and min-mod A to take a few side steps at EOB. Based on pt's level of support at home and her current functional mobility status, would recommend pt d/c home with HHPT and if possible [facility] with [facility] if she qualifies.   Pt would continue to benefit from skilled physical therapy services at this time while admitted and after d/c to address the below listed limitations in order to improve overall safety and independence with functional mobility.     Follow Up Recommendations Home health PT;Supervision/Assistance - 24 hour;Other (comment) (If she qualifies - [facility])    Equipment Recommendations  None recommended by PT    Recommendations for Other Services       Precautions / Restrictions Precautions Precautions: Fall Precaution Comments: monitor SpO2 Restrictions Weight Bearing Restrictions: No      Mobility  Bed Mobility Overal bed mobility: Needs Assistance Bed Mobility: Supine to Sit;Sit to Supine     Supine to sit: Min assist Sit to supine: Min assist   General bed mobility comments: assistance needed to scoot  forwards at EOB and to return L LE onto bed  Transfers Overall transfer level: Needs assistance Equipment used: Rolling walker (2 wheeled);1 person hand held assist Transfers: Sit to/from Stand Sit to Stand: Min assist;Mod assist         General transfer comment: initially used RW as pt reporting she uses one at home; however, then she stated that she usually wears a wrist brace which helps position her L UE to use with RW. Did not have her brace present and unable to position her L UE on RW at this time. Then performed sit<>stand from EOB with 1HHA, requiring mod A to power into standing and for balance as pt with posterior lean initially  Ambulation/Gait Ambulation/Gait assistance: Min assist;Mod assist   Assistive device: 1 person hand held assist       General Gait Details: pt only able to take a few side steps at EOB towards her R side with min-mod A for stability. Pt with very poor coordination of L LE and limited secondary to SOB.  Stairs            Wheelchair Mobility    Modified Rankin (Stroke Patients Only)       Balance Overall balance assessment: Needs assistance Sitting-balance support: Feet supported Sitting balance-Leahy Scale: Fair     Standing balance support: During functional activity;Single extremity supported Standing balance-Leahy Scale: Poor  Pertinent Vitals/Pain Pain Assessment: Faces Faces Pain Scale: Hurts little more Pain Location: headache Pain Descriptors / Indicators: Headache Pain Intervention(s): Monitored during session;Repositioned;Patient requesting pain meds-RN notified    Home Living Family/patient expects to be discharged to:: Private residence Living Arrangements: Spouse/significant other Available Help at Discharge: Family;Available 24 hours/day Type of Home: House Home Access: Stairs to enter   [employer] of Steps: 1 Home Layout: One level Home Equipment:  Emergency planning/management officer - 4 wheels;Wheelchair - manual Additional Comments: at baseline on 1.5L    Prior Function Level of Independence: Independent with assistive device(s)         Comments: ambulates with a rollator most of the time     Hand Dominance        Extremity/Trunk Assessment   Upper Extremity Assessment Upper Extremity Assessment: LUE deficits/detail LUE Deficits / Details: residual weakness and contractures (elbow flexion, wrist flexion and digit flexion contractures) LUE Coordination: decreased gross motor;decreased fine motor    Lower Extremity Assessment Lower Extremity Assessment: LLE deficits/detail LLE Deficits / Details: residual weakness and poor coordination from prior CVA       Communication   Communication: No difficulties  Cognition Arousal/Alertness: Awake/alert Behavior During Therapy: WFL for tasks assessed/performed Overall Cognitive Status: No family/caregiver present to determine baseline cognitive functioning Area of Impairment: Safety/judgement;Problem solving                         Safety/Judgement: Decreased awareness of deficits;Decreased awareness of safety   Problem Solving: Difficulty sequencing;Requires verbal cues        General Comments      Exercises     Assessment/Plan    PT Assessment Patient needs continued PT services  PT Problem List Decreased strength;Decreased range of motion;Decreased activity tolerance;Decreased balance;Decreased mobility;Decreased coordination;Decreased cognition;Decreased safety awareness;Decreased knowledge of precautions;Cardiopulmonary status limiting activity;Pain       PT Treatment Interventions DME instruction;Gait training;Stair training;Functional mobility training;Therapeutic activities;Therapeutic exercise;Balance training;Cognitive remediation;Neuromuscular re-education;Patient/family education    PT Goals (Current goals can be found in the Care Plan section)  Acute  Rehab PT Goals Patient Stated Goal: home soon PT Goal Formulation: With patient/family Time For Goal Achievement: 04/24/20 Potential to Achieve Goals: Good    Frequency Min 3X/week   Barriers to discharge        Co-evaluation               AM-PAC PT "6 Clicks" Mobility  Outcome Measure Help needed turning from your back to your side while in a flat bed without using bedrails?: None Help needed moving from lying on your back to sitting on the side of a flat bed without using bedrails?: A Little Help needed moving to and from a bed to a chair (including a wheelchair)?: A Lot Help needed standing up from a chair using your arms (e.g., wheelchair or bedside chair)?: A Lot Help needed to walk in hospital room?: A Lot Help needed climbing 3-5 steps with a railing? : Total 6 Click Score: 14    End of Session Equipment Utilized During Treatment: Gait belt Activity Tolerance: Patient limited by fatigue Patient left: in bed;with call bell/phone within reach;with bed alarm set Nurse Communication: Mobility status;Patient requests pain meds PT Visit Diagnosis: Other abnormalities of gait and mobility (R26.89)    Time: 6269-4854 PT Time Calculation (min) (ACUTE ONLY): 29 min   Charges:   PT Evaluation $PT Eval Moderate Complexity: 1 Mod PT Treatments $Therapeutic Activity: 8-22 mins  Arletta Bale, DPT  Acute Rehabilitation Services Pager [phone] Office [phone]    Alessandra Bevels Vercie Pokorny 04/10/2020, 9:58 AM

## 2022-12-22 ENCOUNTER — Encounter: Payer: Self-pay | Admitting: Occupational Therapy

## 2022-12-22 ENCOUNTER — Ambulatory Visit: Payer: Medicare Other | Attending: *Deleted | Admitting: Occupational Therapy

## 2022-12-22 DIAGNOSIS — R41842 Visuospatial deficit: Secondary | ICD-10-CM | POA: Diagnosis present

## 2022-12-22 DIAGNOSIS — G8110 Spastic hemiplegia affecting unspecified side: Secondary | ICD-10-CM | POA: Diagnosis present

## 2022-12-22 NOTE — Therapy (Signed)
OUTPATIENT OCCUPATIONAL THERAPY NEURO EVALUATION  Patient Name: Pam Johnson MRN: [id number] DOB:Apr 23, 1951, 72 y.o., female Today's Date: 12/22/2022   REFERRING PROVIDER: Sharlene Dory, MD  END OF SESSION:  OT End of Session - 12/22/22 1536     Visit Number 1    Number of Visits 7    Date for OT Re-Evaluation 02/11/23    Authorization Type Medicare    Progress Note Due on Visit 7    OT Start Time 1538    OT Stop Time 1618    OT Time Calculation (min) 40 min    Activity Tolerance Patient tolerated treatment well    Behavior During Therapy WFL for tasks assessed/performed             Past Medical History:  Diagnosis Date   Acid reflux    COPD (chronic obstructive pulmonary disease) ([place])    Hypertension    Sacral fracture ([place])    Spinal stenosis    Past Surgical History:  Procedure Laterality Date   ABDOMINAL HYSTERECTOMY     CHOLECYSTECTOMY     HERNIA REPAIR     IR ANGIO INTRA EXTRACRAN SEL COM CAROTID INNOMINATE UNI L MOD SED  03/22/2018   IR ANGIO VERTEBRAL SEL SUBCLAVIAN INNOMINATE UNI R MOD SED  03/22/2018   IR CT HEAD LTD  03/22/2018   IR PERCUTANEOUS ART THROMBECTOMY/INFUSION INTRACRANIAL INC DIAG ANGIO  03/22/2018   LEFT HEART CATH AND CORONARY ANGIOGRAPHY  2000   Non-obstructive CAD   NISSEN FUNDOPLICATION     RADIOLOGY WITH ANESTHESIA N/A 03/22/2018   Procedure: IR WITH ANESTHESIA;  Surgeon: Luanne Bras, MD;  Location: [facility];  Service: Radiology;  Laterality: N/A;   TOTAL HIP ARTHROPLASTY Right 11/2017   Patient Active Problem List   Diagnosis Date Noted   Pressure injury of skin 04/09/2020   BMI less than 19,adult 04/09/2020   Acute on chronic respiratory failure with hypoxia ([place]) 04/08/2020   Acute on chronic respiratory failure (HCC)    COPD exacerbation ([place]) 07/18/2018   Homonymous hemianopia, left 07/12/2018   Closed fracture of multiple ribs of left side 05/24/2018   Chronic pain disorder 04/10/2018   Malnutrition of moderate  degree 04/07/2018   Protein-calorie malnutrition, severe ([place]) 04/06/2018   Altered mental status    Left-sided weakness 04/05/2018   Hyponatremia 04/05/2018   Dysphagia, post-stroke    Hypertensive urgency    Acute blood loss anemia    Chronic obstructive pulmonary disease ([place])    Acute ischemic right MCA stroke ([place]) 03/24/2018   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[facility])    Essential hypertension    Stroke (cerebrum) ([place]) 03/22/2018   Carotid artery occlusion without infarction 03/22/2018   Pneumonia 10/18/2015   Coccydynia 11/01/2013   Depression 05/02/2013   Pulmonary nodule seen on imaging study 12/04/2012   Obstructive chronic bronchitis 11/02/2012   Tobacco use disorder 11/02/2012   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 Was able to get fingers open until the last 4-5 months.  Pt accompanied by: significant other -  HW  PERTINENT HISTORY: "Pam Johnson is a 72 y.o. female who presents for evaluation of stroke rehabilitation needs. She sustained a Right MCA ischemic stroke in May 2019 with recurrence vs.recrudescence in June 2019.Since last visit, patient reports hospitalization in November 2023 due to COPD exacerbation. She is doing well know and denies recent medical changes. She states botulinum toxin injection lasted about 2.5 months. She would like to receive repeat injections today. Husband states she was unable to get appointment for OT to start  exercises and stretching. Patient requesting new referral.  PLAN:   Spasticity management: Patient received botulinumtoxin injections today for focal post-stroke limb spasticity. Performed as listed in the procedure note below. Therapy: Sent new referral to occupational therapy at [facility] for LUE stretching techniques.  Bracing: She will benefit from left resting hand splint after the arm relaxes at follow up. Discussed carrot hand orthosis during visit today.  Biceps brachii 100 units (two sites) Brachialis 50 units Brachioradialis 30 units Pronator teres 20 units FCU 50 units FCR 50 units FDS 50 units Dorsal interossei 50 units  Right MCA ischemic stroke in May 2019 with recurrence vs.recrudescence in June 2019"  PRECAUTIONS: Fall  WEIGHT BEARING RESTRICTIONS: No  PAIN:  Are you having pain? No  FALLS: Has patient fallen in last 6 months? No  LIVING ENVIRONMENT: Lives with: lives with their spouse Lives in: House/apartment Stairs: Yes: Internal: 12 steps; on right going up and External: 2 steps; none; pt able to live on main floor Has following equipment at home: Single point cane, Walker - 4 wheeled, Wheelchair (manual), Shower bench, and bed side commode  PLOF:  Pt has been at current level of function for the last 4-5 months and at similar level since 2019  PATIENT GOALS: To use LUE to write again  OBJECTIVE:   HAND DOMINANCE:  Left handed but writes with right hand after the strokes in 2019  ADLs: Overall ADLs: max A  IADLs: Mostly Max A to dependent  Handwriting: 50% legible  MOBILITY STATUS: Needs Assist: maxA   FUNCTIONAL OUTCOME MEASURES:    UPPER EXTREMITY ROM:    RUE: WNL; LUE: lacks AROM, maceration of L palm; unable to achieve [facility] PROM due to poor tolerance  UPPER EXTREMITY MMT:     RUE: WFL;  LUE: BFL given lack of AROM  SENSATION: Paresthesias reported in L  EDEMA: none observed or reported  MUSCLE TONE: LUE:  Severe  COGNITION: Overall cognitive status: Within functional limits for tasks assessed  VISION: Subjective report: Wears bifocals; cannot turn head fully to left to see and has fallen in the past trying to turn all the way to this side; pt starts writing in the middle of the page due to L visual field cut  Baseline vision: Bifocals and Wears glasses all the time Visual history:  cataract removal both eyes  VISION ASSESSMENT: Impaired Visual Fields: Left homonymous hemianopsia   PERCEPTION: Impaired: depth perception  PRAXIS: WFL  OBSERVATIONS: Pt arrives in rollator / w/c propelled by husband. Poor L hand hygiene with hand in claw fist. Pt is on 2L O2 with nasal canula.   TODAY'S TREATMENT:  N/a this visit  PATIENT EDUCATION: Education details: OT role and POC; reasonable expectations with therapy to include spasticity management but no improvement in AROM given chronicity of stroke.  Person educated: Patient and Spouse Education method: Explanation Education comprehension: verbalized understanding and needs further education  HOME EXERCISE PROGRAM: N/a this visit   GOALS:  LONG TERM GOALS: Target date: 02/11/2023  Patient will demonstrate LUE HEP with assistance from spouse with 25% verbal cues or less for proper execution. Baseline: not yet initiated Goal status: INITIAL  2.  Patient and caregiver will independently recall at least 2 compensatory strategies for visual impairment without cueing.  Baseline:  Goal status: INITIAL  3.  Patient will report at least two-point increase in average PSFS score or at least three-point increase in a single activity score indicating functionally significant improvement given minimum detectable change.  Baseline: 3.3 (see above for more details) Goal status: INITIAL   ASSESSMENT:  CLINICAL  IMPRESSION: Patient is a 72 y.o. female who was seen today for occupational therapy evaluation for L spasticity management. Hx includes COPD, R MCA CVA, parietal lobe infarction, HTN, depression, AMS, hyponatremia, L rib fx, L homonymous hemianopia, tobacco use, pressure injury, and respiratory failure. Patient currently presents near baseline level of functioning; however, has noticed increasing impairments with respect to LUE ROM and continued visual impairments. Pt is good candidate for skilled OT services in the outpatient setting following Botox injections of LUE in effort to improve hygiene and joint integrity. Rehab potential limited due to chronicity of stroke. Please see functional deficits and impairments as noted below.   PERFORMANCE DEFICITS: in functional skills including ADLs, IADLs, coordination, sensation, tone, ROM, strength, pain, flexibility, Fine motor control, Gross motor control, skin integrity, vision, and UE functional use, cognitive skills including  IMPAIRMENTS: are limiting patient from ADLs and IADLs.   CO-MORBIDITIES: has co-morbidities such as COPD  that affects occupational performance. Patient will benefit from skilled OT to address above impairments and improve overall function.  MODIFICATION OR ASSISTANCE TO COMPLETE EVALUATION: Min-Moderate modification of tasks or assist with assess necessary to complete an evaluation.  OT OCCUPATIONAL PROFILE AND HISTORY: Problem focused assessment: Including review of records relating to presenting problem.  CLINICAL DECISION MAKING: LOW - limited treatment options, no task modification necessary  REHAB POTENTIAL: Fair given chronicity of stroke  EVALUATION COMPLEXITY: Low    PLAN:  OT FREQUENCY: 1x/week  OT DURATION: 6 weeks  PLANNED INTERVENTIONS: self care/ADL training, therapeutic exercise, therapeutic activity, manual therapy, passive range of motion, functional mobility training, splinting, electrical  stimulation, moist heat, patient/family education, visual/perceptual remediation/compensation, and Re-evaluation  RECOMMENDED OTHER SERVICES: none at this time  CONSULTED AND AGREED WITH PLAN OF CARE: Patient and family Furniture conservator/restorer FOR NEXT SESSION: LUE ROM HEP   Dennis Bast, OT 12/22/2022, 5:15 PM

## 2023-01-06 ENCOUNTER — Ambulatory Visit: Payer: Medicare Other | Attending: *Deleted | Admitting: Occupational Therapy

## 2023-01-06 ENCOUNTER — Encounter: Payer: Self-pay | Admitting: Occupational Therapy

## 2023-01-06 DIAGNOSIS — G8110 Spastic hemiplegia affecting unspecified side: Secondary | ICD-10-CM | POA: Insufficient documentation

## 2023-01-06 DIAGNOSIS — R41842 Visuospatial deficit: Secondary | ICD-10-CM | POA: Insufficient documentation

## 2023-01-06 NOTE — Therapy (Signed)
OUTPATIENT OCCUPATIONAL THERAPY NEURO EVALUATION  Patient Name: Pam Johnson MRN: [id number] DOB:[birth year], 72 y.o., female Today's Date: 01/06/2023   REFERRING PROVIDER: Sharlene Dory, MD  END OF SESSION:  OT End of Session - 01/06/23 1706     Visit Number 2    Number of Visits 7    Date for OT Re-Evaluation 02/11/23    Authorization Type Medicare    Progress Note Due on Visit 7    OT Start Time 1530    OT Stop Time 1615    OT Time Calculation (min) 45 min    Activity Tolerance Patient tolerated treatment well    Behavior During Therapy Flat affect             Past Medical History:  Diagnosis Date   Acid reflux    COPD (chronic obstructive pulmonary disease) ([place])    Hypertension    Sacral fracture ([place])    Spinal stenosis    Past Surgical History:  Procedure Laterality Date   ABDOMINAL HYSTERECTOMY     CHOLECYSTECTOMY     HERNIA REPAIR     IR ANGIO INTRA EXTRACRAN SEL COM CAROTID INNOMINATE UNI L MOD SED  03/22/2018   IR ANGIO VERTEBRAL SEL SUBCLAVIAN INNOMINATE UNI R MOD SED  03/22/2018   IR CT HEAD LTD  03/22/2018   IR PERCUTANEOUS ART THROMBECTOMY/INFUSION INTRACRANIAL INC DIAG ANGIO  03/22/2018   LEFT HEART CATH AND CORONARY ANGIOGRAPHY  2000   Non-obstructive CAD   NISSEN FUNDOPLICATION     RADIOLOGY WITH ANESTHESIA N/A 03/22/2018   Procedure: IR WITH ANESTHESIA;  Surgeon: Luanne Bras, MD;  Location: [facility];  Service: Radiology;  Laterality: N/A;   TOTAL HIP ARTHROPLASTY Right 11/2017   Patient Active Problem List   Diagnosis Date Noted   Pressure injury of skin 04/09/2020   BMI less than 19,adult 04/09/2020   Acute on chronic respiratory failure with hypoxia ([place]) 04/08/2020   Acute on chronic respiratory failure (HCC)    COPD exacerbation ([place]) 07/18/2018   Homonymous hemianopia, left 07/12/2018   Closed fracture of multiple ribs of left side 05/24/2018   Chronic pain disorder 04/10/2018   Malnutrition of moderate degree 04/07/2018    Protein-calorie malnutrition, severe ([place]) 04/06/2018   Altered mental status    Left-sided weakness 04/05/2018   Hyponatremia 04/05/2018   Dysphagia, post-stroke    Hypertensive urgency    Acute blood loss anemia    Chronic obstructive pulmonary disease ([place])    Acute ischemic right MCA stroke ([place]) 03/24/2018   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[facility])    Essential hypertension    Stroke (cerebrum) ([place]) 03/22/2018   Carotid artery occlusion without infarction 03/22/2018   Pneumonia 10/18/2015   Coccydynia 11/01/2013   Depression 05/02/2013   Pulmonary nodule seen on imaging study 12/04/2012   Obstructive chronic bronchitis 11/02/2012   Tobacco use disorder 11/02/2012   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 Was able to get fingers open until the last 4-5 months.  Pt accompanied by: significant other - HW  PERTINENT HISTORY: "Pam Johnson is a 72 y.o. female who presents for evaluation of stroke rehabilitation needs. She sustained a Right MCA ischemic stroke in May 2019 with recurrence vs.recrudescence in June 2019.Since last visit, patient reports hospitalization in November 2023 due to COPD exacerbation. She is doing well know and denies recent medical changes. She states botulinum toxin injection lasted about 2.5 months. She would like to receive repeat injections today. Husband states she was unable to get appointment for OT to start exercises and stretching.  Patient requesting new referral.  PLAN:   Spasticity management: Patient received botulinumtoxin injections today for focal post-stroke limb spasticity. Performed as listed in the procedure note below. Therapy: Sent new referral to occupational therapy at [facility] for LUE stretching techniques.  Bracing: She will benefit from left resting hand splint after the arm relaxes at follow up. Discussed carrot hand orthosis during visit today.  Biceps brachii 100 units (two sites) Brachialis 50 units Brachioradialis 30 units Pronator teres 20 units FCU 50 units FCR 50 units FDS 50 units Dorsal interossei 50 units  Right MCA ischemic stroke in May 2019 with recurrence vs.recrudescence in June 2019"  PRECAUTIONS: Fall  WEIGHT BEARING RESTRICTIONS: No  PAIN:  Are you having pain? No  FALLS: Has patient fallen in last 6 months? No  LIVING ENVIRONMENT: Lives with: lives with their spouse Lives in: House/apartment Stairs: Yes: Internal: 12 steps; on right going up and External: 2 steps; none; pt able to live on main floor Has following equipment at home: Single point cane, Walker - 4 wheeled, Wheelchair (manual), Shower bench, and bed side commode  PLOF:  Pt has been at current level of function for the last 4-5 months and at similar level since 2019  PATIENT GOALS: To use LUE to write again  OBJECTIVE:   HAND DOMINANCE:  Left handed but writes with right hand after the strokes in 2019  ADLs: Overall ADLs: max A  IADLs: Mostly Max A to dependent  Handwriting: 50% legible  MOBILITY STATUS: Needs Assist: maxA   FUNCTIONAL OUTCOME MEASURES:    UPPER EXTREMITY ROM:    RUE: WNL; LUE: lacks AROM, maceration of L palm; unable to achieve [facility] PROM due to poor tolerance  UPPER EXTREMITY MMT:     RUE: WFL;  LUE: BFL given lack of AROM  SENSATION: Paresthesias reported in L  EDEMA: none observed or reported  MUSCLE TONE: LUE: Severe  COGNITION: Overall cognitive  status: Within functional limits for tasks assessed  VISION: Subjective report: Wears bifocals; cannot turn head fully to left to see and has fallen in the past trying to turn all the way to this side; pt starts writing in the middle of the page due to L visual field cut  Baseline vision: Bifocals and Wears glasses all the time Visual history:  cataract removal both eyes  VISION ASSESSMENT: Impaired Visual Fields: Left homonymous hemianopsia   PERCEPTION: Impaired: depth perception  PRAXIS: WFL  OBSERVATIONS: Pt arrives in rollator / w/c propelled by husband. Poor L hand hygiene with hand in claw fist. Pt is on 2L O2 with nasal canula.   TODAY'S TREATMENT:  3/14:  Reviewed LTG's with patient who is in agreement.  Husband is hopeful she will gain sufficient range of motion in hand to be able to tolerate a wrist hand orthosis.  Patient's hand cleaned thoroughly and dried.  Patient with extremely long nails in left hand.  5th digit nail significantly torn nail ripped off during session.  No discomfort - patient not aware - until told and she was relieved stating - they all need to come off - have not been able to get nails done in awhile.  Patient fitted with therapy carrot which they recently purchased.  Demonstrated to patient - she was unable to place carrot successfully in left hand with right hand due to decreased sensation and decreased vision.  Patient's husband able to place carrot in hand - and both able to recall level 4 position.  Unable to place larger circumference as thumb base due to hand position.  Although able to get positive stretch (sufficient for nail care) in inverted position.    PATIENT EDUCATION: Education details: OT role and POC; reasonable expectations with therapy to include spasticity management but no improvement in AROM given chronicity of  stroke.  Person educated: Patient and Spouse Education method: Explanation Education comprehension: verbalized understanding and needs further education  HOME EXERCISE PROGRAM: N/a this visit   GOALS:  LONG TERM GOALS: Target date: 02/11/2023  Patient will demonstrate LUE HEP with assistance from spouse with 25% verbal cues or less for proper execution. Baseline: not yet initiated Goal status: IN PROGRESS  2.  Patient and caregiver will independently recall at least 2 compensatory strategies for visual impairment without cueing.  Baseline:  Goal status: IN PROGRESS  3.  Patient will report at least two-point increase in average PSFS score or at least three-point increase in a single activity score indicating functionally significant improvement given minimum detectable change.  Baseline: 3.3 (see above for more details) Goal status: IN PROGRESS   ASSESSMENT:  CLINICAL IMPRESSION: Patient is a 72 y.o. female who was seen today for occupational therapy evaluation for L spasticity management. Hx includes COPD, R MCA CVA, parietal lobe infarction, HTN, depression, AMS, hyponatremia, L rib fx, L homonymous hemianopia, tobacco use, pressure injury, and respiratory failure. Patient currently presents near baseline level of functioning; however, has noticed increasing impairments with respect to LUE ROM and continued visual impairments. Pt is good candidate for skilled OT services in the outpatient setting following Botox injections of LUE in effort to improve hygiene and joint integrity. Rehab potential limited due to chronicity of stroke. Please see functional deficits and impairments as noted below.   PERFORMANCE DEFICITS: in functional skills including ADLs, IADLs, coordination, sensation, tone, ROM, strength, pain, flexibility, Fine motor control, Gross motor control, skin integrity, vision, and UE functional use, cognitive skills including  IMPAIRMENTS: are limiting patient from ADLs  and IADLs.   CO-MORBIDITIES: has co-morbidities such as COPD  that affects occupational performance. Patient will benefit from skilled OT to address above impairments and improve overall function.  MODIFICATION OR ASSISTANCE TO COMPLETE EVALUATION: Min-Moderate modification of tasks or assist with assess necessary to complete an evaluation.  OT OCCUPATIONAL PROFILE AND HISTORY: Problem focused assessment: Including review of records relating to presenting problem.  CLINICAL DECISION MAKING: LOW - limited treatment options, no task modification necessary  REHAB POTENTIAL: Fair given chronicity of stroke  EVALUATION COMPLEXITY: Low    PLAN:  OT FREQUENCY: 1x/week  OT DURATION: 6 weeks  PLANNED INTERVENTIONS: self care/ADL training, therapeutic exercise, therapeutic  activity, manual therapy, passive range of motion, functional mobility training, splinting, electrical stimulation, moist heat, patient/family education, visual/perceptual remediation/compensation, and Re-evaluation  RECOMMENDED OTHER SERVICES: none at this time  CONSULTED AND AGREED WITH PLAN OF CARE: Patient and family Midwife  PLAN FOR NEXT SESSION: LUE ROM HEP   Mariah Milling, OT 01/06/2023, 5:07 PM

## 2023-01-13 ENCOUNTER — Ambulatory Visit: Payer: Medicare Other | Admitting: Occupational Therapy

## 2023-01-15 ENCOUNTER — Emergency Department (HOSPITAL_COMMUNITY): Payer: Medicare Other

## 2023-01-15 ENCOUNTER — Encounter (HOSPITAL_COMMUNITY): Payer: Self-pay

## 2023-01-15 ENCOUNTER — Emergency Department (HOSPITAL_COMMUNITY): Admit: 2023-01-15 | Payer: Medicare Other | Admitting: Cardiology

## 2023-01-15 ENCOUNTER — Emergency Department (HOSPITAL_COMMUNITY)
Admission: EM | Admit: 2023-01-15 | Discharge: 2023-01-24 | Disposition: E | Payer: Medicare Other | Attending: Emergency Medicine | Admitting: Emergency Medicine

## 2023-01-15 ENCOUNTER — Encounter (HOSPITAL_COMMUNITY): Admission: EM | Disposition: E | Payer: Self-pay | Source: Home / Self Care | Attending: Emergency Medicine

## 2023-01-15 DIAGNOSIS — S81812A Laceration without foreign body, left lower leg, initial encounter: Secondary | ICD-10-CM | POA: Insufficient documentation

## 2023-01-15 DIAGNOSIS — S2242XA Multiple fractures of ribs, left side, initial encounter for closed fracture: Secondary | ICD-10-CM | POA: Diagnosis not present

## 2023-01-15 DIAGNOSIS — J690 Pneumonitis due to inhalation of food and vomit: Secondary | ICD-10-CM

## 2023-01-15 DIAGNOSIS — W19XXXA Unspecified fall, initial encounter: Secondary | ICD-10-CM | POA: Diagnosis not present

## 2023-01-15 DIAGNOSIS — Z7982 Long term (current) use of aspirin: Secondary | ICD-10-CM | POA: Insufficient documentation

## 2023-01-15 DIAGNOSIS — S2241XA Multiple fractures of ribs, right side, initial encounter for closed fracture: Secondary | ICD-10-CM | POA: Insufficient documentation

## 2023-01-15 DIAGNOSIS — X31XXXA Exposure to excessive natural cold, initial encounter: Secondary | ICD-10-CM | POA: Diagnosis not present

## 2023-01-15 DIAGNOSIS — T68XXXA Hypothermia, initial encounter: Secondary | ICD-10-CM

## 2023-01-15 DIAGNOSIS — I2510 Atherosclerotic heart disease of native coronary artery without angina pectoris: Secondary | ICD-10-CM | POA: Diagnosis not present

## 2023-01-15 DIAGNOSIS — S299XXA Unspecified injury of thorax, initial encounter: Secondary | ICD-10-CM | POA: Diagnosis present

## 2023-01-15 DIAGNOSIS — I959 Hypotension, unspecified: Secondary | ICD-10-CM | POA: Diagnosis not present

## 2023-01-15 DIAGNOSIS — Z7951 Long term (current) use of inhaled steroids: Secondary | ICD-10-CM | POA: Diagnosis not present

## 2023-01-15 DIAGNOSIS — I469 Cardiac arrest, cause unspecified: Secondary | ICD-10-CM

## 2023-01-15 DIAGNOSIS — D649 Anemia, unspecified: Secondary | ICD-10-CM

## 2023-01-15 DIAGNOSIS — E871 Hypo-osmolality and hyponatremia: Secondary | ICD-10-CM | POA: Diagnosis not present

## 2023-01-15 DIAGNOSIS — R7401 Elevation of levels of liver transaminase levels: Secondary | ICD-10-CM

## 2023-01-15 DIAGNOSIS — S2243XA Multiple fractures of ribs, bilateral, initial encounter for closed fracture: Secondary | ICD-10-CM

## 2023-01-15 HISTORY — DX: Cerebral infarction, unspecified: I63.9

## 2023-01-15 LAB — COMPREHENSIVE METABOLIC PANEL
ALT: 364 U/L — ABNORMAL HIGH (ref 0–44)
AST: 532 U/L — ABNORMAL HIGH (ref 15–41)
Albumin: 1.7 g/dL — ABNORMAL LOW (ref 3.5–5.0)
Alkaline Phosphatase: 81 U/L (ref 38–126)
Anion gap: 20 — ABNORMAL HIGH (ref 5–15)
BUN: 17 mg/dL (ref 8–23)
CO2: 13 mmol/L — ABNORMAL LOW (ref 22–32)
Calcium: 7.5 mg/dL — ABNORMAL LOW (ref 8.9–10.3)
Chloride: 92 mmol/L — ABNORMAL LOW (ref 98–111)
Creatinine, Ser: 1.42 mg/dL — ABNORMAL HIGH (ref 0.44–1.00)
GFR, Estimated: 40 mL/min — ABNORMAL LOW (ref >60)
Glucose, Bld: 90 mg/dL (ref 70–99)
Potassium: 4.2 mmol/L (ref 3.5–5.1)
Sodium: 125 mmol/L — ABNORMAL LOW (ref 135–145)
Total Bilirubin: 0.7 mg/dL (ref 0.3–1.2)
Total Protein: 3.6 g/dL — ABNORMAL LOW (ref 6.5–8.1)

## 2023-01-15 LAB — I-STAT CHEM 8, ED
BUN: 19 mg/dL (ref 8–23)
Calcium, Ion: 1.04 mmol/L — ABNORMAL LOW (ref 1.15–1.40)
Chloride: 95 mmol/L — ABNORMAL LOW (ref 98–111)
Creatinine, Ser: 1.3 mg/dL — ABNORMAL HIGH (ref 0.44–1.00)
Glucose, Bld: 84 mg/dL (ref 70–99)
HCT: 22 % — ABNORMAL LOW (ref 36.0–46.0)
Hemoglobin: 7.5 g/dL — ABNORMAL LOW (ref 12.0–15.0)
Potassium: 4.2 mmol/L (ref 3.5–5.1)
Sodium: 125 mmol/L — ABNORMAL LOW (ref 135–145)
TCO2: 14 mmol/L — ABNORMAL LOW (ref 22–32)

## 2023-01-15 LAB — CBC
HCT: 25.4 % — ABNORMAL LOW (ref 36.0–46.0)
Hemoglobin: 7.8 g/dL — ABNORMAL LOW (ref 12.0–15.0)
MCH: 29.5 pg (ref 26.0–34.0)
MCHC: 30.7 g/dL (ref 30.0–36.0)
MCV: 96.2 fL (ref 80.0–100.0)
Platelets: 276 10*3/uL (ref 150–400)
RBC: 2.64 MIL/uL — ABNORMAL LOW (ref 3.87–5.11)
RDW: 15.6 % — ABNORMAL HIGH (ref 11.5–15.5)
WBC: 19.3 10*3/uL — ABNORMAL HIGH (ref 4.0–10.5)
nRBC: 0.5 % — ABNORMAL HIGH (ref 0.0–0.2)

## 2023-01-15 LAB — I-STAT ARTERIAL BLOOD GAS, ED
Acid-base deficit: 22 mmol/L — ABNORMAL HIGH (ref 0.0–2.0)
Bicarbonate: 9.6 mmol/L — ABNORMAL LOW (ref 20.0–28.0)
Calcium, Ion: 1.05 mmol/L — ABNORMAL LOW (ref 1.15–1.40)
HCT: 23 % — ABNORMAL LOW (ref 36.0–46.0)
Hemoglobin: 7.8 g/dL — ABNORMAL LOW (ref 12.0–15.0)
O2 Saturation: 100 %
Patient temperature: 86.6
Potassium: 4.1 mmol/L (ref 3.5–5.1)
Sodium: 125 mmol/L — ABNORMAL LOW (ref 135–145)
TCO2: 11 mmol/L — ABNORMAL LOW (ref 22–32)
pCO2 arterial: 35.2 mmHg (ref 32–48)
pH, Arterial: 6.993 — CL (ref 7.35–7.45)
pO2, Arterial: 475 mmHg — ABNORMAL HIGH (ref 83–108)

## 2023-01-15 LAB — URINALYSIS, ROUTINE W REFLEX MICROSCOPIC
Glucose, UA: NEGATIVE mg/dL
Ketones, ur: NEGATIVE mg/dL
Leukocytes,Ua: NEGATIVE
Nitrite: NEGATIVE
Protein, ur: 100 mg/dL — AB
Specific Gravity, Urine: >1.03 — ABNORMAL HIGH (ref 1.005–1.030)
pH: 6 (ref 5.0–8.0)

## 2023-01-15 LAB — URINALYSIS, MICROSCOPIC (REFLEX)

## 2023-01-15 LAB — TROPONIN I (HIGH SENSITIVITY)
Troponin I (High Sensitivity): 366 ng/L (ref <18)
Troponin I (High Sensitivity): 366 ng/L (ref <18)

## 2023-01-15 SURGERY — CORONARY/GRAFT ACUTE MI REVASCULARIZATION
Anesthesia: LOCAL

## 2023-01-15 MED ORDER — SODIUM CHLORIDE 0.9 % IV SOLN
1.0000 g | Freq: Once | INTRAVENOUS | Status: DC
  Filled 2023-01-15: qty 10

## 2023-01-15 MED ORDER — MIDAZOLAM HCL 2 MG/2ML IJ SOLN
2.0000 mg | Freq: Once | INTRAMUSCULAR | Status: AC
  Administered 2023-01-15: 2 mg via INTRAVENOUS
  Filled 2023-01-15: qty 2

## 2023-01-15 MED ORDER — IOHEXOL 350 MG/ML SOLN
75.0000 mL | Freq: Once | INTRAVENOUS | Status: AC | PRN
  Administered 2023-01-15: 75 mL via INTRAVENOUS

## 2023-01-15 MED ORDER — SODIUM CHLORIDE 0.9 % IV BOLUS
1000.0000 mL | Freq: Once | INTRAVENOUS | Status: AC
  Administered 2023-01-15: 1000 mL via INTRAVENOUS

## 2023-01-15 MED ORDER — METRONIDAZOLE 500 MG/100ML IV SOLN
500.0000 mg | Freq: Two times a day (BID) | INTRAVENOUS | Status: DC
  Filled 2023-01-15: qty 100

## 2023-01-15 MED ORDER — EPINEPHRINE HCL 5 MG/250ML IV SOLN IN NS
0.5000 ug/min | INTRAVENOUS | Status: DC
  Administered 2023-01-15: 2 ug/min via INTRAVENOUS

## 2023-01-15 MED ORDER — FENTANYL CITRATE PF 50 MCG/ML IJ SOSY
100.0000 ug | PREFILLED_SYRINGE | Freq: Once | INTRAMUSCULAR | Status: AC
  Administered 2023-01-15: 100 ug via INTRAVENOUS
  Filled 2023-01-15 (×2): qty 2

## 2023-01-15 MED ORDER — NOREPINEPHRINE 4 MG/250ML-% IV SOLN
0.0000 ug/min | INTRAVENOUS | Status: DC
  Administered 2023-01-15: 20 ug/min via INTRAVENOUS
  Filled 2023-01-15: qty 250

## 2023-01-15 MED ORDER — SODIUM CHLORIDE 0.9 % IV SOLN
INTRAVENOUS | Status: AC | PRN
  Administered 2023-01-15: 999 mL via INTRAVENOUS

## 2023-01-20 ENCOUNTER — Ambulatory Visit: Payer: Medicare Other | Admitting: Occupational Therapy

## 2023-01-24 NOTE — Code Documentation (Signed)
Pt placed on our zole pads & pulse was checked, compressions restarted.

## 2023-01-24 NOTE — ED Provider Notes (Signed)
[facility] Provider Note   CSN: [id number] Arrival date & time: 01/24/2023  1839     History  Chief Complaint  Patient presents with   CPR    Pam Johnson is a 72 y.o. female.  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  Pt had a witnessed arrest today around 1815.  EMS was called and CPR was started.  Pt had 10 minutes of down time prior to EMS arrival with no CPR.  Pt was given 6 rounds of epi.  She did have ROSC, but would code again after that.  Pt was orally intubated by EMS with a 7-0 ETT.  Pt pulseless upon arrival to the ED.          Home Medications Prior to Admission medications   Medication Sig Start Date End Date Taking? Authorizing Provider  acetaminophen (TYLENOL) 325 MG tablet Take 1-2 tablets (325-650 mg total) by mouth every 6 (six) hours as needed for mild pain. Patient not taking: Reported on 12/22/2022 03/31/18   Love, Ivan Anchors, PA-C  albuterol (PROVENTIL HFA;VENTOLIN HFA) 108 (90 BASE) MCG/ACT inhaler Inhale 2 puffs into the lungs every 6 (six) hours as needed for wheezing or shortness of breath.    [provider]  amLODipine (NORVASC) 10 MG tablet Take 1 tablet (10 mg total) by mouth daily. Patient not taking: Reported on 12/22/2022 04/10/20   Harvie Heck, MD  aspirin 81 MG EC tablet Take 1 tablet (81 mg total) by mouth daily. 04/01/18   Love, Ivan Anchors, PA-C  CHANTIX STARTING MONTH PAK 0.5 MG X 11 & 1 MG X 42 tablet Take 0.5 mg by mouth See admin instructions. Take 0.5 mg  Once daily  For 3 days , then increase to one 0.5 mg  Tab twice a day Patient not taking: Reported on 04/08/2020 07/13/18   [provider]  clopidogrel (PLAVIX) 75 MG tablet Take 1 tablet (75 mg total) by mouth daily. 04/01/18   Love, Ivan Anchors, PA-C  docusate sodium (COLACE) 100 MG capsule Take 1 capsule (100 mg  total) by mouth 2 (two) times daily. Patient not taking: Reported on 04/08/2020 07/19/18   Jean Rosenthal, MD  esomeprazole (NEXIUM) 40 MG capsule Take 40 mg by mouth 2 (two) times daily. 04/01/20   [provider]  fluticasone (FLONASE) 50 MCG/ACT nasal spray Place 2 sprays into both nostrils daily as needed for allergies or rhinitis.    [provider]  Fluticasone-Salmeterol (ADVAIR) 500-50 MCG/DOSE AEPB Inhale 1 puff into the lungs 2 (two) times daily.    [provider]  gabapentin (NEURONTIN) 600 MG tablet Take 1,200 mg by mouth 3 (three) times daily. 07/05/18   [provider]  guaiFENesin-dextromethorphan (ROBITUSSIN DM) 100-10 MG/5ML syrup Take 5 mLs by mouth every 4 (four) hours as needed for cough. Patient not taking: Reported on 12/22/2022 04/10/20   Harvie Heck, MD  HYDROcodone-acetaminophen (NORCO) 7.5-325 MG tablet Take 1 tablet by mouth 2 (two) times daily as needed (breakthrough pain). 03/31/18   Love, Ivan Anchors, PA-C  ipratropium-albuterol (DUONEB) 0.5-2.5 (3) MG/3ML SOLN Inhale 3 mLs into the lungs every 6 (six) hours as needed (wheezing , Shortness of breath).  06/29/18   [provider]  linaclotide (LINZESS) 72 MCG capsule Take 72 mcg by mouth daily as needed for constipation. 10/11/19  [provider]  Menthol-Methyl Salicylate (MUSCLE RUB) 10-15 % CREA Apply 1 application topically 4 (four) times daily. To knees Patient not taking: Reported on 12/22/2022 03/31/18   Bary Leriche, PA-C  metoprolol tartrate (LOPRESSOR) 50 MG tablet Take 1 tablet (50 mg total) by mouth 2 (two) times daily. 04/10/20 05/10/20  Harvie Heck, MD  mirtazapine (REMERON) 7.5 MG tablet Take 1 tablet (7.5 mg total) by mouth at bedtime. Patient taking differently: Take 30 mg by mouth at bedtime.  04/26/18   Love, Ivan Anchors, PA-C  montelukast (SINGULAIR) 10 MG tablet Take 10 mg by mouth daily.     [provider]  nicotine (NICODERM CQ - DOSED IN MG/24 HOURS) 14  mg/24hr patch Place 14 mg onto the skin daily. Needs during admission 04/01/18   [provider]  nitroGLYCERIN (NITROSTAT) 0.4 MG SL tablet Place 0.4 mg under the tongue every 5 (five) minutes as needed for chest pain.    [provider]  olopatadine (PATANOL) 0.1 % ophthalmic solution Place 1 drop into both eyes 2 (two) times daily.    [provider]  ondansetron (ZOFRAN) 8 MG tablet Take 8 mg by mouth as needed for nausea or vomiting.  07/14/18   [provider]  oxyCODONE (OXYCONTIN) 10 mg 12 hr tablet Take 1 tablet (10 mg total) by mouth every 12 (twelve) hours. Patient not taking: Reported on 04/08/2020 03/31/18   Love, Ivan Anchors, PA-C  OXYCONTIN 20 MG 12 hr tablet Take 20 mg by mouth 3 (three) times daily. 04/03/20   [provider]  rosuvastatin (CRESTOR) 20 MG tablet Take 20 mg by mouth at bedtime. 02/27/20   [provider]  tiotropium (SPIRIVA) 18 MCG inhalation capsule Place 18 mcg into inhaler and inhale daily.    [provider]  topiramate (TOPAMAX) 100 MG tablet Take 100 mg by mouth 2 (two) times daily. 02/02/20   [provider]  triamcinolone cream (KENALOG) 0.5 % Apply 1 application topically daily as needed (rash).    [provider]  Vitamins/Minerals TABS Take 1 tablet by mouth daily.    [provider]      Allergies    Acyclovir and related, Ciprofloxacin, Ciprofloxacin, Penicillins, Promethazine, and Morphine    Review of Systems   Review of Systems  Unable to perform ROS: Patient unresponsive    Physical Exam Updated Vital Signs BP (!) 113/58   Pulse 64   Temp (!) 89.3 F (31.8 C)   Resp (!) 9   Ht 5\' 4"  (1.626 m)   Wt 41.9 kg   SpO2 98%   BMI 15.86 kg/m  Physical Exam Vitals and nursing note reviewed.  Constitutional:      Appearance: She is toxic-appearing.     Interventions: She is intubated. Cervical collar in place.  HENT:     Head: Normocephalic and atraumatic.      Right Ear: External ear normal.     Left Ear: External ear normal.     Nose: Nose normal.     Mouth/Throat:     Mouth: Mucous membranes are dry.  Eyes:     Comments: Pupils fixed and dilated  Neck:     Comments: In c-collar Cardiovascular:     Comments: Initially pulseless Pulmonary:     Effort: She is intubated.     Comments: Bs with bagging only.  No spont. Abdominal:     General: Abdomen is flat. Bowel sounds are normal.     Palpations:  Abdomen is soft.  Musculoskeletal:     Comments: Left leg lac  Skin:    Capillary Refill: Capillary refill takes more than 3 seconds.     Comments: Several lacerations to LLE  Neurological:     Mental Status: She is unresponsive.     GCS: GCS eye subscore is 1. GCS verbal subscore is 1. GCS motor subscore is 1.     ED Results / Procedures / Treatments   Labs (all labs ordered are listed, but only abnormal results are displayed) Labs Reviewed  CBC - Abnormal; Notable for the following components:      Result Value   WBC 19.3 (*)    RBC 2.64 (*)    Hemoglobin 7.8 (*)    HCT 25.4 (*)    RDW 15.6 (*)    nRBC 0.5 (*)    All other components within normal limits  COMPREHENSIVE METABOLIC PANEL - Abnormal; Notable for the following components:   Sodium 125 (*)    Chloride 92 (*)    CO2 13 (*)    Creatinine, Ser 1.42 (*)    Calcium 7.5 (*)    Total Protein 3.6 (*)    Albumin 1.7 (*)    AST 532 (*)    ALT 364 (*)    GFR, Estimated 40 (*)    Anion gap 20 (*)    All other components within normal limits  URINALYSIS, ROUTINE W REFLEX MICROSCOPIC - Abnormal; Notable for the following components:   APPearance CLOUDY (*)    Specific Gravity, Urine >1.030 (*)    Hgb urine dipstick MODERATE (*)    Bilirubin Urine SMALL (*)    Protein, ur 100 (*)    All other components within normal limits  URINALYSIS, MICROSCOPIC (REFLEX) - Abnormal; Notable for the following components:   Bacteria, UA FEW (*)    All other components within normal  limits  I-STAT CHEM 8, ED - Abnormal; Notable for the following components:   Sodium 125 (*)    Chloride 95 (*)    Creatinine, Ser 1.30 (*)    Calcium, Ion 1.04 (*)    TCO2 14 (*)    Hemoglobin 7.5 (*)    HCT 22.0 (*)    All other components within normal limits  I-STAT ARTERIAL BLOOD GAS, ED - Abnormal; Notable for the following components:   pH, Arterial 6.993 (*)    pO2, Arterial 475 (*)    Bicarbonate 9.6 (*)    TCO2 11 (*)    Acid-base deficit 22.0 (*)    Sodium 125 (*)    Calcium, Ion 1.05 (*)    HCT 23.0 (*)    Hemoglobin 7.8 (*)    All other components within normal limits  TROPONIN I (HIGH SENSITIVITY) - Abnormal; Notable for the following components:   Troponin I (High Sensitivity) 366 (*)    All other components within normal limits  TROPONIN I (HIGH SENSITIVITY) - Abnormal; Notable for the following components:   Troponin I (High Sensitivity) 366 (*)    All other components within normal limits  BLOOD GAS, ARTERIAL    EKG EKG Interpretation  Date/Time:  02-01-23 18:48:08 EDT Ventricular Rate:  63 PR Interval:  161 QRS Duration: 121 QT Interval:  474 QTC Calculation: 486 R Axis:   71 Text Interpretation: Sinus rhythm Nonspecific intraventricular conduction delay Anterolateral infarct, age indeterminate t wave inversions are new Confirmed by Isla Pence [phone]) on 2023/02/01 8:41:59 PM  Radiology CT Head Wo  Contrast  Result Date: 2023/01/16 CLINICAL DATA:  Blunt polytrauma. EXAM: CT HEAD WITHOUT CONTRAST CT CERVICAL SPINE WITHOUT CONTRAST TECHNIQUE: Multidetector CT imaging of the head and cervical spine was performed following the standard protocol without intravenous contrast. Multiplanar CT image reconstructions of the cervical spine were also generated. RADIATION DOSE REDUCTION: This exam was performed according to the departmental dose-optimization program which includes automated exposure control, adjustment of the mA and/or kV according  to patient size and/or use of iterative reconstruction technique. COMPARISON:  Head CT 04/05/2018. CTA head and neck and reconstructions 03/22/2018. FINDINGS: CT HEAD FINDINGS Brain: Prior CTA head and neck demonstrated a right MCA/PCA distribution infarct and right ICA occlusion with reconstitution at the terminus as well as a severe right vertebral artery origin stenosis. Since then, there is new demonstration of chronic encephalomalacia consistent with remote infarct over a broad area of the right MCA/PCA distribution. This is seen over portions of the superior left frontal lobe, continuing posterior through the parietal lobe and into the upper to mid right occipital lobe, and over into the posterosuperior right temporal lobe. There is mild ex vacuo expansion of the posterior horn of the right lateral ventricle. There is background moderately advanced cerebral atrophy with atrophic ventriculomegaly with small-vessel disease, mild cerebellar atrophy. There is no definitive finding of an acute cortical based infarct, hemorrhage or positive mass effect. No midline shift. There is a small chronic left basal ganglia lacunar infarct. Basal cisterns are patent. Vascular: No hyperdense central vessel is seen. There is a small chronic calcification in the left sylvian fissure. The V4 left vertebral artery is heavily calcified. There are calcifications in the carotid siphons. Skull: No fracture or focal lesion. Sinuses/Orbits: Orotracheal and oroenteric intubation is noted. There is a fluid level in the left maxillary sinus which could be related to intubation or could indicate unrelated sinusitis. The other paranasal sinuses, bilateral mastoid air cells, and middle ears are clear. Nasal septum deviates to the right. Other: None. CT CERVICAL SPINE FINDINGS Alignment: There is a chronical moderate reversed lordosis, apex C5. Stepwise chronic grade 1 anterolisthesis is unchanged at C2-3, C3-4 and C4-5, with discogenic grade  1 retrolisthesis again at C5-6 and C6-7. There is no new alignment abnormality. Anterior atlantodental joint space loss and osteophytosis is again seen. Skull base and vertebrae: There is osteopenia without evidence of fractures or primary pathologic process. Soft tissues and spinal canal: No prevertebral fluid or swelling. No visible canal hematoma. Again both proximal cervical ICAs are extensively calcified particularly the right, which previously demonstrated occlusion. There was also previously a flow-limiting stenosis on the left. Disc levels: There have been no interval changes. At C2-3 and C3-4 there is mild-to-moderate disc space loss. From C4-5 through T1-2 there is advanced disc collapse with bidirectional osteophytes. The cord is again significantly compressed by dorsal disc osteophyte complexes at C5-6 and C6-7 but this was seen previously. No herniated disc is evident. The other levels demonstrate mild effacement of the ventral thecal sac by disc osteophyte complexes without frank cord compression. There is advanced cervical facet hypertrophy with uncinate spurring and multilevel severe foraminal stenosis, C3-4 through T1-2, worst at C5-6 and C6-7. Upper chest: Emphysematous disease. Other: None. IMPRESSION: 1. No acute intracranial CT findings or depressed skull fractures. 2. Chronic right MCA/PCA distribution infarct with encephalomalacia and ex vacuo expansion of the posterior horn of the right lateral ventricle. 3. Fluid level in the left maxillary sinus which could be related to intubation or could indicate unrelated sinusitis.  4. Osteopenia and degenerative change without evidence of cervical fractures. 5. 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 6. Cord compression at C5-6 and C6-7 due to dorsal disc osteophyte complexes, unchanged. 7. Carotid atherosclerosis. 8. Emphysema. Emphysema (ICD10-J43.9). Electronically Signed   By: Telford Nab M.D.   On:  30-Jan-2023 21:07   CT Cervical Spine Wo Contrast  Result Date: 01-30-2023 CLINICAL DATA:  Blunt polytrauma. EXAM: CT HEAD WITHOUT CONTRAST CT CERVICAL SPINE WITHOUT CONTRAST TECHNIQUE: Multidetector CT imaging of the head and cervical spine was performed following the standard protocol without intravenous contrast. Multiplanar CT image reconstructions of the cervical spine were also generated. RADIATION DOSE REDUCTION: This exam was performed according to the departmental dose-optimization program which includes automated exposure control, adjustment of the mA and/or kV according to patient size and/or use of iterative reconstruction technique. COMPARISON:  Head CT 04/05/2018. CTA head and neck and reconstructions 03/22/2018. FINDINGS: CT HEAD FINDINGS Brain: Prior CTA head and neck demonstrated a right MCA/PCA distribution infarct and right ICA occlusion with reconstitution at the terminus as well as a severe right vertebral artery origin stenosis. Since then, there is new demonstration of chronic encephalomalacia consistent with remote infarct over a broad area of the right MCA/PCA distribution. This is seen over portions of the superior left frontal lobe, continuing posterior through the parietal lobe and into the upper to mid right occipital lobe, and over into the posterosuperior right temporal lobe. There is mild ex vacuo expansion of the posterior horn of the right lateral ventricle. There is background moderately advanced cerebral atrophy with atrophic ventriculomegaly with small-vessel disease, mild cerebellar atrophy. There is no definitive finding of an acute cortical based infarct, hemorrhage or positive mass effect. No midline shift. There is a small chronic left basal ganglia lacunar infarct. Basal cisterns are patent. Vascular: No hyperdense central vessel is seen. There is a small chronic calcification in the left sylvian fissure. The V4 left vertebral artery is heavily calcified. There are  calcifications in the carotid siphons. Skull: No fracture or focal lesion. Sinuses/Orbits: Orotracheal and oroenteric intubation is noted. There is a fluid level in the left maxillary sinus which could be related to intubation or could indicate unrelated sinusitis. The other paranasal sinuses, bilateral mastoid air cells, and middle ears are clear. Nasal septum deviates to the right. Other: None. CT CERVICAL SPINE FINDINGS Alignment: There is a chronical moderate reversed lordosis, apex C5. Stepwise chronic grade 1 anterolisthesis is unchanged at C2-3, C3-4 and C4-5, with discogenic grade 1 retrolisthesis again at C5-6 and C6-7. There is no new alignment abnormality. Anterior atlantodental joint space loss and osteophytosis is again seen. Skull base and vertebrae: There is osteopenia without evidence of fractures or primary pathologic process. Soft tissues and spinal canal: No prevertebral fluid or swelling. No visible canal hematoma. Again both proximal cervical ICAs are extensively calcified particularly the right, which previously demonstrated occlusion. There was also previously a flow-limiting stenosis on the left. Disc levels: There have been no interval changes. At C2-3 and C3-4 there is mild-to-moderate disc space loss. From C4-5 through T1-2 there is advanced disc collapse with bidirectional osteophytes. The cord is again significantly compressed by dorsal disc osteophyte complexes at C5-6 and C6-7 but this was seen previously. No herniated disc is evident. The other levels demonstrate mild effacement of the ventral thecal sac by disc osteophyte complexes without frank cord compression. There is advanced cervical facet hypertrophy with uncinate spurring and multilevel severe foraminal stenosis, C3-4 through T1-2,  worst at C5-6 and C6-7. Upper chest: Emphysematous disease. Other: None. IMPRESSION: 1. No acute intracranial CT findings or depressed skull fractures. 2. Chronic right MCA/PCA distribution infarct  with encephalomalacia and ex vacuo expansion of the posterior horn of the right lateral ventricle. 3. Fluid level in the left maxillary sinus which could be related to intubation or could indicate unrelated sinusitis. 4. Osteopenia and degenerative change without evidence of cervical fractures. 5. 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 6. Cord compression at C5-6 and C6-7 due to dorsal disc osteophyte complexes, unchanged. 7. Carotid atherosclerosis. 8. Emphysema. Emphysema (ICD10-J43.9). Electronically Signed   By: Telford Nab M.D.   On: Feb 14, 2023 21:07   CT Angio Chest PE W and/or Wo Contrast  Result Date: 02-14-2023 CLINICAL DATA:  Blunt polytrauma. Found down in the field reportedly down times 10 minutes before CPR started on scene. EXAM: CT ANGIOGRAPHY CHEST WITH CONTRAST TECHNIQUE: Multidetector CT imaging of the chest was performed using the standard protocol during bolus administration of intravenous contrast. Multiplanar CT image reconstructions and MIPs were obtained to evaluate the vascular anatomy. RADIATION DOSE REDUCTION: This exam was performed according to the departmental dose-optimization program which includes automated exposure control, adjustment of the mA and/or kV according to patient size and/or use of iterative reconstruction technique. CONTRAST:  21mL OMNIPAQUE IOHEXOL 350 MG/ML SOLN COMPARISON:  Portable chest today, portable chest 04/08/2020, CTA chest 04/08/2020. FINDINGS: Support tubes: ETT in place with tip 2.6 cm from the carina. NGT is in place coiled in the proximal stomach, with the tip directed to the right in the gastric body. Cardiovascular: The cardiac size is normal. No pericardial effusion is seen. There are moderate patchy three-vessel coronary artery calcifications. The pulmonary veins are decompressed. The pulmonary trunk is upper limit of normal for caliber. The main pulmonary arteries are normal caliber. No arterial  embolism is seen. There is no expansion of the right heart chambers but there is IVC and hepatic vein contrast reflux, which may be seen with right heart dysfunction or tricuspid regurgitation. There is heavy aortic calcific plaque, beginning in the distal arch continuing distally, scattered soft plaque in the descending segment. There is no aneurysm or dissection. There is additional calcification in the great vessels. There is a high-grade calcific origin stenosis of the left subclavian artery and moderate to severe stenosis continuing to the thoracic outlet. There is additional moderate to severe calcific stenosis of the right subclavian artery. Left common carotid artery is patent with mild-to-moderate calcific plaques. Mediastinum/Nodes: Moderate-sized hiatal hernia extends to the right as before, with fluid level. There is retained or refluxed fluid in the pharynx esophagus. Thyroid gland is heterogeneous but no dominant mass is seen. Postsurgical changes at the EG junction again suggest prior attempted hiatal hernia repair. No tracheal mass is seen. A borderline prominent precarinal mediastinal lymph node is unchanged in the interval. There are no enlarged intrathoracic or axillary lymph nodes. Lungs/Pleura: The lungs are moderately emphysematous with centrilobular changes predominating. Multiple segmental and subsegmental bilateral lower lobe posterior basal impacted bronchi are noted. Small amount of patchy airspace disease noted in the posterior basal left lower lobe consistent with pneumonia or aspiration, with diffuse bronchial thickening. There is a new coarsely nodular opacity in the left lower lobe base on 9:83 measuring 8 mm, additional clustered micronodular densities posteriorly in the right upper lobe up to 5 mm on 9:38, possibly reflecting inflammatory nodules but warranting follow-up. There is a new pleural-based nodule posteriorly in the  right upper lobe measuring 8 mm on 9:29. Calcified  granulomas are again noted in the left upper lobe apex. There are trace pleural effusions. No pneumothorax. Rest of the lung fields are clear. Upper Abdomen: Dense contrast opacification in the IVC and hepatic veins. Streak artifact from the patient's arms and respiratory motion limited evaluation of the abdomen. No obvious acute abnormality. There is dense contrast in the overlying left forearm soft tissues. Clinical correlation for contrast extravasation is recommended. This causes additional spray artifact over the abdomen. Musculoskeletal: There is a mild-to-moderate chronic T12 compression fracture with kyphoplasty cement, unchanged. There is a new mild upper plate anterior wedge compression fracture of T11 of unknown age, potentially recent, loss of anterior height 25% with no posterior height loss or retropulsion. Chronic L1 lower plate compression fracture, stable. No displaced sternal fracture is seen. There is osteopenia. There are displaced fractures with override of the left anterior fourth and right anterior third ribs. There are mildly displaced fractures of the bilateral anterior second ribs, left third rib, right fourth rib, both anterior fifth ribs, anterior right sixth rib and displaced fracture with overriding of the left anterior sixth rib, nondisplaced fractures of both anterior seventh ribs with comminution of the anterior left fifth rib fracture. These rib fractures are most likely due to CPR. No other acute rib fracture is seen. No primary or pathologic process is evident. There is a right shoulder arthroplasty with spray artifact. Review of the MIP images confirms the above findings. IMPRESSION: 1. No evidence of arterial dilatation or embolus. Upper limit of normal pulmonary trunk caliber. 2. IVC and hepatic vein contrast reflux which may be seen with right heart dysfunction or tricuspid regurgitation. 3. Aortic and coronary artery atherosclerosis. 4. Bilateral subclavian artery flow  limiting calcific stenoses. 5. Multiple bilateral anterior rib fractures, most likely due to CPR. Full description above. 6. Mild upper plate anterior wedge compression fracture of T11 of unknown age, potentially recent, loss of anterior height 25% with no posterior height loss or retropulsion. 7. COPD with bilateral lower lobe posterior basal impacted segmental and subsegmental bronchi, with small amount of patchy airspace disease in the posterior basal left lower lobe consistent with pneumonia or aspiration. 8. Trace pleural effusions. 9. New 8 mm right upper lobe pleural-based nodule and 8 mm coarsely nodular opacity in the left lower lobe base. Clustered nodules in the posterior right upper lobe up to 5 mm. Non-contrast chest CT at 3-6 months is recommended. If the nodules are stable at time of repeat CT, then future CT at 18-24 months (from today's scan) is considered optional for low-risk patients, but is recommended for high-risk patients. This recommendation follows the consensus statement: Guidelines for Management of Incidental Pulmonary Nodules Detected on CT Images: From the Fleischner Society 2017; Radiology 2017; 284:228-243. 10. Retained or refluxed fluid in the esophagus with moderate-sized hiatal hernia. 11. Dense contrast in the left forearm soft tissues. Clinical correlation for contrast extravasation is recommended. Aortic Atherosclerosis (ICD10-I70.0). Electronically Signed   By: Telford Nab M.D.   On: 2023-01-23 20:47   DG Tibia/Fibula Left  Result Date: 01/23/23 CLINICAL DATA:  Fall EXAM: LEFT TIBIA AND FIBULA - 2 VIEW COMPARISON:  None Available. FINDINGS: Osseous demineralization. Knee and ankle joint spaces appear narrowed. No acute fracture, dislocation, or bone destruction. Scattered atherosclerotic calcifications. Soft tissue swelling anterolateral mid LEFT lower leg. IMPRESSION: No acute osseous abnormalities. Electronically Signed   By: Lavonia Dana M.D.   On: 01/23/2023 19:58  DG Chest Port 1 View  Result Date: 20-Jan-2023 CLINICAL DATA:  Post cardiac arrest EXAM: PORTABLE CHEST 1 VIEW COMPARISON:  Portable exam 1923 hours compared to 04/08/2020 FINDINGS: Tip of endotracheal tube projects 3.3 cm above carina. Nasogastric tube coiled in proximal stomach. Normal heart size and pulmonary vascularity. Small hiatal hernia. Atherosclerotic calcification aorta. Emphysematous changes without infiltrate, pleural effusion, or pneumothorax. Prior vertebroplasty at T12. IMPRESSION: COPD changes without acute abnormalities. Small hiatal hernia. Aortic Atherosclerosis (ICD10-I70.0) and Emphysema (ICD10-J43.9). Electronically Signed   By: Lavonia Dana M.D.   On: 01-20-23 19:46    Procedures .Central Line  Date/Time: 01/20/2023 7:31 PM  Performed by: Isla Pence, MD Authorized by: Isla Pence, MD   Consent:    Consent obtained:  Emergent situation Universal protocol:    Patient identity confirmed:  Arm band Pre-procedure details:    Indication(s): central venous access, hemodynamic monitoring and insufficient peripheral access     Hand hygiene: Hand hygiene performed prior to insertion     Sterile barrier technique: All elements of maximal sterile technique followed     Skin preparation:  Chlorhexidine   Skin preparation agent: Skin preparation agent completely dried prior to procedure   Sedation:    Sedation type:  None Anesthesia:    Anesthesia method:  None Procedure details:    Location:  R femoral   Patient position:  Supine   Procedural supplies:  Triple lumen   Catheter size:  7 Fr   Landmarks identified: yes     Ultrasound guidance: no     Number of attempts:  1   Successful placement: yes   Post-procedure details:    Post-procedure:  Dressing applied and line sutured   Assessment:  Blood return through all ports and free fluid flow   Procedure completion:  Tolerated well, no immediate complications Procedure Name: Intubation Date/Time: 01-20-23  7:31 PM  Performed by: Isla Pence, MDOxygen Delivery Method: Ambu bag Preoxygenation: Pre-oxygenation with 100% oxygen Ventilation: Mask ventilation without difficulty Laryngoscope Size: Glidescope and 3 Tube size: 7.5 mm Number of attempts: 1 Placement Confirmation: ETT inserted through vocal cords under direct vision, Breath sounds checked- equal and bilateral and Positive ETCO2 Secured at: 23 cm Tube secured with: ETT holder Dental Injury: Teeth and Oropharynx as per pre-operative assessment         Medications Ordered in ED Medications  norepinephrine (LEVOPHED) 4mg  in 265mL (0.016 mg/mL) premix infusion (0 mcg/min Intravenous Stopped 01/20/2023 2156)  EPINEPHrine (ADRENALIN) 5 mg in NS 250 mL (0.02 mg/mL) premix infusion (0 mcg/min Intravenous Stopped 2023-01-20 2157)  cefTRIAXone (ROCEPHIN) 1 g in sodium chloride 0.9 % 100 mL IVPB (1 g Intravenous Not Given Jan 20, 2023 2146)  metroNIDAZOLE (FLAGYL) IVPB 500 mg (500 mg Intravenous Not Given 20-Jan-2023 2146)  sodium chloride 0.9 % bolus 1,000 mL (0 mLs Intravenous Stopped 2023/01/20 2147)  0.9 %  sodium chloride infusion (0 mLs Intravenous Stopped 01-20-23 2018)  iohexol (OMNIPAQUE) 350 MG/ML injection 75 mL (75 mLs Intravenous Contrast Given 01-20-23 2011)  midazolam (VERSED) injection 2 mg (2 mg Intravenous Given 20-Jan-2023 2155)  fentaNYL (SUBLIMAZE) injection 100 mcg (100 mcg Intravenous Given Jan 20, 2023 2156)    ED Course/ Medical Decision Making/ A&P                             Medical Decision Making Amount and/or Complexity of Data Reviewed Labs: ordered. Radiology: ordered.  Risk Prescription drug management. Decision regarding hospitalization.   This  patient presents to the ED for concern of cardiac arrest, this involves an extensive number of treatment options, and is a complaint that carries with it a high risk of complications and morbidity.  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  The pertinent results include:  abg with ph 6.993; ua + hgb and bili, trop elevated at 366, cmp with na low at 125, CO2 low at 13, LFTs elevated, Cr 1.42, CBC with hgb 7.8 (hgb 8.7 on 09/15/22),   Imaging Studies ordered:  I ordered imaging studies including cxr, ct head/c-spine/ct chest/abd/pelvis I independently visualized and interpreted imaging which showed  CXR: COPD changes without acute abnormalities.    Small hiatal hernia.    Aortic Atherosclerosis (ICD10-I70.0) and Emphysema (ICD10-J43.9).  L tib/fib: No acute osseous abnormalities.  CT head/c-spine: No acute intracranial CT findings or depressed skull fractures.  2. Chronic right MCA/PCA distribution infarct with encephalomalacia  and ex vacuo expansion of the posterior horn of the right lateral  ventricle.  3. Fluid level in the left maxillary sinus which could be related to  intubation or could indicate unrelated sinusitis.  4. Osteopenia and degenerative change without evidence of cervical  fractures.  5. 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  6. Cord compression at C5-6 and C6-7 due to dorsal disc osteophyte  complexes, unchanged.  7. Carotid atherosclerosis.  8. Emphysema.    Emphysema (ICD10-J43.9).  CT chest:  No evidence of arterial dilatation or embolus. Upper limit of  normal pulmonary trunk caliber.  2. IVC and hepatic vein contrast reflux which may be seen with right  heart dysfunction or  tricuspid regurgitation.  3. Aortic and coronary artery atherosclerosis.  4. Bilateral subclavian artery flow limiting calcific stenoses.  5. Multiple bilateral anterior rib fractures, most likely due to  CPR. Full description above.  6. Mild upper plate anterior wedge compression fracture of T11 of  unknown age, potentially recent, loss of anterior height 25% with no  posterior height loss or retropulsion.  7. COPD with bilateral lower lobe posterior basal impacted segmental  and subsegmental bronchi, with small amount of patchy airspace  disease in the posterior basal left lower lobe consistent with  pneumonia or aspiration.  8. Trace pleural effusions.  9. New 8 mm right upper lobe pleural-based nodule and 8 mm coarsely  nodular opacity in the left lower lobe base. Clustered nodules in  the posterior right upper lobe up to 5 mm. Non-contrast chest CT at  3-6 months is recommended. If the nodules are stable at time of  repeat CT, then future CT at 18-24 months (from today's scan) is  considered optional for low-risk patients, but is recommended for  high-risk patients. This recommendation follows the consensus  statement: Guidelines for Management of Incidental Pulmonary Nodules  Detected on CT Images: From the Fleischner Society 2017; Radiology  2017; 284:228-243.  10. Retained or refluxed fluid in the esophagus with moderate-sized  hiatal hernia.  11. Dense contrast in the left forearm soft tissues. Clinical  correlation for contrast extravasation is recommended.    I  agree with the radiologist interpretation   Cardiac Monitoring:  The patient was maintained on a cardiac monitor.  I personally viewed and interpreted the cardiac monitored which showed an underlying rhythm of: nsr   Medicines ordered and prescription drug management:   I have reviewed the patients home medicines and have made adjustments as needed   Test Considered:  ct   Critical  Interventions:  Intubation/pressors  Problem List / ED Course:  S/p cardiac arrest:  EMS tube possibly not in correct position or it was moved during transport.  So tube removed, pt bagged and she was re-intubated.  Pt had no gag reflex. Hypotension:  pt requiring levophed and epi drip + IVFs Hypothermia:  pt on bair hugger Aspiration pneumonia:  rocephin/flagyl ordered Code status: Family at bedside.  They have decided to make patient comfort care and withdraw support.  I gave pt versed and fentanyl.  We turned off pressors and took out the tube.  Pt's son at her bedside at time of passing.  TOD 2211.   Reevaluation:  After the interventions noted above, I reevaluated the patient and found that they have :worsened   Social Determinants of Health:  Lives at home with husband   Dispostion:  After consideration of the diagnostic results and the patients response to treatment, I feel that the patent would benefit from admission.    CRITICAL CARE Performed by: Isla Pence   Total critical care time: 90 minutes  Critical care time was exclusive of separately billable procedures and treating other patients.  Critical care was necessary to treat or prevent imminent or life-threatening deterioration.  Critical care was time spent personally by me on the following activities: development of treatment plan with patient and/or surrogate as well as nursing, discussions with consultants, evaluation of patient's response to treatment, examination of patient, obtaining history from patient or surrogate, ordering and performing treatments and interventions, ordering and review of laboratory studies, ordering and review of radiographic studies, pulse oximetry and re-evaluation of patient's condition.         Final Clinical Impression(s) / ED Diagnoses Final diagnoses:  Cardiac arrest ([place])  Hypothermia, initial encounter  Aspiration pneumonia, unspecified aspiration pneumonia type,  unspecified laterality, unspecified part of lung ([place])  Chronic anemia  Hyponatremia  Transaminitis  Hypotension, unspecified hypotension type  Closed fracture of multiple ribs of both sides, initial encounter    Rx / DC Orders ED Discharge Orders     None         Isla Pence, MD 01/17/23 2235

## 2023-01-24 NOTE — ED Notes (Signed)
Patient te,mp 63 f , patient place under bear hugger

## 2023-01-24 NOTE — ED Notes (Signed)
EDP [place] inserting central line.

## 2023-01-24 NOTE — ED Notes (Signed)
Time of Death: 2211  2 RN verify.

## 2023-01-24 NOTE — ED Triage Notes (Signed)
Pt BIB [facility] from home CPR in progress, witnessed arrest by family, had fallen earlier & has a scratch to upper Rt arm below shoulder. Upon arrival to ED Compressions were ongoing, zole pads were switched over, pulse was not palpated so compressions were continued. EMS reports on the field pt had a total 10 min down time before CPR was started on scene, 4 total arrests & 6 total Epi's were given last EPI given at [place] (pt arrived at [place]). A-fib RVR was noted on EMS monitor at various times when there was a pulse. 7 ET, pressure varied from 100/60 to 60/40, 1 L NS, 2 Io's & 20g Lt wrist.

## 2023-01-24 NOTE — Progress Notes (Signed)
RT NOTE:  Family has decided on comfort care. RT was unavailable at that time. Dr. Gilford Raid withdrew ETT.

## 2023-01-24 NOTE — Code Documentation (Signed)
7.5 ET, 24 @ lip.

## 2023-01-24 NOTE — Code Documentation (Signed)
EDP [place] palpated a pulse & BP was unable to obtain a BP, pressure fluids getting primed.

## 2023-01-24 NOTE — ED Notes (Signed)
Family have decided  for patient to be extubated and have comfort care only. Meds are dc

## 2023-01-24 NOTE — Code Documentation (Signed)
Family arrived & was directed to consultation.

## 2023-01-24 NOTE — Progress Notes (Signed)
RT NOTE:  Pt transported to CT and back to ER without event 

## 2023-01-24 DEATH — deceased

## 2023-01-26 ENCOUNTER — Encounter: Payer: Medicare Other | Admitting: Occupational Therapy

## 2023-02-02 ENCOUNTER — Encounter: Payer: Medicare Other | Admitting: Occupational Therapy

## 2023-02-09 ENCOUNTER — Encounter: Payer: Medicare Other | Admitting: Occupational Therapy
